# Patient Record
Sex: Male | Born: 1945
Health system: Southern US, Community
[De-identification: ages and names within clinical notes are randomized; demographics above are authoritative.]

## PROBLEM LIST (undated history)

## (undated) DIAGNOSIS — R0602 Shortness of breath: Secondary | ICD-10-CM

## (undated) DIAGNOSIS — I10 Essential (primary) hypertension: Secondary | ICD-10-CM

## (undated) DIAGNOSIS — H918X9 Other specified hearing loss, unspecified ear: Secondary | ICD-10-CM

## (undated) DIAGNOSIS — R112 Nausea with vomiting, unspecified: Secondary | ICD-10-CM

## (undated) DIAGNOSIS — IMO0001 Reserved for inherently not codable concepts without codable children: Secondary | ICD-10-CM

## (undated) DIAGNOSIS — Z9861 Coronary angioplasty status: Secondary | ICD-10-CM

## (undated) DIAGNOSIS — K5792 Diverticulitis of intestine, part unspecified, without perforation or abscess without bleeding: Secondary | ICD-10-CM

## (undated) DIAGNOSIS — K219 Gastro-esophageal reflux disease without esophagitis: Secondary | ICD-10-CM

## (undated) DIAGNOSIS — G8929 Other chronic pain: Secondary | ICD-10-CM

## (undated) DIAGNOSIS — N411 Chronic prostatitis: Secondary | ICD-10-CM

## (undated) DIAGNOSIS — R51 Headache: Secondary | ICD-10-CM

## (undated) DIAGNOSIS — Z5189 Encounter for other specified aftercare: Secondary | ICD-10-CM

## (undated) DIAGNOSIS — R519 Headache, unspecified: Secondary | ICD-10-CM

## (undated) DIAGNOSIS — Z9889 Other specified postprocedural states: Secondary | ICD-10-CM

## (undated) DIAGNOSIS — R319 Hematuria, unspecified: Secondary | ICD-10-CM

## (undated) DIAGNOSIS — I2510 Atherosclerotic heart disease of native coronary artery without angina pectoris: Secondary | ICD-10-CM

## (undated) DIAGNOSIS — C801 Malignant (primary) neoplasm, unspecified: Secondary | ICD-10-CM

## (undated) DIAGNOSIS — M1990 Unspecified osteoarthritis, unspecified site: Secondary | ICD-10-CM

## (undated) DIAGNOSIS — K458 Other specified abdominal hernia without obstruction or gangrene: Secondary | ICD-10-CM

## (undated) HISTORY — DX: Headache: R51

## (undated) HISTORY — DX: Hematuria, unspecified: R31.9

## (undated) HISTORY — DX: Other chronic pain: G89.29

## (undated) HISTORY — DX: Headache, unspecified: R51.9

## (undated) HISTORY — DX: Nausea with vomiting, unspecified: R11.2

## (undated) HISTORY — DX: Other specified abdominal hernia without obstruction or gangrene: K45.8

## (undated) HISTORY — PX: EYE SURGERY: SHX253

## (undated) HISTORY — PX: CHOLECYSTECTOMY: SHX55

## (undated) HISTORY — DX: Chronic prostatitis: N41.1

## (undated) HISTORY — PX: RECONSTRUCTION MID-FACE: SUR1085

## (undated) HISTORY — DX: Diverticulitis of intestine, part unspecified, without perforation or abscess without bleeding: K57.92

## (undated) HISTORY — DX: Other specified hearing loss, unspecified ear: H91.8X9

## (undated) HISTORY — PX: FRACTURE SURGERY: SHX138

---

## 2001-07-09 ENCOUNTER — Ambulatory Visit (HOSPITAL_COMMUNITY): Admission: RE | Admit: 2001-07-09 | Discharge: 2001-07-09 | Payer: Self-pay | Admitting: Pulmonary Disease

## 2003-12-15 ENCOUNTER — Inpatient Hospital Stay (HOSPITAL_COMMUNITY): Admission: EM | Admit: 2003-12-15 | Discharge: 2003-12-18 | Payer: Self-pay | Admitting: Emergency Medicine

## 2006-03-12 ENCOUNTER — Inpatient Hospital Stay (HOSPITAL_COMMUNITY): Admission: EM | Admit: 2006-03-12 | Discharge: 2006-03-15 | Payer: Self-pay | Admitting: *Deleted

## 2009-03-12 ENCOUNTER — Ambulatory Visit (HOSPITAL_COMMUNITY): Admission: RE | Admit: 2009-03-12 | Discharge: 2009-03-12 | Payer: Self-pay | Admitting: Pulmonary Disease

## 2009-10-13 ENCOUNTER — Ambulatory Visit (HOSPITAL_COMMUNITY): Admission: RE | Admit: 2009-10-13 | Discharge: 2009-10-13 | Payer: Self-pay | Admitting: Pulmonary Disease

## 2010-08-16 LAB — CREATININE, SERUM: GFR calc non Af Amer: >60 mL/min (ref >60)

## 2010-10-15 NOTE — Group Therapy Note (Signed)
NAME:  Brandon [name], Brandon [name]                         ACCOUNT NO.:  [id number]   MEDICAL RECORD NO.:  [id number]                   PATIENT TYPE:  INP   LOCATION:  A321                                 FACILITY:  [facility]   PHYSICIAN:  Edward L. Juanetta Gosling, M.D.             DATE OF BIRTH:  [birth year]   DATE OF PROCEDURE:  12/17/2003  DATE OF DISCHARGE:                                   PROGRESS NOTE   Brandon [name] says he feels much better this morning.  He has no headache, no  nausea or vomiting, and no chest or abdominal discomfort.  His temperature  is 97.9 degrees, pulse 78, respirations 16, blood pressure 112/75, and O2  saturation 95% on room air.  His chest is much clearer.  His abdomen is very  soft.  White count 12,900, hemoglobin 13.2.   ASSESSMENT:  He is much improved.   PLAN:  Continue with his treatments and medications.  No changes today.  I  am going to plan to check him to a heparin lock, get him up and moving  around, and then hopefully he will be able to be discharged possibly  tomorrow.  He will need a cardiac workup as an outpatient.  I do not think  he has cardiac disease and he is acutely ill.  His family is requesting an  EGD and a colonoscopy.  I think he needs to have those done, but probably  could get those done as an outpatient as well.      ___________________________________________                                            Oneal Deputy. Juanetta Gosling, M.D.   Gwenlyn Found  D:  12/17/2003  T:  12/17/2003  Job:  [id number]

## 2010-10-15 NOTE — Discharge Summary (Signed)
NAME:  Brandon [name], Brandon [name]                         ACCOUNT NO.:  [id number]   MEDICAL RECORD NO.:  [id number]                   PATIENT TYPE:  INP   LOCATION:  A321                                 FACILITY:  [facility]   PHYSICIAN:  Edward L. Juanetta Gosling, M.D.             DATE OF BIRTH:  [birth year]   DATE OF ADMISSION:  12/15/2003  DATE OF DISCHARGE:  12/18/2003                                 DISCHARGE SUMMARY   FINAL DISCHARGE DIAGNOSES:  1. Abdominal pain, etiology unclear.  2. Prostatitis with Enterococcus coli infection.   HISTORY:  This is a 65 year old male who came after a one day history of  abdominal pain, nausea and vomiting.  He said that he had been feeling  pretty well up until the day prior to admission, at which time he developed  increasing difficulties with the above symptoms and came to the emergency  room.  He actually came by my office, told us he was having chest pain and  was told to go to the emergency room because of the chest pain.  When he was  seen in the emergency room it was felt that his pain was actually more in  his abdomen than in his chest and he appeared to be uncomfortable diffusely  in his abdomen.  His family states that they have taken multiple ticks off  of him and it is not clear if this may be some sort of a tick born illness,  but he has had no rash.   PHYSICAL EXAMINATION:  GENERAL:  He appeared to have some abdominal  discomfort.  CHEST:  Clear.  HEART:  Regular.  ABDOMEN:  Soft, except as mentioned.  EXTREMITIES:  No edema.  CNS:  Grossly intact.   HOSPITAL COURSE:  His white count was noted to be about 20,000.  He had  [facility] Spotted Fever and Lyme titers that are pending.  He had  consultation with Dr. Karilyn Cota.  Culture of his urine showed E. coli.  He was  started on doxycycline because of the concern about a tick born illness and  was given Protonix.  He does have a history of gastroesophageal reflux  disease, but has not been taking  his medications regularly.  He improved  greatly over the next 48 hours.  His white blood count came back to normal.  He had no further severe abdominal pain.  It was felt that he would need to  be worked up as an outpatient with a colonoscopy, simply because he is over  50.  Also, probably an EGD.  He is going to need a cardiac evaluation, I  believe, but all of these need to be done when he is not acutely ill.  I  have explained that to the patient and his wife and they understand.   At this point he is going to be discharged home in improved condition on:  1. Levaquin 500  mg daily times seven days.  2. Doxycycline 100 mg b.i.d. times seven days.   He is going to follow-up in my office in about three days.  He is also going  to follow-up with Dr. Karilyn Cota for further testing of his gastrointestinal  tracts and I will make an appointment to see a cardiologist at some point as  well.   FINAL DISCHARGE DIAGNOSES:  1. Abdominal discomfort, etiology unclear, possibly related to     gastroesophageal reflux disease.  2. Gastroesophageal reflux disease.  3. Enterococcus coli urinary tract infection.     ___________________________________________                                         Oneal Deputy Juanetta Gosling, M.D.   ELH/MEDQ  D:  12/18/2003  T:  12/18/2003  Job:  [id number]

## 2010-10-15 NOTE — H&P (Signed)
NAME:  Brandon [name], Brandon [name] NO.:  [id number]   MEDICAL RECORD NO.:  [id number]          PATIENT TYPE:  INP   LOCATION:  A320                          FACILITY:  [facility]   PHYSICIAN:  Ky Barban, M.D.DATE OF BIRTH:  1946/02/28   DATE OF ADMISSION:  03/12/2006  DATE OF DISCHARGE:  LH                                HISTORY & PHYSICAL   CHIEF COMPLAINT:  Right testicle swelling and pain.   HISTORY OF PRESENT ILLNESS:  A 65 year old male presented to the emergency  room with pain in the right lower quadrant going into the right testicle  which is swollen and tender and low-grade fever with nausea and vomiting.  He came to the emergency room.  He is admitted with the diagnosis of right  epididymitides.  Testicle ultrasound confirms that.  He has no other  urological complaints.  Never had any kidney stones, and 25 years ago he  says he had a similar episode.   PAST MEDICAL HISTORY:  No history of diabetes or hypertension.   MEDICATIONS:  None.   FAMILY HISTORY:  No history of prostate cancer.   SOCIAL HISTORY:  He does not smoke or drink.   REVIEW OF SYSTEMS:  Unremarkable,   PHYSICAL EXAMINATION:  GENERAL:  Well-nourished, well-developed male in no  acute distress.  Fully conscious, alert, oriented.  VITAL SIGNS:  Blood pressure 130/80, temperature 100.  CNS:  No gross neurological deficits.  HEENT:  Symmetrical.  HEART:  Regular sinus rhythm.  No murmur.  ABDOMEN:  Soft, flat.  Liver, spleen, kidneys are not palpable.  No CV  tenderness.  EXTERNAL GENITALIA:  Uncircumcised.  Meatus is adequate.  Left testicle is  normal.  Right hemiscrotum is red, tender, swollen, edematous.  RECTAL:  Deferred.   IMPRESSION:  Right acute epididymitides.   RECOMMENDATIONS:  Recommend IV fluids and start him on __________ 1 g IV  b.i.d.  He cannot take Cipro and Levaquin makes him nauseated.  Urine  culture is done.  Will do CBC in the morning.  I informed the patient and  the family that this may take several weeks for this problem to settle.  As  soon as he is comfortable, we will discharge him home.  I do want to get CT  scan of the urinary abdomen/pelvis to make sure there is no pathology in the  urinary tract.      Ky Barban, M.D.  Electronically Signed     MIJ/MEDQ  D:  03/12/2006  T:  03/13/2006  Job:  [id number]
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
## 2010-10-15 NOTE — Group Therapy Note (Signed)
NAME:  Brandon [name], Brandon [name]                         ACCOUNT NO.:  [id number]   MEDICAL RECORD NO.:  [id number]                   PATIENT TYPE:  INP   LOCATION:  A321                                 FACILITY:  [facility]   PHYSICIAN:  Edward L. Juanetta Gosling, M.D.             DATE OF BIRTH:  [birth year]   DATE OF PROCEDURE:  12/18/2003  DATE OF DISCHARGE:  12/18/2003                                   PROGRESS NOTE   PROBLEM:  Abdominal discomfort, prostatitis, possible tic born disease.   SUBJECTIVE:  [name] says that he feels great and wants to go home. He  has not had any more abdominal discomfort. He is not having any fever and he  says that he feels very well and has no problems now.   PHYSICAL EXAMINATION:  CHEST:  Clear.  HEART:  Regular.  ABDOMEN:  Soft.  VITAL SIGNS:  Temperature 99.3, blood pressure 125/75, heart rate 70,  respiratory rate 20.   ASSESSMENT:  Overall, I think he is much better.   PLAN:  For him to be discharged home. Please see discharge summary for  details.      ___________________________________________                                            Oneal Deputy Juanetta Gosling, M.D.   ELH/MEDQ  D:  12/18/2003  T:  12/19/2003  Job:  [id number]

## 2010-10-15 NOTE — Discharge Summary (Signed)
NAME:  Brandon [name], Brandon [name] NO.:  [id number]   MEDICAL RECORD NO.:  [id number]          PATIENT TYPE:  INP   LOCATION:  A320                          FACILITY:  [facility]   PHYSICIAN:  Ky Barban, M.D.DATE OF BIRTH:  06-22-1945   DATE OF ADMISSION:  03/12/2006  DATE OF DISCHARGE:  10/17/2007LH                               DISCHARGE SUMMARY   This 64 year old gentleman presented in the emergency room with right  testicular swelling and pain and he was admitted with the diagnosis of  acute right epididymal orchitis.  A testicular ultrasound was done that  confirmed the diagnosis.  Patient has no other medical problem.  He was  admitted.  Routine admission workup was done.  Lab Data:  WBC count is  18.7, hematocrit is 41.9, sodium 130, potassium 3.6, chloride 96, CO2 is  20, BUN is 26, creatinine 1.3.  A urinalysis shows 7-10 WBCs, 3-6 RBCs.  Urine culture showed no growth but he was admitted and started on IV  fluids and IV Levaquin.  On admission his temperature was 102.6 and the  temperature gradually came down.  He started to feel better.  An  abdominal and pelvic CT was also done to check the upper urinary tract.  Except some inflammatory changes around the kidneys, there is no other  pathology in the urinary tract.  He started to feel better so I am going  to discharge him home.   FINAL DISCHARGE DIAGNOSIS:  Right epididymal orchitis.   He is advised to continue taking his Nexium and he is not supposed to  lift more than 20 pounds.  Gave him prescription for Bactrim DS one  b.i.d. for 10 days and morphine 30 mg p.o. q.6h. p.r.n. #30.  He is  supposed to come back and see me in the office in 2 weeks.      Ky Barban, M.D.  Electronically Signed     MIJ/MEDQ  D:  04/25/2006  T:  04/25/2006  Job:  [id number]

## 2011-04-07 ENCOUNTER — Encounter (HOSPITAL_COMMUNITY): Payer: Self-pay | Admitting: Pharmacy Technician

## 2011-04-07 NOTE — H&P (Signed)
NTS SOAP Note  Vital Signs:  Vitals as of: 04/07/2011: Systolic 117: Diastolic 77: Heart Rate 73: Temp 96.35F: Height 71ft 7in: Weight 174Lbs 0 Ounces: Pain Level 0: BMI 27  BMI : 27.25 kg/m2  Subjective: This 65 Years 2 Months old Male presents for of HERNIA: ,Has had a right inguinal hernia for over one year, but recently has increased in size and is causing him discomfort. No nausea, vomiting.  Review of Symptoms:  Constitutional: unremarkable  Head: unremarkable  Eyes:unremarkable  Nose/Mouth/Throat:unremarkable  Cardiovascular:unremarkable  Respiratory: unremarkable  Gastrointestinal:unremarkable  Genitourinary: unremarkable  Musculoskeletal: unremarkable  Skin: unremarkable  Hematolgic/Lymphatic: unremarkable  Allergic/Immunologic:unremarkable    Past Medical History:Reviewed  Past Medical History  Surgical History: lap cholecystectomy Medical Problems: High Blood pressure Allergies: nkda Medications: a BP pill, nexium   Social History: Reviewed   Social History  Preferred Language: English ([place]) Race: White Ethnicity: Not Hispanic / Latino Age: 65 Years 2 Months Marital Status: M Alcohol: No Recreational drug(s): No   Smoking Status: Never smoker reviewed on 04/07/2011  Family History: Reviewed  Family History  Is there a family history QI:ONGEXBMW, CAD    Objective Information:  General: Well appearing, well nourished in no distress.  Skin:no rash or prominent lesions  Head: Atraumatic; no masses; no abnormalities  Heart: RRR, no murmur or gallop. Normal S1, S2. No S3, S4.  Lungs:CTA bilaterally, no wheezes, rhonchi, rales. Breathing unlabored.  Abdomen: Soft,ND, normal bowel sounds, no HSM, no masses. No peritoneal signs. Reducible right inguinal hernia, weak left inguinal floor. Possible right flank hernia noted.  WNL  Assessment: Right inguinal hernia  Diagnosis & Procedure: DiagnosisCode: 550.90, ProcedureCode: 41324,     Plan:Scheduled for right inguinal herniorrhaphy 0n 04/13/11.   Patient Education: Alternative treatments to surgery were discussed with patient (and family).Risks and benefits of procedure were fully explained to the patient (and family) who gave informed consent. Patient/family questions were addressed.  Follow-up: Pending Surgery

## 2011-04-11 ENCOUNTER — Other Ambulatory Visit: Payer: Self-pay

## 2011-04-11 ENCOUNTER — Encounter (HOSPITAL_COMMUNITY): Payer: Self-pay

## 2011-04-11 ENCOUNTER — Encounter (HOSPITAL_COMMUNITY)
Admission: RE | Admit: 2011-04-11 | Discharge: 2011-04-11 | Disposition: A | Discharge status: Home / Self Care | Payer: Medicare Other | Source: Ambulatory Visit | Attending: General Surgery | Admitting: General Surgery

## 2011-04-11 HISTORY — DX: Gastro-esophageal reflux disease without esophagitis: K21.9

## 2011-04-11 HISTORY — DX: Shortness of breath: R06.02

## 2011-04-11 HISTORY — DX: Unspecified osteoarthritis, unspecified site: M19.90

## 2011-04-11 HISTORY — DX: Encounter for other specified aftercare: Z51.89

## 2011-04-11 HISTORY — DX: Reserved for inherently not codable concepts without codable children: IMO0001

## 2011-04-11 HISTORY — DX: Other specified postprocedural states: Z98.890

## 2011-04-11 HISTORY — DX: Nausea with vomiting, unspecified: R11.2

## 2011-04-11 HISTORY — DX: Essential (primary) hypertension: I10

## 2011-04-11 LAB — DIFFERENTIAL
Basophils Absolute: 0 10*3/uL (ref 0.0–0.1)
Basophils Relative: 0 % (ref 0–1)
Eosinophils Relative: 1 % (ref 0–5)
Lymphocytes Relative: 30 % (ref 12–46)
Monocytes Absolute: 0.4 10*3/uL (ref 0.1–1.0)

## 2011-04-11 LAB — BASIC METABOLIC PANEL
Calcium: 9.7 mg/dL (ref 8.4–10.5)
GFR calc Af Amer: 71 mL/min — ABNORMAL LOW (ref >90)
GFR calc non Af Amer: 61 mL/min — ABNORMAL LOW (ref >90)
Glucose, Bld: 135 mg/dL — ABNORMAL HIGH (ref 70–99)
Potassium: 4.5 mEq/L (ref 3.5–5.1)
Sodium: 137 mEq/L (ref 135–145)

## 2011-04-11 LAB — CBC
HCT: 40.4 % (ref 39.0–52.0)
MCH: 30 pg (ref 26.0–34.0)
MCHC: 33.7 g/dL (ref 30.0–36.0)
MCV: 89.2 fL (ref 78.0–100.0)
Platelets: 219 10*3/uL (ref 150–400)
RBC: 4.53 MIL/uL (ref 4.22–5.81)
WBC: 7.6 10*3/uL (ref 4.0–10.5)

## 2011-04-11 NOTE — Patient Instructions (Addendum)
20 Brandon [name]  04/11/2011   Your procedure is scheduled on:  04/13/2011  Report to [facility] at 800  AM.  Call this number if you have problems the morning of surgery: [phone]   Remember:   Do not eat food:After Midnight.  Do not drink clear liquids: After Midnight.  Take these medicines the morning of surgery with A SIP OF WATER: hyzaar,Prilosec   Do not wear jewelry, make-up or nail polish.  Do not wear lotions, powders, or perfumes. You may wear deodorant.  Do not shave 48 hours prior to surgery.  Do not bring valuables to the hospital.  Contacts, dentures or bridgework may not be worn into surgery.  Leave suitcase in the car. After surgery it may be brought to your room.  For patients admitted to the hospital, checkout time is 11:00 AM the day of discharge.   Patients discharged the day of surgery will not be allowed to drive home.  Name and phone number of your driver: son  Special Instructions: CHG Shower Use Special Wash: 1/2 bottle night before surgery and 1/2 bottle morning of surgery.   Please read over the following fact sheets that you were given: Pain Booklet, MRSA Information, Surgical Site Infection Prevention, Anesthesia Post-op Instructions and Care and Recovery After Surgery Hernia, Surgical Repair A hernia occurs when an internal organ pushes out through a weak spot in the belly (abdominal) wall muscles. Hernias commonly occur in the groin and around the navel. Hernias often can be pushed back into place (reduced). Most hernias tend to get worse over time. Problems occur when abdominal contents get stuck in the opening (incarcerated hernia). The blood supply gets cut off (strangulated hernia). This is an emergency and needs surgery. Otherwise, hernia repair can be an elective procedure. This means you can schedule this at your convenience when an emergency is not present. Because complications can occur, if you decide to repair the hernia, it is best to do it soon.  When it becomes an emergency procedure, there is increased risk of complications after surgery. CAUSES   Heavy lifting.   Obesity.   Prolonged coughing.   Straining to move your bowels.   Hernias can also occur through a cut (incision) by a surgeonafter an abdominal operation.  HOME CARE INSTRUCTIONS Before the repair:  Bed rest is not required. You may continue your normal activities, but avoid heavy lifting (more than 10 pounds) or straining. Cough gently. If you are a smoker, it is best to stop. Even the best hernia repair can break down with the continual strain of coughing.   Do not wear anything tight over your hernia. Do not try to keep it in with an outside bandage or truss. These can damage abdominal contents if they are trapped in the hernia sac.   Eat a normal diet. Avoid constipation. Straining over long periods of time to have a bowel movement will increase hernia size. It also can breakdown repairs. If you cannot do this with diet alone, laxatives or stool softeners may be used.  PRIOR TO SURGERY, SEEK IMMEDIATE MEDICAL CARE IF: You have problems (symptoms) of a trapped (incarcerated) hernia. Symptoms include:  An oral temperature above 102 F (38.9 C) develops, or as your caregiver suggests.   Increasing abdominal pain.   Feeling sick to your stomach(nausea) and vomiting.   You stop passing gas or stool.   The hernia is stuck outside the abdomen, looks discolored, feels hard, or is tender.   You  have any changes in your bowel habits or in the hernia that is unusual for you.  LET YOUR CAREGIVERS KNOW ABOUT THE FOLLOWING:  Allergies.   Medications taken including herbs, eye drops, over the counter medications, and creams.   Use of steroids (by mouth or creams).   Family or personal history of problems with anesthetics or Novocaine.   Possibility of pregnancy, if this applies.   Personal history of blood clots (thrombophlebitis).   Family or personal  history of bleeding or blood problems.   Previous surgery.   Other health problems.  BEFORE THE PROCEDURE You should be present 1 hour prior to your procedure, or as directed by your caregiver.  AFTER THE PROCEDURE After surgery, you will be taken to the recovery area. A nurse will watch and check your progress there. Once you are awake, stable, and taking fluids well, you will be allowed to go home as long as there are no problems. Once home, an ice pack (wrapped in a light towel) applied to your operative site may help with discomfort. It may also keep the swelling down. Do not lift anything heavier than 10 pounds (4.55 kilograms). Take showers not baths. Do not drive while taking narcotics. Follow instructions as suggested by your caregiver.  SEEK IMMEDIATE MEDICAL CARE IF: After surgery:  There is redness, swelling, or increasing pain in the wound.   There is pus coming from the wound.   There is drainage from a wound lasting longer than 1 day.   An unexplained oral temperature above 102 F (38.9 C) develops.   You notice a foul smell coming from the wound or dressing.   There is a breaking open of a wound (edged not staying together) after the sutures have been removed.   You notice increasing pain in the shoulders (shoulder strap areas).   You develop dizzy episodes or fainting while standing.   You develop persistent nausea or vomiting.   You develop a rash.   You have difficulty breathing.   You develop any reaction or side effects to medications given.  MAKE SURE YOU:   Understand these instructions.   Will watch your condition.   Will get help right away if you are not doing well or get worse.  Document Released: 11/09/2000 Document Revised: 01/26/2011 Document Reviewed: 10/02/2007 [facility] Patient Information 2012 [place], [place].PATIENT INSTRUCTIONS POST-ANESTHESIA  IMMEDIATELY FOLLOWING SURGERY:  Do not drive or operate machinery for the first twenty four  hours after surgery.  Do not make any important decisions for twenty four hours after surgery or while taking narcotic pain medications or sedatives.  If you develop intractable nausea and vomiting or a severe headache please notify your doctor immediately.  FOLLOW-UP:  Please make an appointment with your surgeon as instructed. You do not need to follow up with anesthesia unless specifically instructed to do so.  WOUND CARE INSTRUCTIONS (if applicable):  Keep a dry clean dressing on the anesthesia/puncture wound site if there is drainage.  Once the wound has quit draining you may leave it open to air.  Generally you should leave the bandage intact for twenty four hours unless there is drainage.  If the epidural site drains for more than 36-48 hours please call the anesthesia department.  QUESTIONS?:  Please feel free to call your physician or the hospital operator if you have any questions, and they will be happy to assist you.     [facility] Anesthesia Department [address] [place] [phone]

## 2011-04-13 ENCOUNTER — Encounter (HOSPITAL_COMMUNITY): Payer: Self-pay | Admitting: Anesthesiology

## 2011-04-13 ENCOUNTER — Encounter (HOSPITAL_COMMUNITY)
Admission: RE | Disposition: A | Discharge status: Home / Self Care | Payer: Self-pay | Source: Ambulatory Visit | Attending: General Surgery

## 2011-04-13 ENCOUNTER — Ambulatory Visit (HOSPITAL_COMMUNITY): Payer: Medicare Other | Admitting: Anesthesiology

## 2011-04-13 ENCOUNTER — Encounter (HOSPITAL_COMMUNITY): Payer: Self-pay | Admitting: *Deleted

## 2011-04-13 ENCOUNTER — Other Ambulatory Visit: Payer: Self-pay

## 2011-04-13 ENCOUNTER — Ambulatory Visit (HOSPITAL_COMMUNITY)
Admission: RE | Admit: 2011-04-13 | Discharge: 2011-04-14 | Disposition: A | Discharge status: Home / Self Care | Payer: Medicare Other | Source: Ambulatory Visit | Attending: General Surgery | Admitting: General Surgery

## 2011-04-13 DIAGNOSIS — R55 Syncope and collapse: Secondary | ICD-10-CM | POA: Insufficient documentation

## 2011-04-13 DIAGNOSIS — I10 Essential (primary) hypertension: Secondary | ICD-10-CM | POA: Insufficient documentation

## 2011-04-13 DIAGNOSIS — K4090 Unilateral inguinal hernia, without obstruction or gangrene, not specified as recurrent: Secondary | ICD-10-CM | POA: Insufficient documentation

## 2011-04-13 DIAGNOSIS — K469 Unspecified abdominal hernia without obstruction or gangrene: Secondary | ICD-10-CM

## 2011-04-13 DIAGNOSIS — Z01812 Encounter for preprocedural laboratory examination: Secondary | ICD-10-CM | POA: Insufficient documentation

## 2011-04-13 DIAGNOSIS — Z79899 Other long term (current) drug therapy: Secondary | ICD-10-CM | POA: Insufficient documentation

## 2011-04-13 HISTORY — PX: INGUINAL HERNIA REPAIR: SHX194

## 2011-04-13 SURGERY — REPAIR, HERNIA, INGUINAL, ADULT
Anesthesia: Spinal | Site: Groin | Laterality: Right | Wound class: Clean

## 2011-04-13 MED ORDER — FENTANYL CITRATE 0.05 MG/ML IJ SOLN
25.0000 ug | INTRAMUSCULAR | Status: DC | PRN
  Administered 2011-04-13: 25 ug via INTRAVENOUS
  Administered 2011-04-13: 50 ug via INTRAVENOUS

## 2011-04-13 MED ORDER — FENTANYL CITRATE 0.05 MG/ML IJ SOLN
INTRAMUSCULAR | Status: DC | PRN
  Administered 2011-04-13: 12.5 ug via INTRAVENOUS

## 2011-04-13 MED ORDER — LACTATED RINGERS IV SOLN
INTRAVENOUS | Status: DC
  Administered 2011-04-13: 17:00:00 via INTRAVENOUS
  Administered 2011-04-13: 125 mL/h via INTRAVENOUS
  Administered 2011-04-14: 11:00:00 via INTRAVENOUS
  Administered 2011-04-14: 1000 via INTRAVENOUS

## 2011-04-13 MED ORDER — FENTANYL CITRATE 0.05 MG/ML IJ SOLN
INTRAMUSCULAR | Status: AC
  Administered 2011-04-13: 25 ug via INTRAVENOUS
  Filled 2011-04-13: qty 2

## 2011-04-13 MED ORDER — FENTANYL CITRATE 0.05 MG/ML IJ SOLN
INTRAMUSCULAR | Status: AC
  Administered 2011-04-13: 50 ug via INTRAVENOUS
  Filled 2011-04-13: qty 2

## 2011-04-13 MED ORDER — DEXAMETHASONE SODIUM PHOSPHATE 4 MG/ML IJ SOLN
INTRAMUSCULAR | Status: AC
  Administered 2011-04-13: 4 mg via INTRAVENOUS
  Filled 2011-04-13: qty 1

## 2011-04-13 MED ORDER — ONDANSETRON HCL 4 MG/2ML IJ SOLN: 4.0000 mg | Freq: Four times a day (QID) | INTRAMUSCULAR | Status: DC | PRN

## 2011-04-13 MED ORDER — BUPIVACAINE HCL (PF) 0.5 % IJ SOLN
INTRAMUSCULAR | Status: AC
  Filled 2011-04-13: qty 30

## 2011-04-13 MED ORDER — BUPIVACAINE HCL (PF) 0.5 % IJ SOLN
INTRAMUSCULAR | Status: DC | PRN
  Administered 2011-04-13: 10 mL

## 2011-04-13 MED ORDER — MORPHINE SULFATE 2 MG/ML IJ SOLN
2.0000 mg | INTRAMUSCULAR | Status: DC | PRN
  Administered 2011-04-13: 2 mg via INTRAVENOUS
  Filled 2011-04-13: qty 1

## 2011-04-13 MED ORDER — BUPIVACAINE IN DEXTROSE 0.75-8.25 % IT SOLN
INTRATHECAL | Status: AC
  Filled 2011-04-13: qty 2

## 2011-04-13 MED ORDER — EPHEDRINE SULFATE 50 MG/ML IJ SOLN
INTRAMUSCULAR | Status: AC
  Filled 2011-04-13: qty 1

## 2011-04-13 MED ORDER — DEXAMETHASONE SODIUM PHOSPHATE 4 MG/ML IJ SOLN
4.0000 mg | INTRAMUSCULAR | Status: AC
  Administered 2011-04-13: 4 mg via INTRAVENOUS

## 2011-04-13 MED ORDER — LACTATED RINGERS IV SOLN
INTRAVENOUS | Status: DC
  Administered 2011-04-13: 09:00:00 via INTRAVENOUS

## 2011-04-13 MED ORDER — PROPOFOL 10 MG/ML IV EMUL
INTRAVENOUS | Status: AC
  Filled 2011-04-13: qty 20

## 2011-04-13 MED ORDER — ONDANSETRON HCL 4 MG PO TABS
4.0000 mg | ORAL_TABLET | Freq: Four times a day (QID) | ORAL | Status: DC | PRN
  Administered 2011-04-13: 4 mg via ORAL
  Filled 2011-04-13 (×2): qty 1

## 2011-04-13 MED ORDER — ONDANSETRON HCL 4 MG/2ML IJ SOLN: 4.0000 mg | Freq: Once | INTRAMUSCULAR | Status: DC | PRN

## 2011-04-13 MED ORDER — CEFAZOLIN SODIUM 1-5 GM-% IV SOLN
INTRAVENOUS | Status: AC
  Filled 2011-04-13: qty 50

## 2011-04-13 MED ORDER — ENOXAPARIN SODIUM 40 MG/0.4ML ~~LOC~~ SOLN
SUBCUTANEOUS | Status: AC
  Administered 2011-04-13: 40 mg via SUBCUTANEOUS
  Filled 2011-04-13: qty 0.4

## 2011-04-13 MED ORDER — KETOROLAC TROMETHAMINE 15 MG/ML IJ SOLN
15.0000 mg | Freq: Four times a day (QID) | INTRAMUSCULAR | Status: DC | PRN
  Filled 2011-04-13: qty 1

## 2011-04-13 MED ORDER — FENTANYL CITRATE 0.05 MG/ML IJ SOLN
INTRAMUSCULAR | Status: AC
  Filled 2011-04-13: qty 2

## 2011-04-13 MED ORDER — MIDAZOLAM HCL 2 MG/2ML IJ SOLN
1.0000 mg | INTRAMUSCULAR | Status: DC | PRN
  Administered 2011-04-13: 2 mg via INTRAVENOUS

## 2011-04-13 MED ORDER — ENOXAPARIN SODIUM 40 MG/0.4ML ~~LOC~~ SOLN
40.0000 mg | SUBCUTANEOUS | Status: DC
  Administered 2011-04-13: 40 mg via SUBCUTANEOUS

## 2011-04-13 MED ORDER — ENOXAPARIN SODIUM 40 MG/0.4ML ~~LOC~~ SOLN
40.0000 mg | Freq: Once | SUBCUTANEOUS | Status: AC
  Administered 2011-04-13: 40 mg via SUBCUTANEOUS

## 2011-04-13 MED ORDER — ONDANSETRON HCL 4 MG/2ML IJ SOLN
INTRAMUSCULAR | Status: AC
  Filled 2011-04-13: qty 2

## 2011-04-13 MED ORDER — PANTOPRAZOLE SODIUM 40 MG PO TBEC
40.0000 mg | DELAYED_RELEASE_TABLET | Freq: Every day | ORAL | Status: DC
  Administered 2011-04-13 – 2011-04-14 (×2): 40 mg via ORAL
  Filled 2011-04-13 (×5): qty 1

## 2011-04-13 MED ORDER — FENTANYL CITRATE 0.05 MG/ML IJ SOLN
INTRAMUSCULAR | Status: DC | PRN
  Administered 2011-04-13: 12.5 ug via INTRATHECAL

## 2011-04-13 MED ORDER — LIDOCAINE HCL (PF) 1 % IJ SOLN
INTRAMUSCULAR | Status: AC
  Filled 2011-04-13: qty 5

## 2011-04-13 MED ORDER — CEFAZOLIN SODIUM 1-5 GM-% IV SOLN
1.0000 g | INTRAVENOUS | Status: AC
  Administered 2011-04-13: 1 g via INTRAVENOUS

## 2011-04-13 MED ORDER — PROPOFOL 10 MG/ML IV EMUL
INTRAVENOUS | Status: DC | PRN
  Administered 2011-04-13: 50 ug/kg/min via INTRAVENOUS

## 2011-04-13 MED ORDER — BUPIVACAINE IN DEXTROSE 0.75-8.25 % IT SOLN
INTRATHECAL | Status: DC | PRN
  Administered 2011-04-13: 15 mg via INTRATHECAL

## 2011-04-13 MED ORDER — HYDROCODONE-ACETAMINOPHEN 5-325 MG PO TABS: 1.0000 | ORAL_TABLET | ORAL | Status: DC | PRN

## 2011-04-13 MED ORDER — HYDROCODONE-ACETAMINOPHEN 5-325 MG PO TABS
1.0000 | ORAL_TABLET | ORAL | Status: DC | PRN
  Administered 2011-04-13: 1 via ORAL
  Filled 2011-04-13: qty 1

## 2011-04-13 MED ORDER — ONDANSETRON HCL 4 MG/2ML IJ SOLN
4.0000 mg | Freq: Once | INTRAMUSCULAR | Status: AC
  Administered 2011-04-13: 4 mg via INTRAVENOUS

## 2011-04-13 MED ORDER — EPHEDRINE SULFATE 50 MG/ML IJ SOLN
INTRAMUSCULAR | Status: DC | PRN
  Administered 2011-04-13: 5 mg via INTRAVENOUS

## 2011-04-13 MED ORDER — SODIUM CHLORIDE 0.9 % IR SOLN
Status: DC | PRN
  Administered 2011-04-13: 1000 mL

## 2011-04-13 MED ORDER — KETOROLAC TROMETHAMINE 30 MG/ML IJ SOLN
INTRAMUSCULAR | Status: AC
  Filled 2011-04-13: qty 1

## 2011-04-13 MED ORDER — MIDAZOLAM HCL 2 MG/2ML IJ SOLN
INTRAMUSCULAR | Status: AC
  Filled 2011-04-13: qty 2

## 2011-04-13 MED ORDER — KETOROLAC TROMETHAMINE 30 MG/ML IJ SOLN: 30.0000 mg | Freq: Once | INTRAMUSCULAR | Status: DC

## 2011-04-13 SURGICAL SUPPLY — 41 items
ADH SKN CLS APL DERMABOND .7 (GAUZE/BANDAGES/DRESSINGS) ×1
BAG HAMPER (MISCELLANEOUS) ×2 IMPLANT
CLOTH BEACON ORANGE TIMEOUT ST (SAFETY) ×2 IMPLANT
COVER LIGHT HANDLE STERIS (MISCELLANEOUS) ×4 IMPLANT
DECANTER SPIKE VIAL GLASS SM (MISCELLANEOUS) ×2 IMPLANT
DERMABOND ADVANCED (GAUZE/BANDAGES/DRESSINGS) ×1
DERMABOND ADVANCED .7 DNX12 (GAUZE/BANDAGES/DRESSINGS) ×1 IMPLANT
DRAIN PENROSE 18X.75 LTX STRL (MISCELLANEOUS) ×2 IMPLANT
ELECT REM PT RETURN 9FT ADLT (ELECTROSURGICAL) ×2
ELECTRODE REM PT RTRN 9FT ADLT (ELECTROSURGICAL) ×1 IMPLANT
FORMALIN 10 PREFIL 120ML (MISCELLANEOUS) IMPLANT
GLOVE BIO SURGEON STRL SZ7.5 (GLOVE) ×2 IMPLANT
GLOVE ECLIPSE 6.5 STRL STRAW (GLOVE) ×1 IMPLANT
GLOVE ECLIPSE 7.0 STRL STRAW (GLOVE) ×1 IMPLANT
GLOVE EXAM NITRILE MD LF STRL (GLOVE) ×1 IMPLANT
GLOVE INDICATOR 7.0 STRL GRN (GLOVE) ×1 IMPLANT
GOWN STRL REIN XL XLG (GOWN DISPOSABLE) ×6 IMPLANT
INST SET MINOR GENERAL (KITS) ×2 IMPLANT
KIT ROOM TURNOVER APOR (KITS) ×2 IMPLANT
MANIFOLD NEPTUNE II (INSTRUMENTS) ×2 IMPLANT
MESH MARLEX PLUG MEDIUM (Mesh General) ×1 IMPLANT
NDL HYPO 25X1 1.5 SAFETY (NEEDLE) ×1 IMPLANT
NEEDLE HYPO 25X1 1.5 SAFETY (NEEDLE) ×2 IMPLANT
NS IRRIG 1000ML POUR BTL (IV SOLUTION) ×2 IMPLANT
PACK MINOR (CUSTOM PROCEDURE TRAY) ×2 IMPLANT
PAD ARMBOARD 7.5X6 YLW CONV (MISCELLANEOUS) ×2 IMPLANT
PAIN PUMP ON-Q 100MLX2ML 2.5IN (PAIN MANAGEMENT) IMPLANT
SET BASIN LINEN APH (SET/KITS/TRAYS/PACK) ×2 IMPLANT
SOL PREP PROV IODINE SCRUB 4OZ (MISCELLANEOUS) ×2 IMPLANT
SUT NOVA NAB GS-22 2 2-0 T-19 (SUTURE) IMPLANT
SUT NOVAFIL NAB HGS22 2-0 30IN (SUTURE) ×3 IMPLANT
SUT SILK 3 0 (SUTURE)
SUT SILK 3-0 18XBRD TIE 12 (SUTURE) ×1 IMPLANT
SUT VIC AB 2-0 CT1 27 (SUTURE) ×2
SUT VIC AB 2-0 CT1 TAPERPNT 27 (SUTURE) ×1 IMPLANT
SUT VIC AB 3-0 SH 27 (SUTURE) ×2
SUT VIC AB 3-0 SH 27X BRD (SUTURE) ×1 IMPLANT
SUT VIC AB 4-0 PS2 27 (SUTURE) ×2 IMPLANT
SUT VICRYL AB 3 0 TIES (SUTURE) ×1 IMPLANT
SYR CONTROL 10ML LL (SYRINGE) ×2 IMPLANT
TOWEL OR 17X26 4PK STRL BLUE (TOWEL DISPOSABLE) ×1 IMPLANT

## 2011-04-13 NOTE — Anesthesia Procedure Notes (Addendum)
Date/Time: 04/13/2011 9:44 AM Performed by: Minerva Areola Pre-anesthesia Checklist: Patient identified, Emergency Drugs available, Suction available, Patient being monitored and Timeout performed Patient Re-evaluated:Patient Re-evaluated prior to inductionOxygen Delivery Method: Nasal Cannula    Spinal  Patient location during procedure: OR Start time: 04/13/2011 9:53 AM End time: 04/13/2011 9:55 AM Staffing CRNA/Resident: Minerva Areola Preanesthetic Checklist Completed: patient identified, site marked, surgical consent, pre-op evaluation, timeout performed, IV checked, risks and benefits discussed and monitors and equipment checked Spinal Block Patient position: right lateral decubitus Prep: Betadine and prep x 3 Patient monitoring: heart rate, cardiac monitor, continuous pulse ox and blood pressure Approach: right paramedian (1 % lidocaine 1 cc skinwheal) Location: L3-4 Injection technique: single-shot Needle Needle type: Sprotte  Needle gauge: 24 G Needle length: 9 cm Assessment Sensory level: T8 (level at 1002) Events: clear CSF  pre and post injection, freeflowing Additional Notes Skin localization 1% Lidocaine Marcaine 15 mg/ Fentanyl 12.5 mcg Tray Lot [id number] Tray expiration: 2013-09

## 2011-04-13 NOTE — Anesthesia Preprocedure Evaluation (Addendum)
Anesthesia Evaluation  Patient identified by MRN, date of birth, ID band Patient awake    Reviewed: Allergy & Precautions, H&P , NPO status , Patient's Chart, lab work & pertinent test results  History of Anesthesia Complications (+) PONV  Airway Mallampati: II      Dental  (+) Edentulous Upper and Partial Lower   Pulmonary shortness of breath and with exertion,    Pulmonary exam normal       Cardiovascular hypertension, Pt. on medications Regular Normal    Neuro/Psych    GI/Hepatic GERD-  Medicated and Controlled,  Endo/Other    Renal/GU      Musculoskeletal   Abdominal   Peds  Hematology   Anesthesia Other Findings   Reproductive/Obstetrics                          Anesthesia Physical Anesthesia Plan  ASA: II  Anesthesia Plan: Spinal   Post-op Pain Management:    Induction:   Airway Management Planned: Nasal Cannula  Additional Equipment:   Intra-op Plan:   Post-operative Plan:   Informed Consent: I have reviewed the patients History and Physical, chart, labs and discussed the procedure including the risks, benefits and alternatives for the proposed anesthesia with the patient or authorized representative who has indicated his/her understanding and acceptance.     Plan Discussed with:   Anesthesia Plan Comments:         Anesthesia Quick Evaluation

## 2011-04-13 NOTE — Progress Notes (Signed)
1545  Pt sitting in wheelchair and became nauseated and vomited small amount of thick brownish material.  Pt became clammy pale  and passed out.  Placed on 100% oxygen and placed on monitor.  VSS.  BP103/74  Hr 62 and thready RR  16 and sats 99%.  Pt responding after being placed on oxygen. Answering questions appropriately byt remains clammy.  Placed on stretcher and EKG performed for further evaluation.

## 2011-04-13 NOTE — Interval H&P Note (Signed)
History and Physical Interval Note:   04/13/2011   9:29 AM   Brandon [name]  has presented today for surgery, with the diagnosis of Hernia, inguinal [550.90]  The various methods of treatment have been discussed with the patient and family. After consideration of risks, benefits and other options for treatment, the patient has consented to  Procedure(s): HERNIA REPAIR INGUINAL ADULT as a surgical intervention .  The patients' history has been reviewed, patient examined, no change in status, stable for surgery.  I have reviewed the patients' chart and labs.  Questions were answered to the patient's satisfaction.     Dalia Heading  MD

## 2011-04-13 NOTE — Progress Notes (Addendum)
Patient had an episode of passing out while sitting in chair in the postoperative area. He states he felt nauseated and passed out. His vital signs were stable. He 12-lead EKG was done which revealed no acute changes. He states then other than feeling lightheaded, he has no chest pain. He was placed on nonrebreather and his oxygen saturation was at 100%. Physical examination is unremarkable.  The patient be admitted the hospital for observation do to this apparent vasovagal episode. Anticipate discharge in next 24 hours.  Report called to Sophronia Simas RN.  Patient Transferred to rm [id number] via st

## 2011-04-13 NOTE — Anesthesia Postprocedure Evaluation (Signed)
Anesthesia Post Note  Patient: Brandon [name]  Procedure(s) Performed:  HERNIA REPAIR INGUINAL ADULT  Anesthesia type: Spinal  Patient location: PACU  Post pain: Pain level controlled  Post assessment: Post-op Vital signs reviewed, Patient's Cardiovascular Status Stable, Respiratory Function Stable, Patent Airway, No signs of Nausea or vomiting and Pain level controlled  Last Vitals:  Filed Vitals:   04/13/11 1053  BP: 99/61  Pulse: 60  Temp: 36.4 C  Resp: 13    Post vital signs: Reviewed and stable  Level of consciousness: awake and alert   Complications: No apparent anesthesia complications

## 2011-04-13 NOTE — Transfer of Care (Addendum)
Immediate Anesthesia Transfer of Care Note  Patient: Brandon [name]  Procedure(s) Performed:  HERNIA REPAIR INGUINAL ADULT  Patient Location: PACU  Anesthesia Type: SAB  Level of Consciousness: awake  Airway & Oxygen Therapy: Patient Spontanous Breathing Nasal cannula  Post-op Assessment: Report given to PACU RN, Post -op Vital signs reviewed and stable. SAB Level  T 10  Post vital signs: Reviewed and stable  Complications: No apparent anesthesia complications

## 2011-04-13 NOTE — Op Note (Signed)
Patient:  [name]  DOB:  11/22/1945  MRN:  [id number]   Preop Diagnosis:  Right inguinal hernia  Postop Diagnosis:  Same  Procedure:  Right inguinal herniorrhaphy  Surgeon:  Franky Macho, M.D.  Anes:  Spinal  Indications:  Patient is a 65 year old white male presents with a symptomatic right inguinal hernia. The risks and benefits of the procedure including bleeding, infection, chronic pain, and recurrence of the hernia were fully explained to the patient, gave informed consent.  Procedure note:  Patient is placed the supine position. After spinal anesthesia was a minister, the right groin region was prepped and draped using the usual sterile technique with Betadine. Surgical site confirmation was performed.  A transverse incision was made in the right groin region down to the external oblique aponeurosis. The aponeurosis was incised to the external ring. A Penrose drain was placed from the spermatic cord. The vase deferens was noted within the spermatic cord. The ilioinguinal nerve was identified retracted superiorly from the operative field. An indirect hernia sac was found. This was freed away and reduced at its peritoneal reflection. The patient also had a large lipoma of the cord. This was excised at the peritoneal layer and disposed of. A medium-sized proceed plug was then inserted into the indirect hernia region secured to the transversalis fascia using a 2-0 Novafil interrupted suture. An onlay patch was then placed along the floor of the inguinal canal and secured superiorly to the conjoined tendon and inferiorly to the shelving edge of Poupart's ligament using 2-0 Novafil interrupted sutures. The internal ring was recreated using a 2-0 Novafil interrupted suture. The external oblique aponeurosis reapproximated using a 2-0 running Vicryl suture. The subcutaneous layer was reapproximated using 3-0 Vicryl interrupted suture. The subcutaneous layer was reapproximated using a 4-0  Vicryl subcuticular suture. 0.5% Sensorcaine was instilled the surrounding wound. Dermabond was then applied.  All tape and needle counts were correct at the end of the procedure. Patient was transferred to PACU in stable condition.  Complications:  None  EBL:  Minimal  Specimen:  None

## 2011-04-14 LAB — GLUCOSE, CAPILLARY: Glucose-Capillary: 141 mg/dL — ABNORMAL HIGH (ref 70–99)

## 2011-04-14 NOTE — Discharge Summary (Signed)
Physician Discharge Summary  Patient ID: Brandon [name] MRN: [id number] DOB/AGE: 09/20/1945 65 y.o.  Admit date: 04/13/2011 Discharge date: 04/14/2011  Admission Diagnoses:  Syncopal episode, s/p right inguinal herniorrhaphy  Discharge Diagnoses: Same Active Problems:  * No active hospital problems. *    Discharged Condition: good  Hospital Course: Patient is a 65 year old white male who underwent an uneventful right inguinal herniorrhaphy under spinal anesthesia yesterday. While in the postoperative area, he had a syncopal episode where he passed out. His vital signs were stable. 12-lead EKG showed no acute changes. He was admitted overnight for observation.  His hospital course has been unremarkable. He has had no further syncopal episodes. I suspect that this was secondary to a vasovagal response from the spinal anesthetic and relative hypovolemia.  Consults: none  Treatments: surgery: Right inguinal herniorrhaphy on 04/13/2011  Discharge Exam: Blood pressure 106/71, pulse 56, temperature 97.9 F (36.6 C), temperature source Oral, resp. rate 20, height 5\' 8"  (1.727 m), weight 79.4 kg (175 lb 0.7 oz), SpO2 96.00%. General appearance: alert, cooperative and no distress Resp: clear to auscultation bilaterally Cardio: regular rate and rhythm, S1, S2 normal, no murmur, click, rub or gallop GI: Soft, flat. Right inguinal incision healing well.  Disposition:  home  Discharge Orders    Future Orders Please Complete By Expires   Ice pack        Current Discharge Medication List    CONTINUE these medications which have CHANGED   Details  HYDROcodone-acetaminophen (NORCO) 5-325 MG per tablet Take 1 tablet by mouth every 4 (four) hours as needed for pain. For pain Qty: 40 tablet, Refills: 0      CONTINUE these medications which have NOT CHANGED   Details  ibuprofen (ADVIL,MOTRIN) 200 MG tablet Take 400 mg by mouth every 6 (six) hours as needed. For pain       losartan-hydrochlorothiazide (HYZAAR) 100-12.5 MG per tablet Take 1 tablet by mouth daily.  Hold for several days.    omeprazole (PRILOSEC) 20 MG capsule Take 20 mg by mouth daily.         Follow-up Information    Follow up with Seher Schlagel A. Make an appointment on 04/19/2011.   Contact information:   [address] [place] [address] [phone]          Signed: Dalia Heading 04/14/2011, 12:45 PM

## 2011-04-14 NOTE — Progress Notes (Signed)
UR Chart Review Completed  

## 2011-04-19 ENCOUNTER — Encounter (HOSPITAL_COMMUNITY): Payer: Self-pay | Admitting: General Surgery

## 2011-06-30 DIAGNOSIS — G44209 Tension-type headache, unspecified, not intractable: Secondary | ICD-10-CM | POA: Diagnosis not present

## 2011-06-30 DIAGNOSIS — G4730 Sleep apnea, unspecified: Secondary | ICD-10-CM | POA: Diagnosis not present

## 2011-06-30 DIAGNOSIS — G43909 Migraine, unspecified, not intractable, without status migrainosus: Secondary | ICD-10-CM | POA: Diagnosis not present

## 2011-07-08 ENCOUNTER — Ambulatory Visit: Payer: Medicare Other | Attending: Pulmonary Disease

## 2011-07-08 DIAGNOSIS — G4733 Obstructive sleep apnea (adult) (pediatric): Secondary | ICD-10-CM | POA: Insufficient documentation

## 2011-07-08 DIAGNOSIS — G4730 Sleep apnea, unspecified: Secondary | ICD-10-CM | POA: Diagnosis not present

## 2011-07-08 DIAGNOSIS — G4710 Hypersomnia, unspecified: Secondary | ICD-10-CM | POA: Diagnosis not present

## 2011-07-12 NOTE — Procedures (Signed)
NAME:  Brandon [name], Brandon [name]               ACCOUNT NO.:  [id number]  MEDICAL RECORD NO.:  [id number]          PATIENT TYPE:  OUT  LOCATION:  SLEEP LAB                     FACILITY:  [facility]  PHYSICIAN:  Lessly Stigler A. Gerilyn Pilgrim, M.D. DATE OF BIRTH:  [birth year]  DATE OF STUDY:  07/08/2011                           NOCTURNAL POLYSOMNOGRAM  REFERRING PHYSICIAN:  Ramon Dredge L. Juanetta Gosling, M.D.  REFERRING PHYSICIAN:  Edward L. Juanetta Gosling, MD  INDICATIONS:  A 66 year old man who presents with fatigue, snoring, and hypersomnia.  The study is being done to evaluate for obstructive sleep apnea syndrome.  EPWORTH SLEEPINESS SCALE:1. BMI is 26.  ARCHITECTURAL SUMMARY:  The total recording time is 471 minute.  Sleep efficiency 87%.  Sleep latency 60 minutes.  REM latency 80 minutes. Stage N1 16%, N2 65%, N3 1% and the REM sleep 18%.  RESPIRATORY SUMMARY:  Baseline oxygen saturation 94, lowest saturation 81 during REM sleep.  Diagnostic AHI 6 and RDI 22.  The events occurred mostly during the REM sleep with the REM AHI being 19.  LIMB MOVEMENT SUMMARY:  PLM index 0.  ELECTROCARDIOGRAM SUMMARY:  Average heart rate is 58 with no significant dysrhythmias observed.  IMPRESSION:  Mild REM-related obstructive sleep apnea syndrome, not requiring positive pressure treatment.  Thank you for this referral.    Canesha Tesfaye A. Gerilyn Pilgrim, M.D.    KAD/MEDQ  D:  07/11/2011 09:08:36  T:  07/12/2011 04:10:18  Job:  [id number]

## 2011-08-23 DIAGNOSIS — R109 Unspecified abdominal pain: Secondary | ICD-10-CM | POA: Diagnosis not present

## 2011-08-23 DIAGNOSIS — R0602 Shortness of breath: Secondary | ICD-10-CM | POA: Diagnosis not present

## 2011-08-23 DIAGNOSIS — G44209 Tension-type headache, unspecified, not intractable: Secondary | ICD-10-CM | POA: Diagnosis not present

## 2011-12-22 DIAGNOSIS — H2010 Chronic iridocyclitis, unspecified eye: Secondary | ICD-10-CM | POA: Diagnosis not present

## 2011-12-22 DIAGNOSIS — I10 Essential (primary) hypertension: Secondary | ICD-10-CM | POA: Diagnosis not present

## 2012-05-28 DIAGNOSIS — R5383 Other fatigue: Secondary | ICD-10-CM | POA: Diagnosis not present

## 2012-05-28 DIAGNOSIS — J45901 Unspecified asthma with (acute) exacerbation: Secondary | ICD-10-CM | POA: Diagnosis not present

## 2012-05-28 DIAGNOSIS — I10 Essential (primary) hypertension: Secondary | ICD-10-CM | POA: Diagnosis not present

## 2012-05-28 DIAGNOSIS — G44209 Tension-type headache, unspecified, not intractable: Secondary | ICD-10-CM | POA: Diagnosis not present

## 2012-05-28 DIAGNOSIS — J209 Acute bronchitis, unspecified: Secondary | ICD-10-CM | POA: Diagnosis not present

## 2012-05-28 DIAGNOSIS — J441 Chronic obstructive pulmonary disease with (acute) exacerbation: Secondary | ICD-10-CM | POA: Diagnosis not present

## 2012-06-18 DIAGNOSIS — R079 Chest pain, unspecified: Secondary | ICD-10-CM | POA: Diagnosis not present

## 2012-06-18 DIAGNOSIS — R0602 Shortness of breath: Secondary | ICD-10-CM | POA: Diagnosis not present

## 2012-06-18 DIAGNOSIS — I10 Essential (primary) hypertension: Secondary | ICD-10-CM | POA: Diagnosis not present

## 2012-06-18 DIAGNOSIS — M1990 Unspecified osteoarthritis, unspecified site: Secondary | ICD-10-CM | POA: Diagnosis not present

## 2012-06-20 ENCOUNTER — Encounter: Payer: Self-pay | Admitting: *Deleted

## 2012-06-21 ENCOUNTER — Ambulatory Visit (INDEPENDENT_AMBULATORY_CARE_PROVIDER_SITE_OTHER): Payer: Medicare Other | Admitting: Cardiovascular Disease

## 2012-06-21 ENCOUNTER — Ambulatory Visit (HOSPITAL_COMMUNITY)
Admission: RE | Admit: 2012-06-21 | Discharge: 2012-06-21 | Disposition: A | Discharge status: Home / Self Care | Payer: Medicare Other | Source: Ambulatory Visit | Attending: Pulmonary Disease | Admitting: Pulmonary Disease

## 2012-06-21 VITALS — BP 142/80 | HR 68 | Ht 68.0 in | Wt 176.0 lb

## 2012-06-21 DIAGNOSIS — R0609 Other forms of dyspnea: Secondary | ICD-10-CM | POA: Insufficient documentation

## 2012-06-21 DIAGNOSIS — R0989 Other specified symptoms and signs involving the circulatory and respiratory systems: Secondary | ICD-10-CM | POA: Insufficient documentation

## 2012-06-21 DIAGNOSIS — R079 Chest pain, unspecified: Secondary | ICD-10-CM

## 2012-06-21 DIAGNOSIS — R5383 Other fatigue: Secondary | ICD-10-CM

## 2012-06-21 DIAGNOSIS — R5381 Other malaise: Secondary | ICD-10-CM

## 2012-06-21 DIAGNOSIS — R0602 Shortness of breath: Secondary | ICD-10-CM

## 2012-06-21 DIAGNOSIS — Z136 Encounter for screening for cardiovascular disorders: Secondary | ICD-10-CM

## 2012-06-21 DIAGNOSIS — R0600 Dyspnea, unspecified: Secondary | ICD-10-CM | POA: Insufficient documentation

## 2012-06-21 HISTORY — DX: Chest pain, unspecified: R07.9

## 2012-06-21 HISTORY — DX: Shortness of breath: R06.02

## 2012-06-21 HISTORY — DX: Other fatigue: R53.83

## 2012-06-21 NOTE — Progress Notes (Signed)
*  PRELIMINARY RESULTS* Echocardiogram 2D Echocardiogram has been performed.  Brandon [name] 06/21/2012, 1:00 PM

## 2012-06-21 NOTE — Assessment & Plan Note (Signed)
Functional no clear etiology complete other lab work with Dr Juanetta Gosling EF normal

## 2012-06-21 NOTE — Patient Instructions (Addendum)
Your physician recommends that you schedule a follow-up appointment in: As Needed  Treadmill Stress Test

## 2012-06-21 NOTE — Progress Notes (Signed)
Patient ID: Brandon [name], male   DOB: 11-15-1945, 67 y.o.   MRN: [id number] 67 yo referred for dyspnea, fatigue and atypical chest pain.  Dyspnea and fatigue have been coming on for years.  Atypical sharp stabbing left sided pain last month or so is new.  No positional component.  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  Spirometry normal and no chronic lung disease.  No history of CAD.  Reviewed his echo from today and normal with no valve disease EF 55-60% and estimated PA pressure 28  No pericardial effusion  ROS: Denies fever, malais, weight loss, blurry vision, decreased visual acuity, cough, sputum, SOB, hemoptysis, pleuritic pain, palpitaitons, heartburn, abdominal pain, melena, lower extremity edema, claudication, or rash.  All other systems reviewed and negative   General: Affect appropriate Healthy:  appears stated age HEENT: normal Neck supple with no adenopathy JVP normal no bruits no thyromegaly Lungs clear with no wheezing and good diaphragmatic motion Heart:  S1/S2 no murmur,rub, gallop or click PMI normal Abdomen: benighn, BS positve, no tenderness, no AAA no bruit.  No HSM or HJR Distal pulses intact with no bruits No edema Neuro non-focal Skin warm and dry No muscular weakness  Medications Current Outpatient Prescriptions  Medication Sig Dispense Refill  . HYDROcodone-acetaminophen (NORCO) 5-325 MG per tablet Take 1 tablet by mouth every 4 (four) hours as needed for pain. For pain  40 tablet  0  . ibuprofen (ADVIL,MOTRIN) 200 MG tablet Take 400 mg by mouth every 6 (six) hours as needed. For pain       . omeprazole (PRILOSEC) 20 MG capsule Take 20 mg by mouth daily.        . traMADol (ULTRAM) 50 MG tablet Take 50 mg by mouth every 6 (six) hours as needed.        Allergies Ciprofloxacin  Family History: No family history on file.  Social History: History   Social History  . Marital Status: Married    Spouse Name: N/A   Number of Children: N/A  . Years of Education: N/A   Occupational History  . Not on file.   Social History Main Topics  . Smoking status: Former Smoker -- 2.0 packs/day for 13 years    Types: Cigarettes    Quit date: 04/10/1969  . Smokeless tobacco: Not on file  . Alcohol Use: No  . Drug Use: No  . Sexually Active: Not on file   Other Topics Concern  . Not on file   Social History Narrative  . No narrative on file    Electrocardiogram:  ECG normal Dr Juanetta Gosling office and no change from ECG in 2012 no pericardial changes  Assessment and Plan

## 2012-06-21 NOTE — Assessment & Plan Note (Signed)
No obvious etiology EF normal normal spirometry and estimated PA pressure on echo nomral

## 2012-06-21 NOTE — Assessment & Plan Note (Signed)
Atypical with normal ECG with fatigue and dyspnea will have him walk on treadmill to document MET level achievable and R/O CAD

## 2012-06-26 DIAGNOSIS — N390 Urinary tract infection, site not specified: Secondary | ICD-10-CM | POA: Diagnosis not present

## 2012-06-26 DIAGNOSIS — N419 Inflammatory disease of prostate, unspecified: Secondary | ICD-10-CM | POA: Diagnosis not present

## 2012-06-28 ENCOUNTER — Encounter (HOSPITAL_COMMUNITY): Payer: Medicare Other

## 2012-06-28 ENCOUNTER — Other Ambulatory Visit (HOSPITAL_COMMUNITY): Payer: Medicare Other

## 2012-07-06 ENCOUNTER — Ambulatory Visit (HOSPITAL_COMMUNITY)
Admission: RE | Admit: 2012-07-06 | Discharge: 2012-07-06 | Disposition: A | Discharge status: Home / Self Care | Payer: Medicare Other | Source: Ambulatory Visit | Attending: Cardiovascular Disease | Admitting: Cardiovascular Disease

## 2012-07-06 DIAGNOSIS — R0609 Other forms of dyspnea: Secondary | ICD-10-CM | POA: Insufficient documentation

## 2012-07-06 DIAGNOSIS — R0602 Shortness of breath: Secondary | ICD-10-CM

## 2012-07-06 DIAGNOSIS — R0989 Other specified symptoms and signs involving the circulatory and respiratory systems: Secondary | ICD-10-CM | POA: Insufficient documentation

## 2012-07-06 DIAGNOSIS — Z136 Encounter for screening for cardiovascular disorders: Secondary | ICD-10-CM

## 2012-07-06 NOTE — Progress Notes (Addendum)
Stress Lab Nurses Notes - Brandon [name]  Brandon [name] 07/06/2012 Reason for doing test: Dyspnea Type of test: Regular GTX Nurse performing test: Parke Poisson, RN Nuclear Medicine Tech: Not Applicable Echo Tech: Not Applicable MD performing test: R. Dietrich Pates Family MD: Juanetta Gosling Test explained and consent signed: yes IV started: No IV started Symptoms: SOB & Fatigue. O2 Sat 94-96% Treatment/Intervention: None Reason test stopped: fatigue and reached target HR After recovery IV was: NA Patient to return to Nuc. Med at :NA Patient discharged: Home Patient's Condition upon discharge was: stable Comments: During test peak BP 165/99 & HR 146.  Recovery BP 125/91 & HR 71. Checked O2 Sat 94-96% while on TM.  Symptoms resolved in recovery. [name]  Graded Exercise Test-Interpretation      Graded exercise performed to a work load of 8.3 METs and a heart rate of 148, 97% of age-predicted maximum.  Exercise discontinued due to dyspnea and fatigue; no chest discomfort reported.  Blood pressure increased from a resting value of 115/90 to 165/100 at peak exercise, a normal response.  No arrhythmias noted.  EKG: normal sinus rhythm; sinus arrhythmia; abnormal R wave progression-cannot exclude prior anterior MI, but lead placement error also a consideration; otherwise normal. Stress EKG:  Slowly upsloping and borderline flat ST segment depression of 1-1.85 mm noted in leads 2, 3, F, V4, V5 and V6 near peak exercise and early in recovery; ST segments normalized within the first minute of recovery. Impression:  Borderline positive graded exercise test with marginally significant ST segment abnormalities in the absence of chest pain or other apparent symptoms of ischemia; somewhat impaired exercise capacity, but dyspnea was not prominent at low level exercise.  Oxygen saturation remained normal throughout  Other findings as noted.  [name], M.D.

## 2012-07-19 ENCOUNTER — Encounter: Payer: Self-pay | Admitting: Cardiovascular Disease

## 2012-08-02 ENCOUNTER — Ambulatory Visit (INDEPENDENT_AMBULATORY_CARE_PROVIDER_SITE_OTHER): Payer: Medicare Other | Admitting: Cardiovascular Disease

## 2012-08-02 ENCOUNTER — Encounter: Payer: Self-pay | Admitting: Cardiovascular Disease

## 2012-08-02 VITALS — BP 118/66 | HR 76 | Ht 68.0 in | Wt 176.0 lb

## 2012-08-02 DIAGNOSIS — I10 Essential (primary) hypertension: Secondary | ICD-10-CM | POA: Insufficient documentation

## 2012-08-02 DIAGNOSIS — R079 Chest pain, unspecified: Secondary | ICD-10-CM | POA: Diagnosis not present

## 2012-08-02 MED ORDER — NITROGLYCERIN 0.4 MG SL SUBL: 0.4000 mg | SUBLINGUAL_TABLET | SUBLINGUAL | Status: DC | PRN

## 2012-08-02 NOTE — Assessment & Plan Note (Signed)
Low risk but borderline ETT  Will call in nitro and f/u in 3 months.  Monitor during cardiac rehab Primary symptom is dyspnea not chest pain

## 2012-08-02 NOTE — Progress Notes (Signed)
Patient ID: Brandon [name], male   DOB: 19-Oct-1945, 67 y.o.   MRN: [id number] 67 yo referred for dyspnea, fatigue and atypical chest pain. Dyspnea and fatigue have been coming on for years. Atypical sharp stabbing left sided pain last month or so is new. No positional component. 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 Spirometry normal and no chronic lung disease. No history of CAD. Reviewed his echo from today and normal with no valve disease EF 55-60% and estimated PA pressure 28 No pericardial effusion  ETT done by Dr Dietrich Pates 07/06/12 read as borderline  Impression: Borderline positive graded exercise test with marginally significant ST segment abnormalities in the absence of chest pain or other apparent symptoms of ischemia; somewhat impaired exercise capacity, but dyspnea was not prominent at low level exercise. Oxygen saturation remained normal throughout Other findings as noted.  He continues to have mainly dyspnea with heavy activity like climbing hills in the woods or using a chain saw real hard.    Discussed options and I dont think cath inidcated at this time BP well controlled. Will call in nitro and refer to cardiac rehab for monitored exercise program  F/U 3 months    ROS: Denies fever, malais, weight loss, blurry vision, decreased visual acuity, cough, sputum, SOB, hemoptysis, pleuritic pain, palpitaitons, heartburn, abdominal pain, melena, lower extremity edema, claudication, or rash.  All other systems reviewed and negative  General: Affect appropriate Healthy:  appears stated age HEENT: normal Neck supple with no adenopathy JVP normal no bruits no thyromegaly Lungs clear with no wheezing and good diaphragmatic motion Heart:  S1/S2 no murmur, no rub, gallop or click PMI normal Abdomen: benighn, BS positve, no tenderness, no AAA no bruit.  No HSM or HJR Distal pulses intact with no bruits No edema Neuro non-focal Skin warm and dry No muscular  weakness   Current Outpatient Prescriptions  Medication Sig Dispense Refill  . HYDROcodone-acetaminophen (NORCO) 5-325 MG per tablet Take 1 tablet by mouth every 4 (four) hours as needed for pain. For pain  40 tablet  0  . ibuprofen (ADVIL,MOTRIN) 200 MG tablet Take 400 mg by mouth every 6 (six) hours as needed. For pain       . losartan-hydrochlorothiazide (HYZAAR) 100-12.5 MG per tablet Take 0.5 tablets by mouth as needed.      Marland Kitchen omeprazole (PRILOSEC) 20 MG capsule Take 20 mg by mouth daily.       . traMADol (ULTRAM) 50 MG tablet Take 50 mg by mouth every 6 (six) hours as needed.      . zolpidem (AMBIEN) 5 MG tablet Take 5 mg by mouth at bedtime.       No current facility-administered medications for this visit.    Allergies  Ciprofloxacin  Electrocardiogram:  06/21/12 SR rate 68 normal  Assessment and Plan

## 2012-08-02 NOTE — Assessment & Plan Note (Signed)
BP well controlled EF normal by echo and normal lung exam  Refer to cardiac rehab for exercise program

## 2012-08-02 NOTE — Patient Instructions (Addendum)
Your physician recommends that you schedule a follow-up appointment in: 3 MONTHS  YOUR PHYSICIAN RECOMMENDS THAT YOU HAVE CARDIAC REHAB, SOMEONE WILL CALL YOU TO SET UP THE APPOINTMENT DATE AND TIME  The proper use and anticipated side effects of nitroglycerine has been carefully explained.  If a single episode of chest pain is not relieved by one tablet, the patient will try another within 5 minutes; and if this doesn't relieve the pain, the patient is instructed to call 911 for transportation to an emergency department.

## 2012-08-02 NOTE — Assessment & Plan Note (Signed)
Well controlled.  Continue current medications and low sodium Dash type diet.    

## 2012-11-14 ENCOUNTER — Ambulatory Visit (INDEPENDENT_AMBULATORY_CARE_PROVIDER_SITE_OTHER): Payer: Medicare Other | Admitting: Cardiovascular Disease

## 2012-11-14 ENCOUNTER — Encounter: Payer: Self-pay | Admitting: Cardiovascular Disease

## 2012-11-14 VITALS — BP 120/72 | HR 76 | Ht 68.0 in | Wt 172.0 lb

## 2012-11-14 DIAGNOSIS — I10 Essential (primary) hypertension: Secondary | ICD-10-CM | POA: Diagnosis not present

## 2012-11-14 DIAGNOSIS — R079 Chest pain, unspecified: Secondary | ICD-10-CM

## 2012-11-14 DIAGNOSIS — R0989 Other specified symptoms and signs involving the circulatory and respiratory systems: Secondary | ICD-10-CM | POA: Diagnosis not present

## 2012-11-14 DIAGNOSIS — R0600 Dyspnea, unspecified: Secondary | ICD-10-CM

## 2012-11-14 DIAGNOSIS — R0609 Other forms of dyspnea: Secondary | ICD-10-CM | POA: Diagnosis not present

## 2012-11-14 NOTE — Patient Instructions (Addendum)
Your physician recommends that you schedule a follow-up appointment in: 6 MONTHS  Your physician has requested that you have a carotid duplex. This test is an ultrasound of the carotid arteries in your neck. It looks at blood flow through these arteries that supply the brain with blood. Allow one hour for this exam. There are no restrictions or special instructions.  PLEASE KEEP FOLLOW UP APT WITH DR Juanetta Gosling FOR YOUR PHYSICAL

## 2012-11-14 NOTE — Progress Notes (Signed)
Patient ID: Brandon [name], male   DOB: August 10, 1945, 67 y.o.   MRN: [id number] 67 yo referred for dyspnea, fatigue and atypical chest pain. Dyspnea and fatigue have been coming on for years. Atypical sharp stabbing left sided pain last month or so is new. No positional component. 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 Spirometry normal and no chronic lung disease. No history of CAD. Reviewed his echo from today and normal with no valve disease EF 55-60% and estimated PA pressure 28 No pericardial effusion  ETT done by Dr Dietrich Pates 07/06/12 read as borderline  Impression: Borderline positive graded exercise test with marginally significant ST segment abnormalities in the absence of chest pain or other apparent symptoms of ischemia; somewhat impaired exercise capacity, but dyspnea was not prominent at low level exercise. Oxygen saturation remained normal throughout Other findings as noted.  He continues to have mainly dyspnea with heavy activity like climbing hills in the woods or using a chain saw real hard.  Discussed options and I dont think cath inidcated at this time BP well controlled. Has not had recurrent pain Not needed to use nitro.  Dyspnea stable and likely related to lungs    Echo 06/21/12  Normal EF 55-60%   ROS: Denies fever, malais, weight loss, blurry vision, decreased visual acuity, cough, sputum, SOB, hemoptysis, pleuritic pain, palpitaitons, heartburn, abdominal pain, melena, lower extremity edema, claudication, or rash.  All other systems reviewed and negative  General: Affect appropriate Desheveled white male HEENT: normal Neck supple with no adenopathy JVP normal right bruits no thyromegaly Lungsright sided rhonchi with no wheezing and good diaphragmatic motion Heart:  S1/S2 no murmur, no rub, gallop or click PMI normal Abdomen: benighn, BS positve, no tenderness, no AAA no bruit.  No HSM or HJR Distal pulses intact with no bruits No  edema Neuro non-focal Skin warm and dry No muscular weakness   Current Outpatient Prescriptions  Medication Sig Dispense Refill  . esomeprazole (NEXIUM) 20 MG capsule Take 20 mg by mouth daily before breakfast.      . losartan-hydrochlorothiazide (HYZAAR) 100-12.5 MG per tablet Take 0.5 tablets by mouth as needed.      . nitroGLYCERIN (NITROSTAT) 0.4 MG SL tablet Place 1 tablet (0.4 mg total) under the tongue every 5 (five) minutes as needed for chest pain.  25 tablet  3  . zolpidem (AMBIEN) 5 MG tablet Take 5 mg by mouth at bedtime as needed.       Marland Kitchen HYDROcodone-acetaminophen (NORCO) 5-325 MG per tablet Take 1 tablet by mouth every 4 (four) hours as needed for pain. For pain  40 tablet  0  . ibuprofen (ADVIL,MOTRIN) 200 MG tablet Take 400 mg by mouth every 6 (six) hours as needed. For pain       . traMADol (ULTRAM) 50 MG tablet Take 50 mg by mouth every 6 (six) hours as needed.       No current facility-administered medications for this visit.    Allergies  Ciprofloxacin  Electrocardiogram:  Assessment and Plan

## 2012-11-14 NOTE — Assessment & Plan Note (Signed)
New on exam F/U carotid duplex.  If he has significant disease may decide to pursue CAD further with myovue

## 2012-11-14 NOTE — Assessment & Plan Note (Signed)
Not likely cardiac related with good EF by echo F/U with Juanetta Gosling  Office called

## 2012-11-14 NOTE — Assessment & Plan Note (Signed)
Resolved Dont think dyspnea is anginal equivalent Borderline ETT No indication for cath at this time ASA and has nitro if needed

## 2012-11-14 NOTE — Assessment & Plan Note (Signed)
Well controlled.  Continue current medications and low sodium Dash type diet.    

## 2012-11-20 ENCOUNTER — Other Ambulatory Visit: Payer: Self-pay | Admitting: *Deleted

## 2012-11-20 ENCOUNTER — Ambulatory Visit (HOSPITAL_COMMUNITY)
Admission: RE | Admit: 2012-11-20 | Discharge: 2012-11-20 | Disposition: A | Discharge status: Home / Self Care | Payer: Medicare Other | Source: Ambulatory Visit | Attending: Cardiovascular Disease | Admitting: Cardiovascular Disease

## 2012-11-20 DIAGNOSIS — I10 Essential (primary) hypertension: Secondary | ICD-10-CM | POA: Insufficient documentation

## 2012-11-20 DIAGNOSIS — I6529 Occlusion and stenosis of unspecified carotid artery: Secondary | ICD-10-CM | POA: Insufficient documentation

## 2012-11-20 DIAGNOSIS — R0989 Other specified symptoms and signs involving the circulatory and respiratory systems: Secondary | ICD-10-CM | POA: Insufficient documentation

## 2012-11-20 DIAGNOSIS — I658 Occlusion and stenosis of other precerebral arteries: Secondary | ICD-10-CM | POA: Diagnosis not present

## 2012-11-29 DIAGNOSIS — Z0000 Encounter for general adult medical examination without abnormal findings: Secondary | ICD-10-CM | POA: Diagnosis not present

## 2012-12-01 DIAGNOSIS — E785 Hyperlipidemia, unspecified: Secondary | ICD-10-CM | POA: Diagnosis not present

## 2012-12-01 DIAGNOSIS — Z125 Encounter for screening for malignant neoplasm of prostate: Secondary | ICD-10-CM | POA: Diagnosis not present

## 2012-12-01 DIAGNOSIS — K2100 Gastro-esophageal reflux disease with esophagitis, without bleeding: Secondary | ICD-10-CM | POA: Diagnosis not present

## 2012-12-01 DIAGNOSIS — Z0000 Encounter for general adult medical examination without abnormal findings: Secondary | ICD-10-CM | POA: Diagnosis not present

## 2012-12-12 ENCOUNTER — Encounter (INDEPENDENT_AMBULATORY_CARE_PROVIDER_SITE_OTHER): Payer: Self-pay | Admitting: *Deleted

## 2012-12-19 DIAGNOSIS — R0602 Shortness of breath: Secondary | ICD-10-CM | POA: Diagnosis not present

## 2012-12-19 DIAGNOSIS — N419 Inflammatory disease of prostate, unspecified: Secondary | ICD-10-CM | POA: Diagnosis not present

## 2012-12-19 DIAGNOSIS — I10 Essential (primary) hypertension: Secondary | ICD-10-CM | POA: Diagnosis not present

## 2012-12-20 ENCOUNTER — Other Ambulatory Visit (INDEPENDENT_AMBULATORY_CARE_PROVIDER_SITE_OTHER): Payer: Self-pay | Admitting: *Deleted

## 2012-12-20 ENCOUNTER — Telehealth (INDEPENDENT_AMBULATORY_CARE_PROVIDER_SITE_OTHER): Payer: Self-pay | Admitting: *Deleted

## 2012-12-20 DIAGNOSIS — Z1211 Encounter for screening for malignant neoplasm of colon: Secondary | ICD-10-CM

## 2012-12-20 MED ORDER — PEG-KCL-NACL-NASULF-NA ASC-C 100 G PO SOLR: 1.0000 | Freq: Once | ORAL | Status: DC

## 2012-12-20 NOTE — Telephone Encounter (Signed)
Patient needs movi prep 

## 2013-01-10 ENCOUNTER — Telehealth (INDEPENDENT_AMBULATORY_CARE_PROVIDER_SITE_OTHER): Payer: Self-pay | Admitting: *Deleted

## 2013-01-10 NOTE — Telephone Encounter (Signed)
agree

## 2013-01-10 NOTE — Telephone Encounter (Signed)
  Procedure: tcs  Reason/Indication:  screening  Has patient had this procedure before?  no  If so, when, by whom and where?    Is there a family history of colon cancer?  no  Who?  What age when diagnosed?    Is patient diabetic?   no      Does patient have prosthetic heart valve?  no  Do you have a pacemaker?  no  Has patient ever had endocarditis? no  Has patient had joint replacement within last 12 months?  no  Is patient on Coumadin, Plavix and/or Aspirin? no  Medications: hctz/losartan 12.5/100 mg daily, ambien 10 mg nightly, omeprazole 20 mg daily, advil bid  Allergies: cipro  Medication Adjustment:   Procedure date & time: 02/07/13 at 930

## 2013-01-21 DIAGNOSIS — R51 Headache: Secondary | ICD-10-CM | POA: Diagnosis not present

## 2013-01-21 DIAGNOSIS — N419 Inflammatory disease of prostate, unspecified: Secondary | ICD-10-CM | POA: Diagnosis not present

## 2013-01-21 DIAGNOSIS — I10 Essential (primary) hypertension: Secondary | ICD-10-CM | POA: Diagnosis not present

## 2013-01-25 ENCOUNTER — Encounter (HOSPITAL_COMMUNITY): Payer: Self-pay | Admitting: Pharmacy Technician

## 2013-02-04 MED ORDER — EPINEPHRINE HCL 1 MG/ML IJ SOLN
INTRAMUSCULAR | Status: AC
  Filled 2013-02-04: qty 1

## 2013-02-07 ENCOUNTER — Ambulatory Visit (HOSPITAL_COMMUNITY): Admission: RE | Admit: 2013-02-07 | Payer: Medicare Other | Source: Ambulatory Visit | Admitting: Internal Medicine

## 2013-02-07 ENCOUNTER — Encounter (HOSPITAL_COMMUNITY): Admission: RE | Payer: Self-pay | Source: Ambulatory Visit

## 2013-02-07 SURGERY — COLONOSCOPY
Anesthesia: Moderate Sedation

## 2013-04-04 DIAGNOSIS — R51 Headache: Secondary | ICD-10-CM | POA: Diagnosis not present

## 2013-04-04 DIAGNOSIS — I10 Essential (primary) hypertension: Secondary | ICD-10-CM | POA: Diagnosis not present

## 2013-04-04 DIAGNOSIS — I951 Orthostatic hypotension: Secondary | ICD-10-CM | POA: Diagnosis not present

## 2013-07-01 DIAGNOSIS — D235 Other benign neoplasm of skin of trunk: Secondary | ICD-10-CM | POA: Diagnosis not present

## 2013-07-01 DIAGNOSIS — L738 Other specified follicular disorders: Secondary | ICD-10-CM | POA: Diagnosis not present

## 2013-07-01 DIAGNOSIS — L570 Actinic keratosis: Secondary | ICD-10-CM | POA: Diagnosis not present

## 2013-07-12 ENCOUNTER — Encounter: Payer: Self-pay | Admitting: Cardiovascular Disease

## 2013-07-12 ENCOUNTER — Ambulatory Visit: Payer: Medicare Other | Admitting: Cardiovascular Disease

## 2013-08-01 ENCOUNTER — Ambulatory Visit (INDEPENDENT_AMBULATORY_CARE_PROVIDER_SITE_OTHER): Payer: Medicare Other | Admitting: Cardiovascular Disease

## 2013-08-01 ENCOUNTER — Encounter: Payer: Self-pay | Admitting: Cardiovascular Disease

## 2013-08-01 VITALS — BP 141/87 | HR 65 | Ht 68.0 in | Wt 174.0 lb

## 2013-08-01 DIAGNOSIS — R0989 Other specified symptoms and signs involving the circulatory and respiratory systems: Secondary | ICD-10-CM

## 2013-08-01 DIAGNOSIS — I10 Essential (primary) hypertension: Secondary | ICD-10-CM

## 2013-08-01 DIAGNOSIS — R0609 Other forms of dyspnea: Secondary | ICD-10-CM

## 2013-08-01 DIAGNOSIS — R079 Chest pain, unspecified: Secondary | ICD-10-CM | POA: Diagnosis not present

## 2013-08-01 DIAGNOSIS — R0600 Dyspnea, unspecified: Secondary | ICD-10-CM

## 2013-08-01 NOTE — Progress Notes (Signed)
Patient ID: Brandon [name], male   DOB: 06-Sep-1945, 68 y.o.   MRN: [id number] 68 yo referred for dyspnea, fatigue and atypical chest pain. Dyspnea and fatigue have been coming on for years. Atypical sharp stabbing left sided pain last month or so is new. No positional component. 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 Spirometry normal and no chronic lung disease. No history of CAD. Reviewed his echo from today and normal with no valve disease EF 55-60% and estimated PA pressure 28 No pericardial effusion  ETT done by Dr Lattie Haw 07/06/12 read as borderline  Impression: Borderline positive graded exercise test with marginally significant ST segment abnormalities in the absence of chest pain or other apparent symptoms of ischemia; somewhat impaired exercise capacity, but dyspnea was not prominent at low level exercise. Oxygen saturation remained normal throughout Other findings as noted.  He continues to have mainly dyspnea with heavy activity like climbing hills in the woods or using a chain saw real hard.  Discussed options and I dont think cath inidcated at this time BP well controlled. Has not had recurrent pain Not needed to use nitro. Dyspnea stable and likely related to lungs  Echo 06/21/12 Normal EF 55-60%   Did not go to [facility].  Still with exertional dyspnea Has not taken nitro     ROS: Denies fever, malais, weight loss, blurry vision, decreased visual acuity, cough, sputum, SOB, hemoptysis, pleuritic pain, palpitaitons, heartburn, abdominal pain, melena, lower extremity edema, claudication, or rash.  All other systems reviewed and negative  General: Affect appropriate Healthy:  appears stated age 68: normal Neck supple with no adenopathy JVP normal right  bruits no thyromegaly Lungs clear with no wheezing and good diaphragmatic motion Heart:  S1/S2 no murmur, no rub, gallop or click PMI normal Abdomen: benighn, BS positve, no tenderness, no AAA no  bruit.  No HSM or HJR Distal pulses intact with no bruits No edema Neuro non-focal Skin warm and dry No muscular weakness   Current Outpatient Prescriptions  Medication Sig Dispense Refill  . esomeprazole (NEXIUM) 20 MG capsule Take 20 mg by mouth daily before breakfast.      . losartan-hydrochlorothiazide (HYZAAR) 100-12.5 MG per tablet Take 0.5 tablets by mouth as needed.      . nitroGLYCERIN (NITROSTAT) 0.4 MG SL tablet Place 0.4 mg under the tongue every 5 (five) minutes as needed for chest pain.      Marland Kitchen zolpidem (AMBIEN) 5 MG tablet Take 5 mg by mouth at bedtime as needed.        No current facility-administered medications for this visit.    Allergies  Ciprofloxacin  Electrocardiogram:  NSR rate 65 normal   Assessment and Plan

## 2013-08-01 NOTE — Assessment & Plan Note (Signed)
Well controlled.  Continue current medications and low sodium Dash type diet.    

## 2013-08-01 NOTE — Patient Instructions (Signed)
Your physician recommends that you continue on your current medications as directed. Please refer to the Current Medication list given to you today.  We are going to send a referral for you to do Cardiac Rehab in [place]. They will be calling you to get you scheduled  Your physician recommends that you schedule a follow-up appointment in: 3 months with Dr Johnsie Cancel

## 2013-08-01 NOTE — Assessment & Plan Note (Signed)
0/34 74-25% LICA stenosis  F/u duplex 6/15

## 2013-08-01 NOTE — Assessment & Plan Note (Signed)
Not clear etiology EF normal by echo and ETT 2014 normal as was PFT;s  Discussed cath with him but he does not want to proceed at this time Prefers to go to cardiac rehab and see how he does.  Encouraged him to take nitro to see if dyspnea is anginal equivalent.  F/U with me 3 months

## 2013-08-12 ENCOUNTER — Telehealth: Payer: Self-pay | Admitting: Cardiovascular Disease

## 2013-08-12 DIAGNOSIS — R0600 Dyspnea, unspecified: Secondary | ICD-10-CM

## 2013-08-12 NOTE — Telephone Encounter (Signed)
Follow Up    Pt following up on cardiac rehab follow up and information. Please call.

## 2013-08-13 NOTE — Telephone Encounter (Signed)
CARDIAC  REHAB FORM  FAXED  TO  [place]./CY

## 2013-09-03 DIAGNOSIS — H11049 Peripheral pterygium, stationary, unspecified eye: Secondary | ICD-10-CM | POA: Diagnosis not present

## 2013-09-03 DIAGNOSIS — H2510 Age-related nuclear cataract, unspecified eye: Secondary | ICD-10-CM | POA: Diagnosis not present

## 2013-09-03 DIAGNOSIS — H4920 Sixth [abducent] nerve palsy, unspecified eye: Secondary | ICD-10-CM | POA: Diagnosis not present

## 2013-09-13 ENCOUNTER — Telehealth: Payer: Self-pay | Admitting: Cardiovascular Disease

## 2013-09-13 NOTE — Telephone Encounter (Signed)
New message     Need diagnosis for cardiac rehab program.  Is patient's angina stable?

## 2013-09-13 NOTE — Telephone Encounter (Signed)
Called to advise [place] that Medicare will not pay for dx of Dyspnea for his cardiac rehab.  They have spoken with pt and he can pay out of pocket for the maintenance  program. She just wanted the office to be aware.

## 2013-10-08 ENCOUNTER — Encounter (HOSPITAL_COMMUNITY)
Admission: RE | Admit: 2013-10-08 | Discharge: 2013-10-08 | Disposition: A | Discharge status: Home / Self Care | Payer: Self-pay | Source: Ambulatory Visit | Attending: Cardiovascular Disease | Admitting: Cardiovascular Disease

## 2013-10-08 DIAGNOSIS — R0989 Other specified symptoms and signs involving the circulatory and respiratory systems: Secondary | ICD-10-CM | POA: Insufficient documentation

## 2013-10-08 DIAGNOSIS — R079 Chest pain, unspecified: Secondary | ICD-10-CM | POA: Insufficient documentation

## 2013-10-08 DIAGNOSIS — Z5189 Encounter for other specified aftercare: Secondary | ICD-10-CM | POA: Insufficient documentation

## 2013-10-08 DIAGNOSIS — R0609 Other forms of dyspnea: Secondary | ICD-10-CM | POA: Insufficient documentation

## 2013-10-14 ENCOUNTER — Encounter (HOSPITAL_COMMUNITY)
Admission: RE | Admit: 2013-10-14 | Discharge: 2013-10-14 | Disposition: A | Discharge status: Home / Self Care | Payer: Self-pay | Source: Ambulatory Visit | Attending: Cardiovascular Disease | Admitting: Cardiovascular Disease

## 2013-10-16 ENCOUNTER — Encounter (HOSPITAL_COMMUNITY)
Admission: RE | Admit: 2013-10-16 | Discharge: 2013-10-16 | Disposition: A | Discharge status: Home / Self Care | Payer: Self-pay | Source: Ambulatory Visit | Attending: Cardiovascular Disease | Admitting: Cardiovascular Disease

## 2013-10-18 ENCOUNTER — Telehealth: Payer: Self-pay | Admitting: Cardiovascular Disease

## 2013-10-18 ENCOUNTER — Encounter (HOSPITAL_COMMUNITY)
Admission: RE | Admit: 2013-10-18 | Discharge: 2013-10-18 | Disposition: A | Discharge status: Home / Self Care | Payer: Self-pay | Source: Ambulatory Visit | Attending: Cardiovascular Disease | Admitting: Cardiovascular Disease

## 2013-10-18 NOTE — Telephone Encounter (Signed)
LEFT PT  A MESSAGE TO CALL BACK  RE   MESSAGE./CY

## 2013-10-18 NOTE — Telephone Encounter (Signed)
New message    Patient at rehab now not having any chest pain.    Blood pressure high, still complain of chest pain when working .   Today 157/86 sitting reading  from patient blood pressure  cuff.   Started cardiac rehab on 5/20 . 138/70, 130/82, 122/72.

## 2013-10-21 ENCOUNTER — Encounter (HOSPITAL_COMMUNITY): Payer: Self-pay

## 2013-10-23 ENCOUNTER — Encounter (HOSPITAL_COMMUNITY)
Admission: RE | Admit: 2013-10-23 | Discharge: 2013-10-23 | Disposition: A | Discharge status: Home / Self Care | Payer: Self-pay | Source: Ambulatory Visit | Attending: Cardiovascular Disease | Admitting: Cardiovascular Disease

## 2013-10-25 ENCOUNTER — Encounter (HOSPITAL_COMMUNITY)
Admission: RE | Admit: 2013-10-25 | Discharge: 2013-10-25 | Disposition: A | Discharge status: Home / Self Care | Payer: Self-pay | Source: Ambulatory Visit | Attending: Cardiovascular Disease | Admitting: Cardiovascular Disease

## 2013-10-28 ENCOUNTER — Encounter (HOSPITAL_COMMUNITY)
Admission: RE | Admit: 2013-10-28 | Discharge: 2013-10-28 | Disposition: A | Discharge status: Home / Self Care | Payer: Self-pay | Source: Ambulatory Visit | Attending: Cardiovascular Disease | Admitting: Cardiovascular Disease

## 2013-10-28 DIAGNOSIS — R079 Chest pain, unspecified: Secondary | ICD-10-CM | POA: Insufficient documentation

## 2013-10-28 DIAGNOSIS — R0989 Other specified symptoms and signs involving the circulatory and respiratory systems: Secondary | ICD-10-CM | POA: Insufficient documentation

## 2013-10-28 DIAGNOSIS — Z5189 Encounter for other specified aftercare: Secondary | ICD-10-CM | POA: Insufficient documentation

## 2013-10-28 DIAGNOSIS — R0609 Other forms of dyspnea: Secondary | ICD-10-CM | POA: Insufficient documentation

## 2013-10-30 ENCOUNTER — Encounter (HOSPITAL_COMMUNITY)
Admission: RE | Admit: 2013-10-30 | Discharge: 2013-10-30 | Disposition: A | Discharge status: Home / Self Care | Payer: Self-pay | Source: Ambulatory Visit | Attending: Cardiovascular Disease | Admitting: Cardiovascular Disease

## 2013-11-01 ENCOUNTER — Ambulatory Visit (INDEPENDENT_AMBULATORY_CARE_PROVIDER_SITE_OTHER): Payer: Medicare Other | Admitting: Cardiovascular Disease

## 2013-11-01 ENCOUNTER — Encounter: Payer: Self-pay | Admitting: Cardiovascular Disease

## 2013-11-01 ENCOUNTER — Encounter (HOSPITAL_COMMUNITY)
Admission: RE | Admit: 2013-11-01 | Discharge: 2013-11-01 | Disposition: A | Discharge status: Home / Self Care | Payer: Self-pay | Source: Ambulatory Visit | Attending: Cardiovascular Disease | Admitting: Cardiovascular Disease

## 2013-11-01 VITALS — BP 138/78 | HR 68 | Ht 68.0 in | Wt 175.8 lb

## 2013-11-01 DIAGNOSIS — I10 Essential (primary) hypertension: Secondary | ICD-10-CM | POA: Diagnosis not present

## 2013-11-01 DIAGNOSIS — R0989 Other specified symptoms and signs involving the circulatory and respiratory systems: Secondary | ICD-10-CM

## 2013-11-01 MED ORDER — LOSARTAN POTASSIUM-HCTZ 50-12.5 MG PO TABS: 1.0000 | ORAL_TABLET | Freq: Every day | ORAL | Status: DC

## 2013-11-01 NOTE — Progress Notes (Signed)
Patient ID: Brandon [name], male   DOB: 1946-02-16, 68 y.o.   MRN: [id number] 68 yo referred for dyspnea, fatigue and atypical chest pain. Dyspnea and fatigue have been coming on for years. Atypical sharp stabbing left sided pain last month or so is new. No positional component. 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 Spirometry normal and no chronic lung disease. No history of CAD. Reviewed his echo from today and normal with no valve disease EF 55-60% and estimated PA pressure 28 No pericardial effusion  ETT done by Dr Lattie Haw 07/06/12 read as borderline  Impression: Borderline positive graded exercise test with marginally significant ST segment abnormalities in the absence of chest pain or other apparent symptoms of ischemia; somewhat impaired exercise capacity, but dyspnea was not prominent at low level exercise. Oxygen saturation remained normal throughout Other findings as noted.  He continues to have mainly dyspnea with heavy activity like climbing hills in the woods or using a chain saw real hard.  Discussed options and I dont think cath inidcated at this time BP well controlled. Has not had recurrent pain Not needed to use nitro. 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  Feels that current hyzaar dose too strong     ROS: Denies fever, malais, weight loss, blurry vision, decreased visual acuity, cough, sputum, SOB, hemoptysis, pleuritic pain, palpitaitons, heartburn, abdominal pain, melena, lower extremity edema, claudication, or rash.  All other systems reviewed and negative  General: Affect appropriate Healthy:  appears stated age 10: normal Neck supple with no adenopathy JVP normal right bruits no thyromegaly Lungs clear with no wheezing and good diaphragmatic motion Heart:  S1/S2 no murmur, no rub, gallop or click PMI normal Abdomen: benighn, BS positve, no tenderness, no AAA no  bruit.  No HSM or HJR Distal pulses intact with no bruits No edema Neuro non-focal Skin warm and dry No muscular weakness   Current Outpatient Prescriptions  Medication Sig Dispense Refill  . esomeprazole (NEXIUM) 20 MG capsule Take 20 mg by mouth daily before breakfast.      . losartan-hydrochlorothiazide (HYZAAR) 100-12.5 MG per tablet Take 0.5 tablets by mouth as needed.      . nitroGLYCERIN (NITROSTAT) 0.4 MG SL tablet Place 0.4 mg under the tongue every 5 (five) minutes as needed for chest pain.      Marland Kitchen zolpidem (AMBIEN) 5 MG tablet Take 5 mg by mouth at bedtime as needed.        No current facility-administered medications for this visit.    Allergies  Ciprofloxacin  Electrocardiogram:  SR rate 65 normal   Assessment and Plan

## 2013-11-01 NOTE — Assessment & Plan Note (Signed)
Reviewed readings from [facility] and they are fine Decrease to 50/12.5 Hyzaar  Patient monitors BP at home well

## 2013-11-01 NOTE — Assessment & Plan Note (Signed)
Resolved normal stress test  And normal ECG

## 2013-11-01 NOTE — Patient Instructions (Addendum)
Your physician wants you to follow-up in:   [place] will receive a reminder letter in the mail two months in advance. If you don't receive a letter, please call our office to schedule the follow-up appointment. Your physician has recommended you make the following change in your medication:  DECREASE  LOSARTAN /HCTZ  TO  50 /12.5 MG   EVERY DAY  Your physician has requested that you have a carotid duplex. This test is an ultrasound of the carotid arteries in your neck. It looks at blood flow through these arteries that supply the brain with blood. Allow one hour for this exam. There are no restrictions or special instructions. DUE IN  6 MONTHS

## 2013-11-01 NOTE — Assessment & Plan Note (Signed)
50-60%  RICA stenosis.  F/U carotid duplex in a year

## 2013-11-04 ENCOUNTER — Encounter (HOSPITAL_COMMUNITY)
Admission: RE | Admit: 2013-11-04 | Discharge: 2013-11-04 | Disposition: A | Discharge status: Home / Self Care | Payer: Self-pay | Source: Ambulatory Visit | Attending: Cardiovascular Disease | Admitting: Cardiovascular Disease

## 2013-11-04 NOTE — Telephone Encounter (Signed)
PER PT  FEELS FINE    WAS  SEEN IN  CLINIC  ON  11-01-13 .[name]

## 2013-11-06 ENCOUNTER — Encounter (HOSPITAL_COMMUNITY)
Admission: RE | Admit: 2013-11-06 | Discharge: 2013-11-06 | Disposition: A | Discharge status: Home / Self Care | Payer: Self-pay | Source: Ambulatory Visit | Attending: Cardiovascular Disease | Admitting: Cardiovascular Disease

## 2013-11-07 ENCOUNTER — Encounter: Payer: Self-pay | Admitting: Cardiovascular Disease

## 2013-11-08 ENCOUNTER — Encounter (HOSPITAL_COMMUNITY)
Admission: RE | Admit: 2013-11-08 | Discharge: 2013-11-08 | Disposition: A | Discharge status: Home / Self Care | Payer: Self-pay | Source: Ambulatory Visit | Attending: Cardiovascular Disease | Admitting: Cardiovascular Disease

## 2013-11-11 ENCOUNTER — Encounter (HOSPITAL_COMMUNITY)
Admission: RE | Admit: 2013-11-11 | Discharge: 2013-11-11 | Disposition: A | Discharge status: Home / Self Care | Payer: Self-pay | Source: Ambulatory Visit | Attending: Cardiovascular Disease | Admitting: Cardiovascular Disease

## 2013-11-13 ENCOUNTER — Encounter (HOSPITAL_COMMUNITY)
Admission: RE | Admit: 2013-11-13 | Discharge: 2013-11-13 | Disposition: A | Discharge status: Home / Self Care | Payer: Self-pay | Source: Ambulatory Visit | Attending: Cardiovascular Disease | Admitting: Cardiovascular Disease

## 2013-11-15 ENCOUNTER — Encounter (HOSPITAL_COMMUNITY)
Admission: RE | Admit: 2013-11-15 | Discharge: 2013-11-15 | Disposition: A | Discharge status: Home / Self Care | Payer: Self-pay | Source: Ambulatory Visit | Attending: Cardiovascular Disease | Admitting: Cardiovascular Disease

## 2013-11-18 ENCOUNTER — Encounter (HOSPITAL_COMMUNITY)
Admission: RE | Admit: 2013-11-18 | Discharge: 2013-11-18 | Disposition: A | Discharge status: Home / Self Care | Payer: Self-pay | Source: Ambulatory Visit | Attending: Cardiovascular Disease | Admitting: Cardiovascular Disease

## 2013-11-19 ENCOUNTER — Telehealth: Payer: Self-pay | Admitting: *Deleted

## 2013-11-19 MED ORDER — NITROGLYCERIN 0.4 MG SL SUBL: 0.4000 mg | SUBLINGUAL_TABLET | SUBLINGUAL | Status: DC | PRN

## 2013-11-19 NOTE — Telephone Encounter (Signed)
Refill nitro

## 2013-11-20 ENCOUNTER — Encounter (HOSPITAL_COMMUNITY)
Admission: RE | Admit: 2013-11-20 | Discharge: 2013-11-20 | Disposition: A | Discharge status: Home / Self Care | Payer: Self-pay | Source: Ambulatory Visit | Attending: Cardiovascular Disease | Admitting: Cardiovascular Disease

## 2013-11-22 ENCOUNTER — Encounter (HOSPITAL_COMMUNITY)
Admission: RE | Admit: 2013-11-22 | Discharge: 2013-11-22 | Disposition: A | Discharge status: Home / Self Care | Payer: Self-pay | Source: Ambulatory Visit | Attending: Cardiovascular Disease | Admitting: Cardiovascular Disease

## 2013-11-25 ENCOUNTER — Encounter (HOSPITAL_COMMUNITY)
Admission: RE | Admit: 2013-11-25 | Discharge: 2013-11-25 | Disposition: A | Discharge status: Home / Self Care | Payer: Self-pay | Source: Ambulatory Visit | Attending: Cardiovascular Disease | Admitting: Cardiovascular Disease

## 2013-11-27 ENCOUNTER — Encounter (HOSPITAL_COMMUNITY)
Admission: RE | Admit: 2013-11-27 | Discharge: 2013-11-27 | Disposition: A | Discharge status: Home / Self Care | Payer: Self-pay | Source: Ambulatory Visit | Attending: Cardiovascular Disease | Admitting: Cardiovascular Disease

## 2013-11-27 DIAGNOSIS — R0989 Other specified symptoms and signs involving the circulatory and respiratory systems: Secondary | ICD-10-CM | POA: Insufficient documentation

## 2013-11-27 DIAGNOSIS — R079 Chest pain, unspecified: Secondary | ICD-10-CM | POA: Insufficient documentation

## 2013-11-27 DIAGNOSIS — Z5189 Encounter for other specified aftercare: Secondary | ICD-10-CM | POA: Insufficient documentation

## 2013-11-27 DIAGNOSIS — R0609 Other forms of dyspnea: Secondary | ICD-10-CM | POA: Insufficient documentation

## 2013-11-29 ENCOUNTER — Encounter (HOSPITAL_COMMUNITY): Payer: Self-pay

## 2013-12-02 ENCOUNTER — Encounter (HOSPITAL_COMMUNITY)
Admission: RE | Admit: 2013-12-02 | Discharge: 2013-12-02 | Disposition: A | Discharge status: Home / Self Care | Payer: Self-pay | Source: Ambulatory Visit | Attending: Cardiovascular Disease | Admitting: Cardiovascular Disease

## 2013-12-04 ENCOUNTER — Encounter (HOSPITAL_COMMUNITY): Payer: Self-pay

## 2013-12-04 ENCOUNTER — Other Ambulatory Visit (HOSPITAL_COMMUNITY): Payer: Self-pay | Admitting: Pulmonary Disease

## 2013-12-04 DIAGNOSIS — R413 Other amnesia: Secondary | ICD-10-CM | POA: Diagnosis not present

## 2013-12-04 DIAGNOSIS — K2100 Gastro-esophageal reflux disease with esophagitis, without bleeding: Secondary | ICD-10-CM | POA: Diagnosis not present

## 2013-12-04 DIAGNOSIS — Z79899 Other long term (current) drug therapy: Secondary | ICD-10-CM | POA: Diagnosis not present

## 2013-12-04 DIAGNOSIS — I259 Chronic ischemic heart disease, unspecified: Secondary | ICD-10-CM | POA: Diagnosis not present

## 2013-12-04 DIAGNOSIS — R51 Headache: Secondary | ICD-10-CM | POA: Diagnosis not present

## 2013-12-04 LAB — TSH: TSH: 1.44 (ref <=5.90)

## 2013-12-05 ENCOUNTER — Ambulatory Visit (HOSPITAL_COMMUNITY)
Admission: RE | Admit: 2013-12-05 | Discharge: 2013-12-05 | Disposition: A | Discharge status: Home / Self Care | Payer: Medicare Other | Source: Ambulatory Visit | Attending: Pulmonary Disease | Admitting: Pulmonary Disease

## 2013-12-05 DIAGNOSIS — Z87828 Personal history of other (healed) physical injury and trauma: Secondary | ICD-10-CM | POA: Diagnosis not present

## 2013-12-05 DIAGNOSIS — G319 Degenerative disease of nervous system, unspecified: Secondary | ICD-10-CM | POA: Diagnosis not present

## 2013-12-05 DIAGNOSIS — I6789 Other cerebrovascular disease: Secondary | ICD-10-CM | POA: Insufficient documentation

## 2013-12-05 DIAGNOSIS — R413 Other amnesia: Secondary | ICD-10-CM | POA: Insufficient documentation

## 2013-12-06 ENCOUNTER — Encounter (HOSPITAL_COMMUNITY)
Admission: RE | Admit: 2013-12-06 | Discharge: 2013-12-06 | Disposition: A | Discharge status: Home / Self Care | Payer: Self-pay | Source: Ambulatory Visit | Attending: Cardiovascular Disease | Admitting: Cardiovascular Disease

## 2013-12-09 ENCOUNTER — Encounter (HOSPITAL_COMMUNITY)
Admission: RE | Admit: 2013-12-09 | Discharge: 2013-12-09 | Disposition: A | Discharge status: Home / Self Care | Payer: Self-pay | Source: Ambulatory Visit | Attending: Cardiovascular Disease | Admitting: Cardiovascular Disease

## 2013-12-11 ENCOUNTER — Encounter (HOSPITAL_COMMUNITY)
Admission: RE | Admit: 2013-12-11 | Discharge: 2013-12-11 | Disposition: A | Discharge status: Home / Self Care | Payer: Self-pay | Source: Ambulatory Visit | Attending: Cardiovascular Disease | Admitting: Cardiovascular Disease

## 2013-12-13 ENCOUNTER — Encounter (HOSPITAL_COMMUNITY)
Admission: RE | Admit: 2013-12-13 | Discharge: 2013-12-13 | Disposition: A | Discharge status: Home / Self Care | Payer: Self-pay | Source: Ambulatory Visit | Attending: Cardiovascular Disease | Admitting: Cardiovascular Disease

## 2013-12-16 ENCOUNTER — Encounter (HOSPITAL_COMMUNITY)
Admission: RE | Admit: 2013-12-16 | Discharge: 2013-12-16 | Disposition: A | Discharge status: Home / Self Care | Payer: Self-pay | Source: Ambulatory Visit | Attending: Cardiovascular Disease | Admitting: Cardiovascular Disease

## 2013-12-18 ENCOUNTER — Encounter (HOSPITAL_COMMUNITY)
Admission: RE | Admit: 2013-12-18 | Discharge: 2013-12-18 | Disposition: A | Discharge status: Home / Self Care | Payer: Self-pay | Source: Ambulatory Visit | Attending: Cardiovascular Disease | Admitting: Cardiovascular Disease

## 2013-12-20 ENCOUNTER — Encounter (HOSPITAL_COMMUNITY)
Admission: RE | Admit: 2013-12-20 | Discharge: 2013-12-20 | Disposition: A | Discharge status: Home / Self Care | Payer: Self-pay | Source: Ambulatory Visit | Attending: Cardiovascular Disease | Admitting: Cardiovascular Disease

## 2013-12-20 DIAGNOSIS — Z5189 Encounter for other specified aftercare: Secondary | ICD-10-CM | POA: Insufficient documentation

## 2013-12-20 DIAGNOSIS — R0989 Other specified symptoms and signs involving the circulatory and respiratory systems: Secondary | ICD-10-CM | POA: Insufficient documentation

## 2013-12-20 DIAGNOSIS — R079 Chest pain, unspecified: Secondary | ICD-10-CM | POA: Insufficient documentation

## 2013-12-20 DIAGNOSIS — R0609 Other forms of dyspnea: Secondary | ICD-10-CM | POA: Insufficient documentation

## 2013-12-23 ENCOUNTER — Encounter (HOSPITAL_COMMUNITY)
Admission: RE | Admit: 2013-12-23 | Discharge: 2013-12-23 | Disposition: A | Discharge status: Home / Self Care | Payer: Self-pay | Source: Ambulatory Visit | Attending: Cardiovascular Disease | Admitting: Cardiovascular Disease

## 2013-12-25 ENCOUNTER — Encounter (HOSPITAL_COMMUNITY)
Admission: RE | Admit: 2013-12-25 | Discharge: 2013-12-25 | Disposition: A | Discharge status: Home / Self Care | Payer: Self-pay | Source: Ambulatory Visit | Attending: Cardiovascular Disease | Admitting: Cardiovascular Disease

## 2013-12-27 ENCOUNTER — Encounter (HOSPITAL_COMMUNITY)
Admission: RE | Admit: 2013-12-27 | Discharge: 2013-12-27 | Disposition: A | Discharge status: Home / Self Care | Payer: Self-pay | Source: Ambulatory Visit | Attending: Cardiovascular Disease | Admitting: Cardiovascular Disease

## 2013-12-30 ENCOUNTER — Encounter (HOSPITAL_COMMUNITY)
Admission: RE | Admit: 2013-12-30 | Discharge: 2013-12-30 | Disposition: A | Discharge status: Home / Self Care | Payer: Self-pay | Source: Ambulatory Visit | Attending: Cardiovascular Disease | Admitting: Cardiovascular Disease

## 2013-12-30 DIAGNOSIS — Z5189 Encounter for other specified aftercare: Secondary | ICD-10-CM | POA: Insufficient documentation

## 2013-12-30 DIAGNOSIS — R079 Chest pain, unspecified: Secondary | ICD-10-CM | POA: Insufficient documentation

## 2013-12-30 DIAGNOSIS — R0609 Other forms of dyspnea: Secondary | ICD-10-CM | POA: Insufficient documentation

## 2013-12-30 DIAGNOSIS — R0989 Other specified symptoms and signs involving the circulatory and respiratory systems: Secondary | ICD-10-CM | POA: Insufficient documentation

## 2014-01-01 ENCOUNTER — Encounter (HOSPITAL_COMMUNITY)
Admission: RE | Admit: 2014-01-01 | Discharge: 2014-01-01 | Disposition: A | Discharge status: Home / Self Care | Payer: Self-pay | Source: Ambulatory Visit | Attending: Cardiovascular Disease | Admitting: Cardiovascular Disease

## 2014-01-03 ENCOUNTER — Encounter (HOSPITAL_COMMUNITY)
Admission: RE | Admit: 2014-01-03 | Discharge: 2014-01-03 | Disposition: A | Discharge status: Home / Self Care | Payer: Self-pay | Source: Ambulatory Visit | Attending: Cardiovascular Disease | Admitting: Cardiovascular Disease

## 2014-01-06 ENCOUNTER — Encounter (HOSPITAL_COMMUNITY)
Admission: RE | Admit: 2014-01-06 | Discharge: 2014-01-06 | Disposition: A | Discharge status: Home / Self Care | Payer: Self-pay | Source: Ambulatory Visit | Attending: Cardiovascular Disease | Admitting: Cardiovascular Disease

## 2014-01-08 ENCOUNTER — Encounter (HOSPITAL_COMMUNITY)
Admission: RE | Admit: 2014-01-08 | Discharge: 2014-01-08 | Disposition: A | Discharge status: Home / Self Care | Payer: Self-pay | Source: Ambulatory Visit | Attending: Cardiovascular Disease | Admitting: Cardiovascular Disease

## 2014-01-10 ENCOUNTER — Encounter (HOSPITAL_COMMUNITY)
Admission: RE | Admit: 2014-01-10 | Discharge: 2014-01-10 | Disposition: A | Discharge status: Home / Self Care | Payer: Self-pay | Source: Ambulatory Visit | Attending: Cardiovascular Disease | Admitting: Cardiovascular Disease

## 2014-01-13 ENCOUNTER — Encounter (HOSPITAL_COMMUNITY)
Admission: RE | Admit: 2014-01-13 | Discharge: 2014-01-13 | Disposition: A | Discharge status: Home / Self Care | Payer: Self-pay | Source: Ambulatory Visit | Attending: Cardiovascular Disease | Admitting: Cardiovascular Disease

## 2014-01-15 ENCOUNTER — Encounter (HOSPITAL_COMMUNITY)
Admission: RE | Admit: 2014-01-15 | Discharge: 2014-01-15 | Disposition: A | Discharge status: Home / Self Care | Payer: Self-pay | Source: Ambulatory Visit | Attending: Cardiovascular Disease | Admitting: Cardiovascular Disease

## 2014-01-28 DIAGNOSIS — T148 Other injury of unspecified body region: Secondary | ICD-10-CM | POA: Diagnosis not present

## 2014-03-04 ENCOUNTER — Ambulatory Visit (INDEPENDENT_AMBULATORY_CARE_PROVIDER_SITE_OTHER): Payer: Medicare Other | Admitting: Cardiovascular Disease

## 2014-03-04 ENCOUNTER — Encounter: Payer: Self-pay | Admitting: Cardiovascular Disease

## 2014-03-04 VITALS — BP 122/68 | HR 76 | Ht 68.0 in | Wt 170.0 lb

## 2014-03-04 DIAGNOSIS — R079 Chest pain, unspecified: Secondary | ICD-10-CM

## 2014-03-04 DIAGNOSIS — I6521 Occlusion and stenosis of right carotid artery: Secondary | ICD-10-CM | POA: Diagnosis not present

## 2014-03-04 DIAGNOSIS — I739 Peripheral vascular disease, unspecified: Secondary | ICD-10-CM | POA: Diagnosis not present

## 2014-03-04 DIAGNOSIS — R9431 Abnormal electrocardiogram [ECG] [EKG]: Secondary | ICD-10-CM

## 2014-03-04 DIAGNOSIS — R0600 Dyspnea, unspecified: Secondary | ICD-10-CM

## 2014-03-04 DIAGNOSIS — R0989 Other specified symptoms and signs involving the circulatory and respiratory systems: Secondary | ICD-10-CM

## 2014-03-04 DIAGNOSIS — I10 Essential (primary) hypertension: Secondary | ICD-10-CM

## 2014-03-04 NOTE — Assessment & Plan Note (Signed)
Well controlled.  Continue current medications and low sodium Dash type diet.   Feels better on lower dose of Hyzaar

## 2014-03-04 NOTE — Patient Instructions (Signed)
Your physician wants you to follow-up in:   [place] will receive a reminder letter in the mail two months in advance. If you don't receive a letter, please call our office to schedule the follow-up appointment. Your physician recommends that you continue on your current medications as directed. Please refer to the Current Medication list given to you today.  Your physician has requested that you have an echocardiogram. Echocardiography is a painless test that uses sound waves to create images of your heart. It provides your doctor with information about the size and shape of your heart and how well your heart's chambers and valves are working. This procedure takes approximately one hour. There are no restrictions for this procedure.  Your physician has requested that you have a lexiscan myoview. For further information please visit [email or web address]. Please follow instruction sheet, as given. Your physician has requested that you have an ankle brachial index (ABI). During this test an ultrasound and blood pressure cuff are used to evaluate the arteries that supply the arms and legs with blood. Allow thirty minutes for this exam. There are no restrictions or special instructions. Your physician has requested that you have a carotid duplex. This test is an ultrasound of the carotid arteries in your neck. It looks at blood flow through these arteries that supply the brain with blood. Allow one hour for this exam. There are no restrictions or special instructions.

## 2014-03-04 NOTE — Assessment & Plan Note (Signed)
Normal ECG not clear if symptoms represent angina.  Has nitro.  Lexiscan myovue

## 2014-03-04 NOTE — Assessment & Plan Note (Signed)
Normal exam except for soft SEM.  F/U echo assess RV/LV function and assess PA pressure

## 2014-03-04 NOTE — Assessment & Plan Note (Signed)
6/14 had 50-60% RICA stenosis f/u carotid duplex  ASA

## 2014-03-04 NOTE — Progress Notes (Signed)
Patient ID: Brandon [name], male   DOB: [birth year], 68 y.o.   MRN: [id number] 68 yo referred for dyspnea, fatigue and atypical chest pain. Dyspnea and fatigue have been coming on for years. Atypical sharp stabbing left sided pain last month or so is new. No positional component. 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 Spirometry normal and no chronic lung disease. No history of CAD. Reviewed his echo from 11/01/13  and normal with no valve disease EF 55-60% and estimated PA pressure 28 No pericardial effusion  ETT done by Dr Lattie Haw 07/06/12 read as borderline  Impression: Borderline positive graded exercise test with marginally significant ST segment abnormalities in the absence of chest pain or other apparent symptoms of ischemia; somewhat impaired exercise capacity, but dyspnea was not prominent at low level exercise. Oxygen saturation remained normal throughout Other findings as noted.  He continues to have mainly dyspnea with heavy activity like climbing hills in the woods or using a chain saw real hard.  Discussed options and I dont think cath inidcated at this time BP well controlled. Has not had recurrent pain Not needed to use nitro. 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   Has had more exertional dyspnea and some chest pain  One episode of presyncope made worse with nitro Feels legs get heavy with walking  Daughter indicates he gets chest heaviness with exertion more often than he is saying     ROS: Denies fever, malais, weight loss, blurry vision, decreased visual acuity, cough, sputum, SOB, hemoptysis, pleuritic pain, palpitaitons, heartburn, abdominal pain, melena, lower extremity edema, claudication, or rash.  All other systems reviewed and negative  General: Affect appropriate Healthy:  appears stated age 19: normal Neck supple with no adenopathy JVP normal right  bruits no  thyromegaly Lungs clear with no wheezing and good diaphragmatic motion Heart:  S1/S2 2/6 SEM  murmur, no rub, gallop or click PMI normal Abdomen: benighn, BS positve, no tenderness, no AAA no bruit.  No HSM or HJR Distal pulses intact with no bruits No edema Neuro non-focal Skin warm and dry No muscular weakness   Current Outpatient Prescriptions  Medication Sig Dispense Refill  . esomeprazole (NEXIUM) 20 MG capsule Take 20 mg by mouth daily before breakfast.      . losartan-hydrochlorothiazide (HYZAAR) 50-12.5 MG per tablet Take 1 tablet by mouth daily.  30 tablet  6  . nitroGLYCERIN (NITROSTAT) 0.4 MG SL tablet Place 1 tablet (0.4 mg total) under the tongue every 5 (five) minutes as needed for chest pain.  25 tablet  1  . zolpidem (AMBIEN) 5 MG tablet Take 5 mg by mouth at bedtime as needed.        No current facility-administered medications for this visit.    Allergies  Ciprofloxacin  Electrocardiogram:  SR rate 65 normal   Assessment and Plan

## 2014-03-11 ENCOUNTER — Ambulatory Visit (HOSPITAL_COMMUNITY): Payer: Medicare Other | Attending: Internal Medicine | Admitting: Radiology

## 2014-03-11 ENCOUNTER — Ambulatory Visit (HOSPITAL_BASED_OUTPATIENT_CLINIC_OR_DEPARTMENT_OTHER): Payer: Medicare Other | Admitting: *Deleted

## 2014-03-11 ENCOUNTER — Ambulatory Visit (HOSPITAL_BASED_OUTPATIENT_CLINIC_OR_DEPARTMENT_OTHER): Payer: Medicare Other | Admitting: Cardiology

## 2014-03-11 VITALS — BP 124/83 | HR 63

## 2014-03-11 DIAGNOSIS — I10 Essential (primary) hypertension: Secondary | ICD-10-CM | POA: Insufficient documentation

## 2014-03-11 DIAGNOSIS — R0602 Shortness of breath: Secondary | ICD-10-CM | POA: Diagnosis not present

## 2014-03-11 DIAGNOSIS — I6523 Occlusion and stenosis of bilateral carotid arteries: Secondary | ICD-10-CM

## 2014-03-11 DIAGNOSIS — I739 Peripheral vascular disease, unspecified: Secondary | ICD-10-CM

## 2014-03-11 DIAGNOSIS — R9431 Abnormal electrocardiogram [ECG] [EKG]: Secondary | ICD-10-CM | POA: Diagnosis not present

## 2014-03-11 DIAGNOSIS — R5383 Other fatigue: Secondary | ICD-10-CM | POA: Insufficient documentation

## 2014-03-11 DIAGNOSIS — R0600 Dyspnea, unspecified: Secondary | ICD-10-CM

## 2014-03-11 DIAGNOSIS — I6521 Occlusion and stenosis of right carotid artery: Secondary | ICD-10-CM

## 2014-03-11 DIAGNOSIS — R202 Paresthesia of skin: Secondary | ICD-10-CM | POA: Diagnosis not present

## 2014-03-11 MED ORDER — TECHNETIUM TC 99M SESTAMIBI GENERIC - CARDIOLITE
11.0000 | Freq: Once | INTRAVENOUS | Status: AC | PRN
  Administered 2014-03-11: 11 via INTRAVENOUS

## 2014-03-11 NOTE — Progress Notes (Signed)
[facility] [address] [place], [place] [address] [phone]    Cardiology Nuclear Med Study  Brandon [name] is a 68 y.o. male     MRN : [id number]     DOB: 1946-05-08  Procedure Date: 03/11/2014  Nuclear Med Background Indication for Stress Test:  Evaluation for Ischemia History:  No known CAD Cardiac Risk Factors: Carotid Disease and Hypertension  Symptoms:  DOE and Fatigue   Nuclear Pre-Procedure Caffeine/Decaff Intake:  None NPO After: 8:00pm   Lungs:  clear O2 Sat: 94% on room air. IV 0.9% NS with Angio Cath:  22g  IV Site: R Hand  IV Started by:  Crissie Figures, RN  Chest Size (in):  40 Cup Size: n/a  Height:    Weight:     BMI:  There is no weight on file to calculate BMI. Tech Comments:  Rest only done on 03-11-2014 due to caffeine. Irven Baltimore, RN.    Nuclear Med Study 1 or 2 day study: 2 day  Stress Test Type:  Treadmill/Lexiscan  Reading MD: Ottie Glazier, MD  Order Authorizing Provider:  [name], MD  Resting Radionuclide: Technetium 17m Sestamibi  Resting Radionuclide Dose: 11.0 mCi   Stress Radionuclide:  Technetium 50m Sestamibi  Stress Radionuclide Dose: 33.0 mCi           Stress Protocol Rest HR: 63 Stress HR: 91  Rest BP: 124/83 Stress BP: 125/88  Exercise Time (min): n/a METS: n/a           Dose of Adenosine (mg):  n/a Dose of Lexiscan: 0.4 mg  Dose of Atropine (mg): n/a Dose of Dobutamine: n/a mcg/kg/min (at max HR)  Stress Test Technologist: Glade Lloyd, BS-ES  Nuclear Technologist:  Earl Many, CNMT     Rest Procedure:  Myocardial perfusion imaging was performed at rest 45 minutes following the intravenous administration of Technetium 74m Sestamibi. Rest ECG: NSR - Normal EKG  Stress Procedure:  The patient received IV Lexiscan 0.4 mg over 15-seconds with concurrent low level exercise and then Technetium 73m Sestamibi was injected at 30-seconds while the patient continued walking one more  minute.  Quantitative spect images were obtained after a 45-minute delay.  During the infusion of Lexiscan the patient complained of SOB, upper back pain and headache.  These symptoms began to resolve in recovery.  Stress ECG: No significant change from baseline ECG  QPS Raw Data Images:  There is interference from nuclear activity from structures below the diaphragm. This does not affect the ability to read the study. Stress Images:  There is decreased uptake in the basal, mid and apical inferior walls, and basal and mid inferolateral walls.  Rest Images:  Decrease uptake in the basal, mid and distal inferior wall.  Subtraction (SDS):  SDS 4. Transient Ischemic Dilatation (Normal <1.22):  1.07 Lung/Heart Ratio (Normal <0.45):  0.29  Quantitative Gated Spect Images QGS EDV:  74 ml QGS ESV:  27 ml  Impression Exercise Capacity:  Lexiscan with low level exercise. BP Response:  Normal blood pressure response. Clinical Symptoms:  There is dyspnea. ECG Impression:  No significant ST segment change suggestive of ischemia. Comparison with Prior Nuclear Study: No previous nuclear study performed  Overall Impression:  Intermediate risk stress nuclear study with prior infarct in the LCX territory with periinfarct ischemia (SDS 4)..  LV Ejection Fraction: 64%.  LV Wall Motion:  Hypokinesis in the basal inferior wall.    [name] 03/13/2014

## 2014-03-11 NOTE — Progress Notes (Signed)
Lower Arterial Performed

## 2014-03-11 NOTE — Progress Notes (Signed)
Carotid Duplex Performed 

## 2014-03-11 NOTE — Progress Notes (Signed)
Echo performed. 

## 2014-03-13 ENCOUNTER — Ambulatory Visit (HOSPITAL_COMMUNITY): Payer: Medicare Other | Attending: Internal Medicine

## 2014-03-13 DIAGNOSIS — R0789 Other chest pain: Secondary | ICD-10-CM

## 2014-03-13 DIAGNOSIS — R0989 Other specified symptoms and signs involving the circulatory and respiratory systems: Secondary | ICD-10-CM

## 2014-03-13 MED ORDER — TECHNETIUM TC 99M SESTAMIBI GENERIC - CARDIOLITE
10.0000 | Freq: Once | INTRAVENOUS | Status: AC | PRN
  Administered 2014-03-13: 10 via INTRAVENOUS

## 2014-03-13 MED ORDER — TECHNETIUM TC 99M SESTAMIBI GENERIC - CARDIOLITE
33.0000 | Freq: Once | INTRAVENOUS | Status: AC | PRN
  Administered 2014-03-13: 33 via INTRAVENOUS

## 2014-03-13 MED ORDER — REGADENOSON 0.4 MG/5ML IV SOLN
0.4000 mg | Freq: Once | INTRAVENOUS | Status: AC
  Administered 2014-03-13: 0.4 mg via INTRAVENOUS

## 2014-03-14 ENCOUNTER — Other Ambulatory Visit: Payer: Self-pay | Admitting: *Deleted

## 2014-03-14 ENCOUNTER — Encounter: Payer: Self-pay | Admitting: *Deleted

## 2014-03-14 DIAGNOSIS — R0600 Dyspnea, unspecified: Secondary | ICD-10-CM

## 2014-03-14 DIAGNOSIS — Z79899 Other long term (current) drug therapy: Secondary | ICD-10-CM

## 2014-03-14 DIAGNOSIS — Z01818 Encounter for other preprocedural examination: Secondary | ICD-10-CM

## 2014-03-14 DIAGNOSIS — R079 Chest pain, unspecified: Secondary | ICD-10-CM

## 2014-03-17 ENCOUNTER — Telehealth: Payer: Self-pay | Admitting: Cardiovascular Disease

## 2014-03-17 NOTE — Telephone Encounter (Signed)
PT  CALLED WANTING TO KNOW IF  DR Johnsie Cancel  WAS  PERFORMING  CATH   REVIEWED  CATH  INSTRUCTIONS  AND  DR Johnsie Cancel  IS  DOING  PROCEDURE PT  AWARE  .[name]

## 2014-03-17 NOTE — Telephone Encounter (Signed)
New message     Patient calling has several questions for the nurse will discuss when the nurse call back.

## 2014-03-18 ENCOUNTER — Other Ambulatory Visit: Payer: Self-pay | Admitting: Cardiovascular Disease

## 2014-03-18 DIAGNOSIS — I209 Angina pectoris, unspecified: Secondary | ICD-10-CM

## 2014-03-19 ENCOUNTER — Encounter (HOSPITAL_COMMUNITY): Payer: Self-pay | Admitting: Pharmacy Technician

## 2014-03-31 ENCOUNTER — Other Ambulatory Visit (INDEPENDENT_AMBULATORY_CARE_PROVIDER_SITE_OTHER): Payer: Medicare Other | Admitting: *Deleted

## 2014-03-31 DIAGNOSIS — R079 Chest pain, unspecified: Secondary | ICD-10-CM

## 2014-03-31 DIAGNOSIS — R0600 Dyspnea, unspecified: Secondary | ICD-10-CM

## 2014-03-31 DIAGNOSIS — Z79899 Other long term (current) drug therapy: Secondary | ICD-10-CM

## 2014-03-31 DIAGNOSIS — Z01818 Encounter for other preprocedural examination: Secondary | ICD-10-CM

## 2014-03-31 LAB — BASIC METABOLIC PANEL
BUN: 16 mg/dL (ref 6–23)
CHLORIDE: 102 meq/L (ref 96–112)
CO2: 25 mEq/L (ref 19–32)
Calcium: 9.5 mg/dL (ref 8.4–10.5)
Creatinine, Ser: 1.3 mg/dL (ref 0.4–1.5)
GFR: 59.57 mL/min — AB (ref >60.00)
GLUCOSE: 129 mg/dL — AB (ref 70–99)
POTASSIUM: 4.2 meq/L (ref 3.5–5.1)
Sodium: 135 mEq/L (ref 135–145)

## 2014-03-31 LAB — CBC WITH DIFFERENTIAL/PLATELET
Basophils Absolute: 0 10*3/uL (ref 0.0–0.1)
Basophils Relative: 0.3 % (ref 0.0–3.0)
EOS PCT: 0.9 % (ref 0.0–5.0)
Eosinophils Absolute: 0.1 10*3/uL (ref 0.0–0.7)
HCT: 43.2 % (ref 39.0–52.0)
Hemoglobin: 14.3 g/dL (ref 13.0–17.0)
LYMPHS ABS: 2.6 10*3/uL (ref 0.7–4.0)
LYMPHS PCT: 22.8 % (ref 12.0–46.0)
MCHC: 33 g/dL (ref 30.0–36.0)
MCV: 90.7 fl (ref 78.0–100.0)
Monocytes Absolute: 0.5 10*3/uL (ref 0.1–1.0)
Monocytes Relative: 4.5 % (ref 3.0–12.0)
NEUTROS ABS: 8.1 10*3/uL — AB (ref 1.4–7.7)
Neutrophils Relative %: 71.5 % (ref 43.0–77.0)
Platelets: 248 10*3/uL (ref 150.0–400.0)
RBC: 4.77 Mil/uL (ref 4.22–5.81)
RDW: 12.6 % (ref 11.5–15.5)
WBC: 11.3 10*3/uL — ABNORMAL HIGH (ref 4.0–10.5)

## 2014-03-31 LAB — PROTIME-INR
INR: 1 ratio (ref 0.8–1.0)
Prothrombin Time: 11 s (ref 9.6–13.1)

## 2014-04-03 ENCOUNTER — Ambulatory Visit (HOSPITAL_COMMUNITY)
Admission: RE | Admit: 2014-04-03 | Discharge: 2014-04-04 | Disposition: A | Discharge status: Home / Self Care | Payer: Medicare Other | Source: Ambulatory Visit | Attending: Cardiovascular Disease | Admitting: Cardiovascular Disease

## 2014-04-03 ENCOUNTER — Encounter (HOSPITAL_COMMUNITY): Payer: Self-pay | Admitting: General Practice

## 2014-04-03 ENCOUNTER — Encounter (HOSPITAL_COMMUNITY)
Admission: RE | Disposition: A | Discharge status: Home / Self Care | Payer: Medicare Other | Source: Ambulatory Visit | Attending: Cardiovascular Disease

## 2014-04-03 DIAGNOSIS — I25110 Atherosclerotic heart disease of native coronary artery with unstable angina pectoris: Secondary | ICD-10-CM | POA: Diagnosis not present

## 2014-04-03 DIAGNOSIS — I200 Unstable angina: Secondary | ICD-10-CM | POA: Diagnosis present

## 2014-04-03 DIAGNOSIS — Z955 Presence of coronary angioplasty implant and graft: Secondary | ICD-10-CM

## 2014-04-03 DIAGNOSIS — Z9861 Coronary angioplasty status: Secondary | ICD-10-CM

## 2014-04-03 DIAGNOSIS — R079 Chest pain, unspecified: Secondary | ICD-10-CM | POA: Diagnosis present

## 2014-04-03 DIAGNOSIS — I209 Angina pectoris, unspecified: Secondary | ICD-10-CM

## 2014-04-03 DIAGNOSIS — I2510 Atherosclerotic heart disease of native coronary artery without angina pectoris: Secondary | ICD-10-CM

## 2014-04-03 HISTORY — DX: Atherosclerotic heart disease of native coronary artery without angina pectoris: I25.10

## 2014-04-03 HISTORY — PX: PERCUTANEOUS CORONARY STENT INTERVENTION (PCI-S): SHX5485

## 2014-04-03 HISTORY — DX: Coronary angioplasty status: Z98.61

## 2014-04-03 HISTORY — PX: CORONARY STENT PLACEMENT: SHX1402

## 2014-04-03 HISTORY — DX: Unstable angina: I20.0

## 2014-04-03 HISTORY — PX: LEFT HEART CATHETERIZATION WITH CORONARY ANGIOGRAM: SHX5451

## 2014-04-03 LAB — POCT ACTIVATED CLOTTING TIME: ACTIVATED CLOTTING TIME: 332 s

## 2014-04-03 SURGERY — LEFT HEART CATHETERIZATION WITH CORONARY ANGIOGRAM
Anesthesia: LOCAL

## 2014-04-03 MED ORDER — LOSARTAN POTASSIUM-HCTZ 50-12.5 MG PO TABS: 1.0000 | ORAL_TABLET | Freq: Every day | ORAL | Status: DC

## 2014-04-03 MED ORDER — ASPIRIN 81 MG PO CHEW
CHEWABLE_TABLET | ORAL | Status: AC
  Filled 2014-04-03: qty 1

## 2014-04-03 MED ORDER — HYDROCHLOROTHIAZIDE 12.5 MG PO CAPS
12.5000 mg | ORAL_CAPSULE | Freq: Every day | ORAL | Status: DC
  Administered 2014-04-04: 10:00:00 12.5 mg via ORAL
  Filled 2014-04-03: qty 1

## 2014-04-03 MED ORDER — MIDAZOLAM HCL 2 MG/2ML IJ SOLN
INTRAMUSCULAR | Status: AC
  Filled 2014-04-03: qty 2

## 2014-04-03 MED ORDER — ASPIRIN 81 MG PO CHEW
81.0000 mg | CHEWABLE_TABLET | Freq: Every day | ORAL | Status: DC
  Administered 2014-04-04: 10:00:00 81 mg via ORAL
  Filled 2014-04-03: qty 1

## 2014-04-03 MED ORDER — ONDANSETRON HCL 4 MG/2ML IJ SOLN: 4.0000 mg | Freq: Four times a day (QID) | INTRAMUSCULAR | Status: DC | PRN

## 2014-04-03 MED ORDER — NITROGLYCERIN 1 MG/10 ML FOR IR/CATH LAB
INTRA_ARTERIAL | Status: AC
  Filled 2014-04-03: qty 10

## 2014-04-03 MED ORDER — GI COCKTAIL ~~LOC~~
30.0000 mL | Freq: Once | ORAL | Status: AC
Start: 2014-04-03 — End: 2014-04-03
  Administered 2014-04-03: 30 mL via ORAL
  Filled 2014-04-03: qty 30

## 2014-04-03 MED ORDER — FENTANYL CITRATE 0.05 MG/ML IJ SOLN
INTRAMUSCULAR | Status: AC
  Filled 2014-04-03: qty 2

## 2014-04-03 MED ORDER — PANTOPRAZOLE SODIUM 40 MG PO TBEC
40.0000 mg | DELAYED_RELEASE_TABLET | Freq: Every day | ORAL | Status: DC
  Administered 2014-04-03 – 2014-04-04 (×2): 40 mg via ORAL
  Filled 2014-04-03 (×2): qty 1

## 2014-04-03 MED ORDER — HEPARIN SODIUM (PORCINE) 1000 UNIT/ML IJ SOLN
INTRAMUSCULAR | Status: AC
  Filled 2014-04-03: qty 1

## 2014-04-03 MED ORDER — SODIUM CHLORIDE 0.9 % IV SOLN: INTRAVENOUS | Status: AC

## 2014-04-03 MED ORDER — BIVALIRUDIN 250 MG IV SOLR
INTRAVENOUS | Status: AC
  Filled 2014-04-03: qty 250

## 2014-04-03 MED ORDER — SODIUM CHLORIDE 0.9 % IJ SOLN: 3.0000 mL | INTRAMUSCULAR | Status: DC | PRN

## 2014-04-03 MED ORDER — SODIUM CHLORIDE 0.9 % IV SOLN
250.0000 mL | INTRAVENOUS | Status: DC | PRN
  Administered 2014-04-03: 09:00:00 via INTRAVENOUS

## 2014-04-03 MED ORDER — LOSARTAN POTASSIUM 50 MG PO TABS
50.0000 mg | ORAL_TABLET | Freq: Every day | ORAL | Status: DC
  Administered 2014-04-04: 50 mg via ORAL
  Filled 2014-04-03: qty 1

## 2014-04-03 MED ORDER — CLOPIDOGREL BISULFATE 75 MG PO TABS
75.0000 mg | ORAL_TABLET | Freq: Every day | ORAL | Status: DC
  Administered 2014-04-04: 75 mg via ORAL
  Filled 2014-04-03: qty 1

## 2014-04-03 MED ORDER — ASPIRIN 81 MG PO CHEW
81.0000 mg | CHEWABLE_TABLET | ORAL | Status: AC
  Administered 2014-04-03: 81 mg via ORAL

## 2014-04-03 MED ORDER — LIDOCAINE HCL (PF) 1 % IJ SOLN
INTRAMUSCULAR | Status: AC
  Filled 2014-04-03: qty 30

## 2014-04-03 MED ORDER — ACETAMINOPHEN 325 MG PO TABS: 650.0000 mg | ORAL_TABLET | ORAL | Status: DC | PRN

## 2014-04-03 MED ORDER — HEPARIN (PORCINE) IN NACL 2-0.9 UNIT/ML-% IJ SOLN
INTRAMUSCULAR | Status: AC
  Filled 2014-04-03: qty 1000

## 2014-04-03 MED ORDER — VERAPAMIL HCL 2.5 MG/ML IV SOLN
INTRAVENOUS | Status: AC
  Filled 2014-04-03: qty 2

## 2014-04-03 MED ORDER — SODIUM CHLORIDE 0.9 % IJ SOLN: 3.0000 mL | Freq: Two times a day (BID) | INTRAMUSCULAR | Status: DC

## 2014-04-03 MED ORDER — CLOPIDOGREL BISULFATE 300 MG PO TABS
ORAL_TABLET | ORAL | Status: AC
  Filled 2014-04-03: qty 2

## 2014-04-03 MED ORDER — FAMOTIDINE IN NACL 20-0.9 MG/50ML-% IV SOLN
INTRAVENOUS | Status: AC
  Filled 2014-04-03: qty 50

## 2014-04-03 MED ORDER — ATORVASTATIN CALCIUM 80 MG PO TABS
80.0000 mg | ORAL_TABLET | Freq: Every day | ORAL | Status: DC
  Administered 2014-04-03: 19:00:00 80 mg via ORAL
  Filled 2014-04-03 (×2): qty 1

## 2014-04-03 NOTE — CV Procedure (Signed)
   Cardiac Catheterization Procedure Note  Name: Brandon [name] MRN: [id number] DOB: 04-Oct-1945  Procedure: Left Heart Cath, Selective Coronary Angiography, LV angiography  Indication:  Dyspnea Chest Pain abnormal myovue   Procedural details: The right groin was prepped, draped, and anesthetized with 1% lidocaine. Using modified Seldinger technique, a 5 French sheath was introduced into the right femoral artery. Standard Judkins catheters were used for coronary angiography and left ventriculography. Catheter exchanges were performed over a guidewire. There were no immediate procedural complications. The patient was transferred to the post catheterization recovery area for further monitoring.  Procedural Findings: Hemodynamics:  AO 115 61 LV 121 8   Coronary angiography: Coronary dominance: right  Left mainstem: Normal  Left anterior descending (LAD): 30% proximal and mid 70% distal after large D2   D1: small normal  D2: large 30% ostial stenosis  Left circumflex (LCx):  30% proximal   OM1:  70% proximal stenosis  Right coronary artery (RCA): 30% proximal mid and distal  5 large PDA/PLA branches normal and superdominant   Left ventriculography: Left ventricular systolic function is normal with mild inferobasal hypokinesis    ? Moderate grade 2/4 mitral regurgitation    Impression:  2 vessel CAD  Both amenable to PCI/Stent  Reviewed with CM who will proceed.  F/u echo for degree of MR EDP low OM ischemia may contribute to mechanism of MR    [name] 04/03/2014, 11:27 AM

## 2014-04-03 NOTE — CV Procedure (Signed)
     Cardiac Catheterization Operative Report  Brandon [name] [id number] 11/5/201512:20 PM Alonza Bogus, MD  Procedure Performed:  1. PTCA/DES x 1 mid LAD 2. PTCA/DES x 1 OM1 3. Angioseal Right femoral artery  Operator: Lauree Chandler, MD  Indication:   Unstable angina. Diagnostic cath this am per Dr. Johnsie Cancel and pt found to have severe disease in the mid LAD and OM1.                                  Procedure Details: The risks, benefits, complications, treatment options, and expected outcomes were discussed with the patient. The patient and/or family concurred with the proposed plan, giving informed consent. When I entered the case there was a 6 [place] sheath present in the right groin. He was given a weight based bolus of Angiomax and a drip was started. He was given Plavix 600 mg po x 1. I then engaged the left main with a XB LAD 3.5 guiding catheter.    Lesion #1: (OM 1)When the ACT was over 200, I passed a Cougar IC wire down the Circumflex. I then pre-dilated the stenosis with a 2.0 x 10 mm balloon x 1. I then deployed a 2.25 x 12 mm Promus Premier DES in the OM1. The stent was post-dilated with a 2.5 x 8 mm [place] balloon x 1. Stenosis taken from 90% down to 0%. Good flow down vessel.   Lesion #2: (mid LAD) I then pulled my wire back and advanced it down the LAD. A 2.0 x 10 mm balloon was used to pre-dilate the stenosis in the mid LAD. A 2.5 x 16 mm Promus Premier DES was deployed in the mid LAD. The stent was post-dilated with a 2.75 x 12 mm [place] balloon x 1. The stenosis was taken from 80% down to 0%. There was good flow down the vessel. Guide and wire removed  Angioseal deployed in right femoral artery. There were no immediate complications. The patient was taken to the recovery area in stable condition.   Hemodynamic Findings: Central aortic pressure: 131/69  Impression: 1. Unstable angina 2. Severe stenosis OM 1 now s/p successful PTCA/DES x 1 OM1 3. Severe stenosis  mid LAD now s/p successful PTCA/DES x 1 mid LAD  Recommendations: ASA and plavix for one year. Continue statin and beta blocker.        Complications:  None; patient tolerated the procedure well.

## 2014-04-03 NOTE — H&P (View-Only) (Signed)
Patient ID: Brandon [name], male   DOB: 03/29/1946, 68 y.o.   MRN: [id number] 68 yo referred for dyspnea, fatigue and atypical chest pain. Dyspnea and fatigue have been coming on for years. Atypical sharp stabbing left sided pain last month or so is new. No positional component. 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 Spirometry normal and no chronic lung disease. No history of CAD. Reviewed his echo from 11/01/13  and normal with no valve disease EF 55-60% and estimated PA pressure 28 No pericardial effusion  ETT done by Dr Lattie Haw 07/06/12 read as borderline  Impression: Borderline positive graded exercise test with marginally significant ST segment abnormalities in the absence of chest pain or other apparent symptoms of ischemia; somewhat impaired exercise capacity, but dyspnea was not prominent at low level exercise. Oxygen saturation remained normal throughout Other findings as noted.  He continues to have mainly dyspnea with heavy activity like climbing hills in the woods or using a chain saw real hard.  Discussed options and I dont think cath inidcated at this time BP well controlled. Has not had recurrent pain Not needed to use nitro. 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   Has had more exertional dyspnea and some chest pain  One episode of presyncope made worse with nitro Feels legs get heavy with walking  Daughter indicates he gets chest heaviness with exertion more often than he is saying     ROS: Denies fever, malais, weight loss, blurry vision, decreased visual acuity, cough, sputum, SOB, hemoptysis, pleuritic pain, palpitaitons, heartburn, abdominal pain, melena, lower extremity edema, claudication, or rash.  All other systems reviewed and negative  General: Affect appropriate Healthy:  appears stated age 68: normal Neck supple with no adenopathy JVP normal right  bruits no  thyromegaly Lungs clear with no wheezing and good diaphragmatic motion Heart:  S1/S2 2/6 SEM  murmur, no rub, gallop or click PMI normal Abdomen: benighn, BS positve, no tenderness, no AAA no bruit.  No HSM or HJR Distal pulses intact with no bruits No edema Neuro non-focal Skin warm and dry No muscular weakness   Current Outpatient Prescriptions  Medication Sig Dispense Refill  . esomeprazole (NEXIUM) 20 MG capsule Take 20 mg by mouth daily before breakfast.      . losartan-hydrochlorothiazide (HYZAAR) 50-12.5 MG per tablet Take 1 tablet by mouth daily.  30 tablet  6  . nitroGLYCERIN (NITROSTAT) 0.4 MG SL tablet Place 1 tablet (0.4 mg total) under the tongue every 5 (five) minutes as needed for chest pain.  25 tablet  1  . zolpidem (AMBIEN) 5 MG tablet Take 5 mg by mouth at bedtime as needed.        No current facility-administered medications for this visit.    Allergies  Ciprofloxacin  Electrocardiogram:  SR rate 65 normal   Assessment and Plan

## 2014-04-03 NOTE — Interval H&P Note (Signed)
History and Physical Interval Note:  04/03/2014 10:30 AM  Brandon [name]  has presented today for surgery, with the diagnosis of abnormal myoview  The various methods of treatment have been discussed with the patient and family. After consideration of risks, benefits and other options for treatment, the patient has consented to  Procedure(s): LEFT HEART CATHETERIZATION WITH CORONARY ANGIOGRAM (N/A) as a surgical intervention .  The patient's history has been reviewed, patient examined, no change in status, stable for surgery.  I have reviewed the patient's chart and labs.  Questions were answered to the patient's satisfaction.     [name]  Patient with increasing dyspnea and atypical chest pain  03/11/14  myovue abnormal with lateral infarct and ischemia Cath indicated to assess progression of known CAD  [name]

## 2014-04-04 ENCOUNTER — Telehealth: Payer: Self-pay | Admitting: Physician Assistant

## 2014-04-04 DIAGNOSIS — I2510 Atherosclerotic heart disease of native coronary artery without angina pectoris: Secondary | ICD-10-CM

## 2014-04-04 DIAGNOSIS — Z9861 Coronary angioplasty status: Secondary | ICD-10-CM | POA: Diagnosis not present

## 2014-04-04 DIAGNOSIS — I200 Unstable angina: Secondary | ICD-10-CM | POA: Diagnosis not present

## 2014-04-04 DIAGNOSIS — I25110 Atherosclerotic heart disease of native coronary artery with unstable angina pectoris: Secondary | ICD-10-CM | POA: Diagnosis not present

## 2014-04-04 LAB — BASIC METABOLIC PANEL
ANION GAP: 14 (ref 5–15)
BUN: 18 mg/dL (ref 6–23)
CHLORIDE: 103 meq/L (ref 96–112)
CO2: 22 mEq/L (ref 19–32)
CREATININE: 1.16 mg/dL (ref 0.50–1.35)
Calcium: 9.2 mg/dL (ref 8.4–10.5)
GFR calc non Af Amer: 62 mL/min — ABNORMAL LOW (ref >90)
GFR, EST AFRICAN AMERICAN: 72 mL/min — AB (ref >90)
Glucose, Bld: 105 mg/dL — ABNORMAL HIGH (ref 70–99)
POTASSIUM: 4.4 meq/L (ref 3.7–5.3)
Sodium: 139 mEq/L (ref 137–147)

## 2014-04-04 LAB — CBC
HCT: 41.3 % (ref 39.0–52.0)
HEMOGLOBIN: 13.7 g/dL (ref 13.0–17.0)
MCH: 29.3 pg (ref 26.0–34.0)
MCHC: 33.2 g/dL (ref 30.0–36.0)
MCV: 88.4 fL (ref 78.0–100.0)
Platelets: 191 10*3/uL (ref 150–400)
RBC: 4.67 MIL/uL (ref 4.22–5.81)
RDW: 12.6 % (ref 11.5–15.5)
WBC: 8.2 10*3/uL (ref 4.0–10.5)

## 2014-04-04 MED ORDER — PANTOPRAZOLE SODIUM 40 MG PO TBEC: 40.0000 mg | DELAYED_RELEASE_TABLET | Freq: Every day | ORAL | Status: DC

## 2014-04-04 MED ORDER — ASPIRIN 81 MG PO CHEW: 81.0000 mg | CHEWABLE_TABLET | Freq: Every day | ORAL | Status: DC

## 2014-04-04 MED ORDER — CLOPIDOGREL BISULFATE 75 MG PO TABS: 75.0000 mg | ORAL_TABLET | Freq: Every day | ORAL | Status: DC

## 2014-04-04 MED ORDER — ATORVASTATIN CALCIUM 80 MG PO TABS: 80.0000 mg | ORAL_TABLET | Freq: Every day | ORAL | Status: DC

## 2014-04-04 MED FILL — Sodium Chloride IV Soln 0.9%: INTRAVENOUS | Qty: 50 | Status: AC

## 2014-04-04 NOTE — Progress Notes (Signed)
Patient reports soreness to right groin (angioseal site) when ambulating.  MD/PA aware.  Patient declined pain med.

## 2014-04-04 NOTE — Progress Notes (Signed)
Primary Cardiologist: Dr. Johnsie Cancel  Patient Profile: 68 y/o male admitted for LHC in the setting of unstable angina. S/P PCI to LAD and OM1.    Subjective: Denies chest pain and dyspnea. Right groin is mildly tender but no significant pain.    Objective: Vital signs in last 24 hours: Temp:  [97.6 F (36.4 C)-98.9 F (37.2 C)] 98.1 F (36.7 C) (11/06 0426) Pulse Rate:  [54-69] 61 (11/06 0426) Resp:  [10-18] 18 (11/06 0426) BP: (87-129)/(53-90) 103/53 mmHg (11/06 0426) SpO2:  [95 %-100 %] 96 % (11/06 0426) Weight:  [168 lb (76.204 kg)-169 lb 15.6 oz (77.1 kg)] 169 lb 15.6 oz (77.1 kg) (11/05 2342) Last BM Date: 04/02/14  Intake/Output from previous day: 11/05 0701 - 11/06 0700 In: 310 [I.V.:310] Out: 1260 [Urine:1260] Intake/Output this shift:    Medications Current Facility-Administered Medications  Medication Dose Route Frequency Provider Last Rate Last Dose  . acetaminophen (TYLENOL) tablet 650 mg  650 mg Oral Q4H PRN [name], MD      . aspirin chewable tablet 81 mg  81 mg Oral Daily [name], MD      . atorvastatin (LIPITOR) tablet 80 mg  80 mg Oral q1800 [name], MD   80 mg at 04/03/14 1900  . clopidogrel (PLAVIX) tablet 75 mg  75 mg Oral Q breakfast [name], MD      . losartan (COZAAR) tablet 50 mg  50 mg Oral Daily Josue Hector, MD       And  . hydrochlorothiazide (MICROZIDE) capsule 12.5 mg  12.5 mg Oral Daily Josue Hector, MD      . ondansetron [facility]) injection 4 mg  4 mg Intravenous Q6H PRN [name], MD      . pantoprazole (PROTONIX) EC tablet 40 mg  40 mg Oral Q0600 Brett Canales, PA-C   40 mg at 04/04/14 1941    PE: General appearance: alert, cooperative and no distress Lungs: clear to auscultation bilaterally Heart: regular rate and rhythm, S1, S2 normal, no murmur, click, rub or gallop Extremities: no LEE Pulses: 2+ and symmetric Skin: warm and dry Neurologic: Grossly  normal  Lab Results:   Recent Labs  04/04/14 0330  WBC 8.2  HGB 13.7  HCT 41.3  PLT 191   BMET  Recent Labs  04/04/14 0330  NA 139  K 4.4  CL 103  CO2 22  GLUCOSE 105*  BUN 18  CREATININE 1.16  CALCIUM 9.2    Studies/Results:  LHC 04/03/14  Procedural Findings: Hemodynamics:  AO 115 61 LV 121 8  Coronary angiography: Coronary dominance: right  Left mainstem: Normal  Left anterior descending (LAD): 30% proximal and mid 70% distal after large D2  D1: small normal D2: large 30% ostial stenosis  Left circumflex (LCx): 30% proximal  OM1: 70% proximal stenosis  Right coronary artery (RCA): 30% proximal mid and distal 5 large PDA/PLA branches normal and superdominant   Left ventriculography: Left ventricular systolic function is normal with mild inferobasal hypokinesis ? Moderate grade 2/4 mitral regurgitation   Assessment/Plan  Active Problems:   Unstable angina   CAD S/P percutaneous coronary angioplasty: DES PCI to mLAD & OM1   1. CAD: s/p PTCA/ DES x 1 to mid LAD and PTCA/DES x 1 to OM1. Continue DAPT with ASA + Plavix for a minimum of 1 year. Continue statin and BB.   2. Unstable Angina: 2/2 problem # 1. Denies recurrent chest pain. Will have patient  ambulate with cardiac rehab prior to discharge.   3. Post Cath: access sites are stable. Renal function is stable.      LOS: 1 day    Brittainy M. Ladoris Gene 04/04/2014 7:09 AM   Patient seen and examined. Agree with assessment and plan. No recurrent chest pain. Feels well with stable hemodynamics. Will dc today and f/u with Dr. Johnsie Cancel.   Troy Sine, MD, [facility] 04/04/2014 10:15 AM

## 2014-04-04 NOTE — Telephone Encounter (Signed)
Patient called answering service after hours. He had a heart catheterization yesterday. He says they tried going into his arm but were unsuccessful so did a groin stick. He is calling today to discuss mild changes to his arm cath site. He has mild swelling around the cath site as well as some redness and bruising locally. He was wondering if that was normal. No circumferential arm swelling, tenderness, pain, fever, chills, chest pain or dyspnea. He feels fine otherwise. I told him unfortunately its very difficult to evaluate vascular things over the phone and recommended for him to consider going to the ER for definitive evaluation to exclude problem to the blood vessel including clot or other deformity. I told him the other option is to take it easy and try warm compresses or ice to the area to see if this helps, and if no change in the morning, to come into the ER so we can take a look. He does not want to come tonight so elected the latter option. I reminded him this came with the risk of [place] not being able to evaluate this in person and he verbalized understanding. He promised to come to the ER tonight if symptoms change or worsen.  Dayna Dunn PA-C

## 2014-04-04 NOTE — Discharge Summary (Signed)
Physician Discharge Summary  Patient ID: Brandon [name] MRN: [id number] DOB/AGE: 05-Aug-1945 68 y.o.  Admit date: 04/03/2014 Discharge date: 04/04/2014  Admission Diagnoses: Unstable Angina  Discharge Diagnoses:  Active Problems:   Unstable angina   CAD S/P percutaneous coronary angioplasty: DES PCI to mLAD & OM1   Discharged Condition: stable  Hospital Course: The patient is a 68 y/o male followed by Dr. Johnsie Cancel, admitted to [facility] on 04/03/14 for [place] in the setting of unstable angina. He was found to have severe disease in the mid LAD and OM1. Both lesions were successfully treated with PTCA and stenting utilizing DES. Left ventricular systolic function was noted to be normal with mild inferobasal hypokinesis. He tolerated the procedure well and left the cath lab in stable condition. He was placed on DAPT with ASA + Plavix. He was continued on a statin and ARB. His PPI was changed from Nexium to Protonix in the setting of concomitant Plavix therapy. He had no recurrent chest pain and no post cath complications. His right femoral cath site remained stable and free from complication. He had no difficulties ambulating with cardiac rehab. Vital signs and renal function remained stable. He was last seen and examined by Dr. Claiborne Billings, who determined he was stable for discharge home. Post-hospital follow-up has been arranged with Ermalinda Barrios PA-C for 04/14/13.   Consults: None  Significant Diagnostic Studies:   LHC 04/03/14 Procedural Findings: Hemodynamics:  AO 115 61 LV 121 8  Coronary angiography: Coronary dominance: right  Left mainstem: Normal  Left anterior descending (LAD): 30% proximal and mid 70% distal after large D2  D1: small normal D2: large 30% ostial stenosis  Left circumflex (LCx): 30% proximal  OM1: 70% proximal stenosis  Right coronary artery (RCA): 30% proximal mid and distal 5 large PDA/PLA branches normal and  superdominant   Left ventriculography: Left ventricular systolic function is normal with mild inferobasal hypokinesis ? Moderate grade 2/4 mitral regurgitation    Treatments: See Hospital Course  Discharge Exam: Blood pressure 115/72, pulse 71, temperature 98.3 F (36.8 C), temperature source Oral, resp. rate 18, height 5\' 8"  (1.727 m), weight 169 lb 15.6 oz (77.1 kg), SpO2 96 %.   Disposition: 01-Home or Self Care      Discharge Instructions    Amb Referral to Cardiac Rehabilitation    Complete by:  As directed      Diet - low sodium heart healthy    Complete by:  As directed      Increase activity slowly    Complete by:  As directed             Medication List    STOP taking these medications        esomeprazole 20 MG capsule  Commonly known as:  [place] these medications        aspirin 81 MG chewable tablet  Chew 1 tablet (81 mg total) by mouth daily.  Notes to Patient:  NEW MEDICINE     atorvastatin 80 MG tablet  Commonly known as:  LIPITOR  Take 1 tablet (80 mg total) by mouth daily at 6 PM.  Notes to Patient:  NEW MEDICINE     butalbital-acetaminophen-caffeine 50-325-40 MG per tablet  Commonly known as:  FIORICET, ESGIC  Take 1 tablet by mouth every 4 (four) hours as needed for headache.     clopidogrel 75 MG tablet  Commonly known as:  PLAVIX  Take 1 tablet (75 mg total) by  mouth daily with breakfast.  Notes to Patient:  NEW MEDICINE     losartan-hydrochlorothiazide 50-12.5 MG per tablet  Commonly known as:  HYZAAR  Take 1 tablet by mouth daily.     nitroGLYCERIN 0.4 MG SL tablet  Commonly known as:  NITROSTAT  Place 0.4 mg under the tongue every 5 (five) minutes as needed for chest pain.     ondansetron 4 MG tablet  Commonly known as:  ZOFRAN  Take 4 mg by mouth every 8 (eight) hours as needed for nausea or vomiting.     pantoprazole 40 MG tablet  Commonly known as:  PROTONIX  Take 1 tablet (40 mg total) by mouth daily.         Follow-up Information    Follow up with Ermalinda Barrios, PA-C On 04/14/2014.   Specialty:  Cardiology   Why:  12:15 pm (Dr. Kyla Balzarine PA)   Contact information:   [employer] [address] [name] [address] [phone]      [place], INCLUDING PHYSICIAN TIME: >30 MINUTES  Signed: Lyda Jester 04/04/2014, 12:27 PM

## 2014-04-04 NOTE — Care Management Note (Signed)
    Page 1 of 1   04/04/2014     11:41:16 AM CARE MANAGEMENT NOTE 04/04/2014  Patient:  Brandon [name], Brandon [name]   Account Number:  [id number]  Date Initiated:  04/04/2014  Documentation initiated by:  Mariann Laster  Subjective/Objective Assessment:   Angina, class III, CP, CAD     Action/Plan:   CM to follow for disposition needs   Anticipated DC Date:  04/04/2014   Anticipated DC Plan:  HOME/SELF CARE         Choice offered to / List presented to:             Status of service:  Completed, signed off Medicare Important Message given?   (If response is "NO", the following Medicare IM given date fields will be blank) Date Medicare IM given:   Medicare IM given by:   Date Additional Medicare IM given:   Additional Medicare IM given by:    Discharge Disposition:  HOME/SELF CARE  Per UR Regulation:  Reviewed for med. necessity/level of care/duration of stay  If discussed at [place] of Stay Meetings, dates discussed:    Comments:  Brandon Hodkinson RN, BSN, MSHL, CCM  Nurse - Case Manager,  (Unit [phone]  04/04/2014 Specialty Med Review:  Plavix - insured Dispo:  Home / Self care.

## 2014-04-04 NOTE — Progress Notes (Signed)
CARDIAC REHAB PHASE I   PRE:  Rate/Rhythm: 76 SR    BP: sitting 115/72    SaO2:   MODE:  Ambulation: 650 ft   POST:  Rate/Rhythm: 74 SR    BP: sitting 133/73     SaO2:   Tolerated well. No c/o. Ed completed. Will refer to [facility] in [place] (was in Maintenance). [phone]   Darrick Meigs CES, ACSM 04/04/2014 8:56 AM

## 2014-04-06 IMAGING — US US CAROTID DUPLEX BILAT
1 series · 13 of 24 positions shown · non-contrast
Comparison: None.

CLINICAL DATA: Right carotid bruit.  History of hypertension.

BILATERAL CAROTID DUPLEX ULTRASOUND
TECHNIQUE: Gray scale imaging, color Doppler and duplex ultrasound
was performed of bilateral carotid and vertebral arteries in the
neck.

[Series 1: us carotid duplex bilat · 0.05mm/px · 13 of 74 slices shown]
[im 1/74]
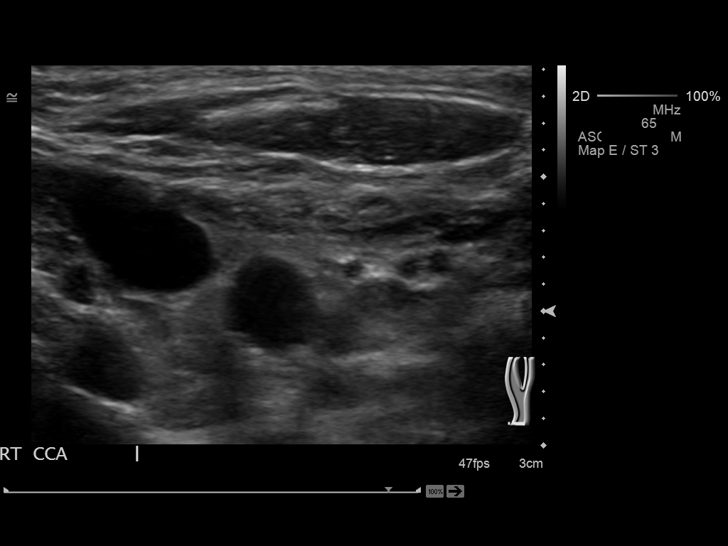
[im 7/74]
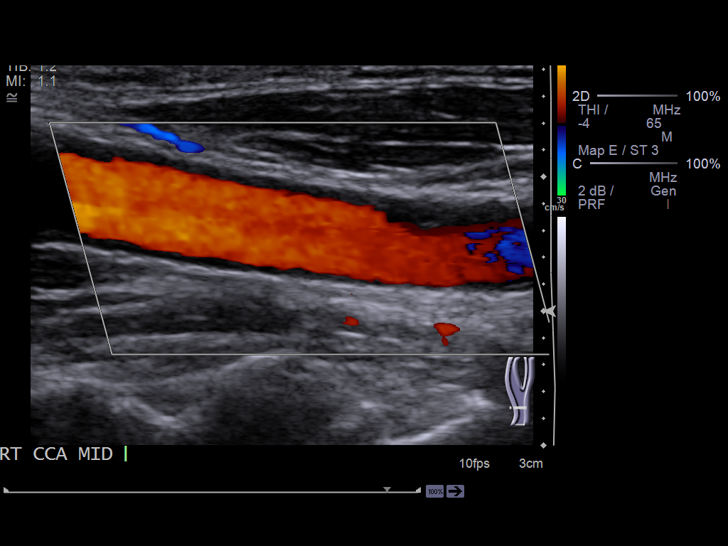
[im 13/74]
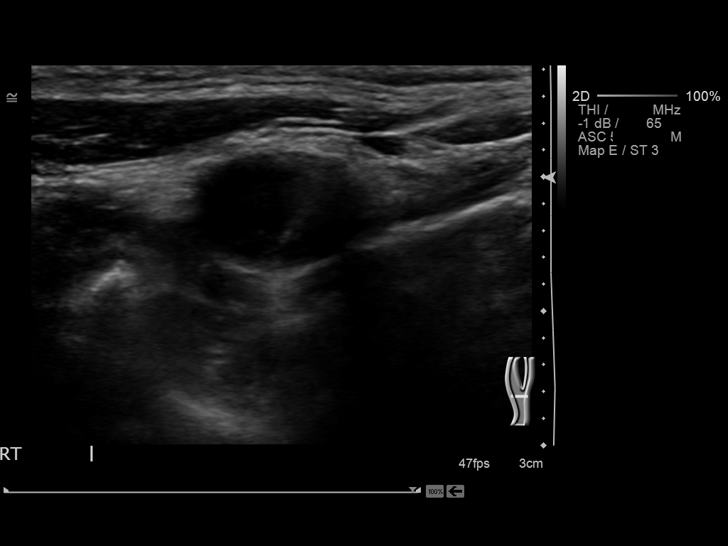
[im 20/74]
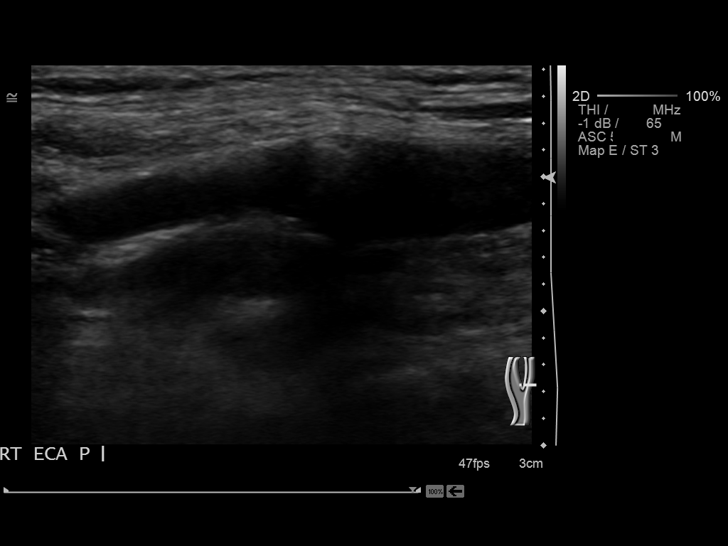
[im 26/74]
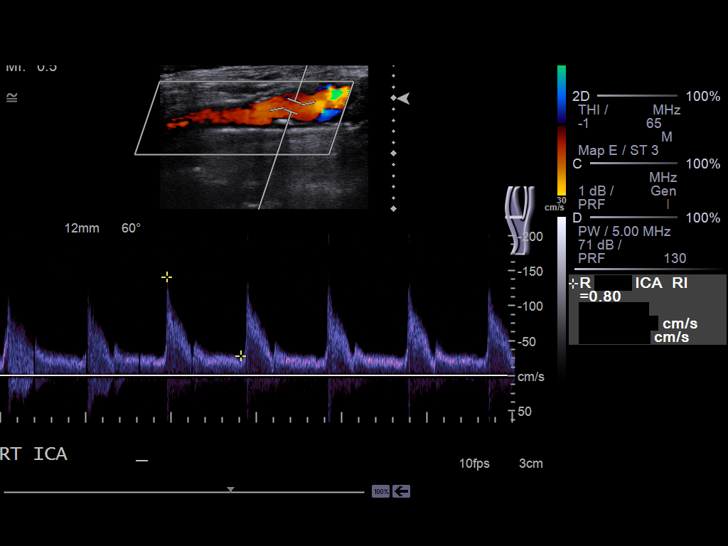
[im 32/74]
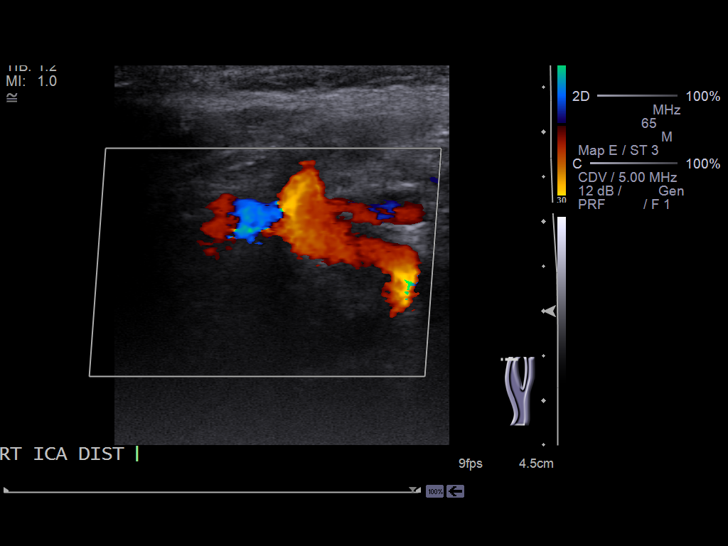
[im 39/74]
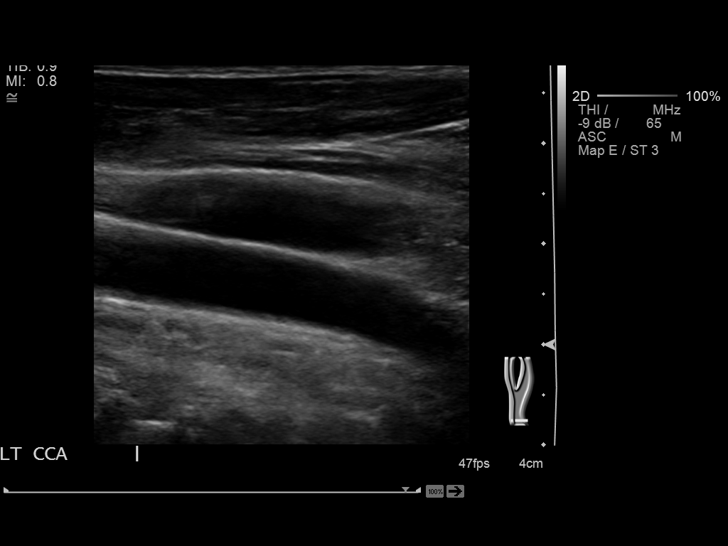
[im 42/74]
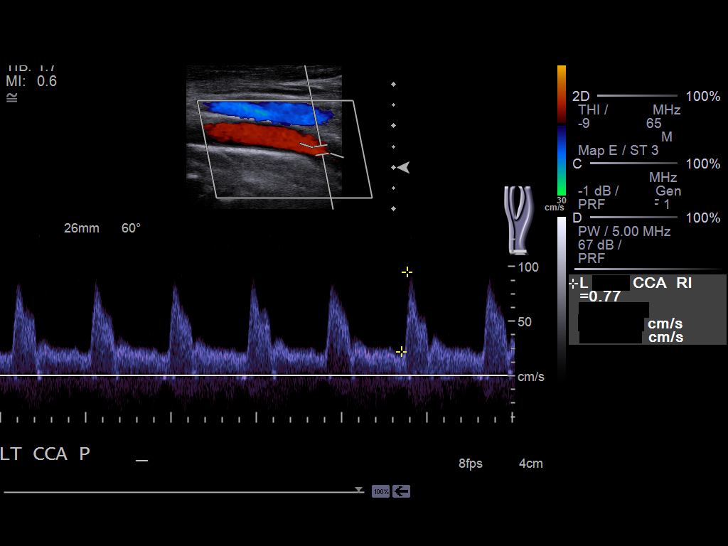
[im 48/74]
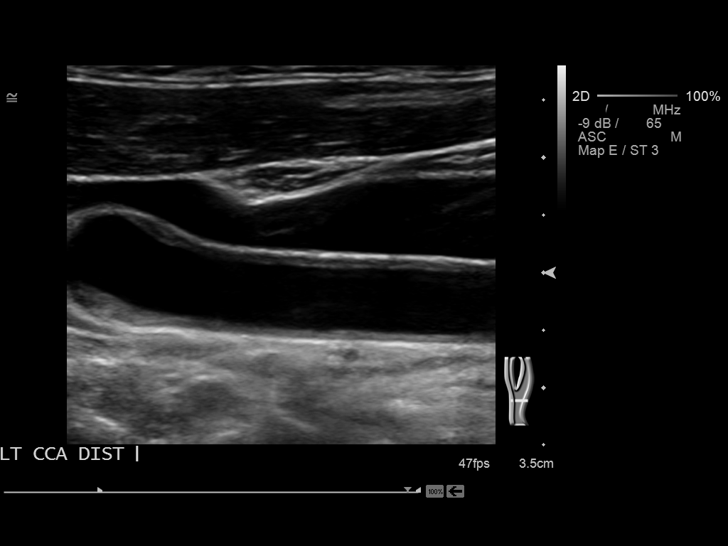
[im 54/74]
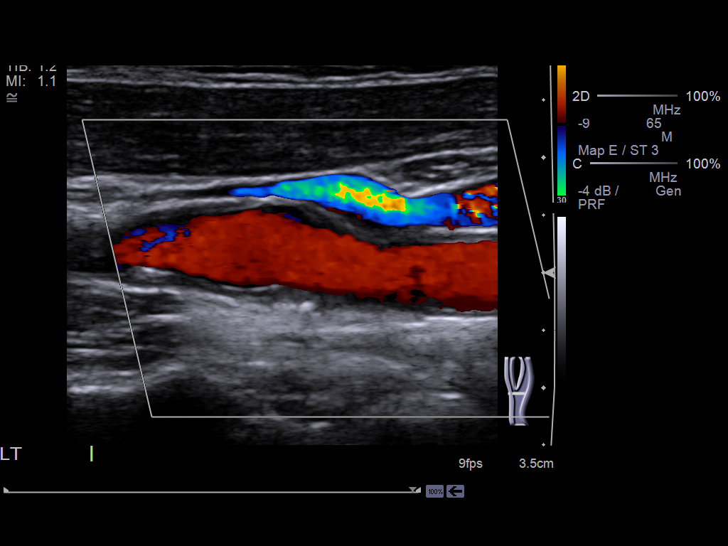
[im 61/74]
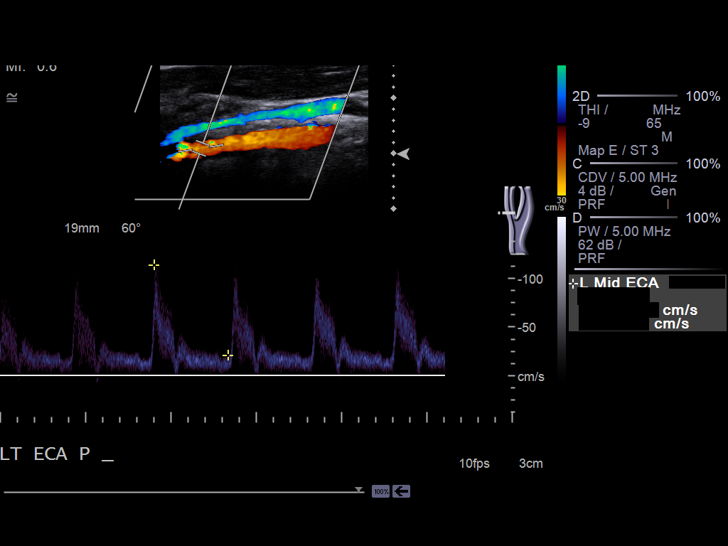
[im 67/74]
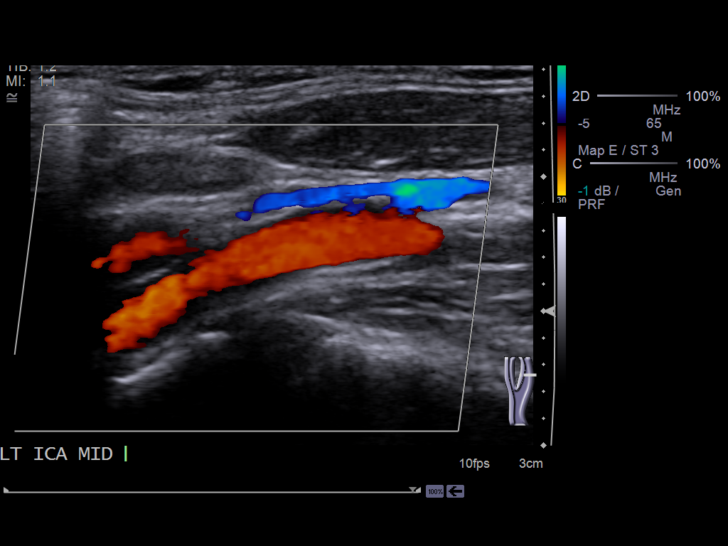
[im 74/74]
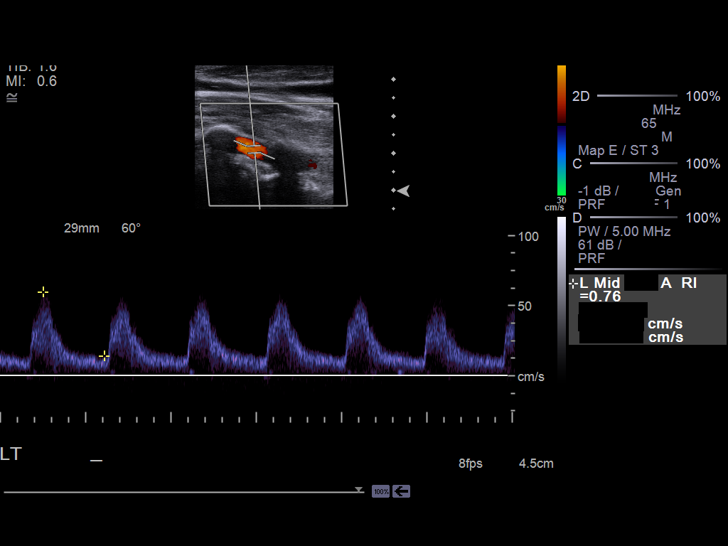

[13 of 24 positions shown; findings below may reference images not displayed]

Criteria:  Quantification of carotid stenosis is based on velocity
parameters that correlate the residual internal carotid diameter
with NASCET-based stenosis levels, using the diameter of the distal
internal carotid lumen as the denominator for stenosis measurement.

The following velocity measurements were obtained:

                 PEAK SYSTOLIC/END DIASTOLIC
RIGHT
ICA:                        141/28cm/sec
CCA:                        72/15cm/sec
SYSTOLIC ICA/CCA RATIO:
DIASTOLIC ICA/CCA RATIO:
ECA:                        220cm/sec

LEFT
ICA:                        85/29cm/sec
CCA:                        83/20cm/sec
SYSTOLIC ICA/CCA RATIO:
DIASTOLIC ICA/CCA RATIO:
ECA:                        114cm/sec
FINDINGS: RIGHT CAROTID ARTERY: Partially calcified plaque is present at the
level of the carotid bulb and ICA origin.  Based on velocities,
estimated proximal ICA stenosis is 50 - 69%.

RIGHT VERTEBRAL ARTERY:  Antegrade flow with normal wave form.

LEFT CAROTID ARTERY: The left bulb and proximal ICA show a mild to
moderate amount of mixed plaque.  Based on velocities, estimated
left ICA stenosis is less than 50%.

LEFT VERTEBRAL ARTERY:  Antegrade flow with normal wave form.
IMPRESSION: Moderate bilateral carotid atherosclerotic plaque.  Estimated right
ICA stenosis is 50 - 59%.  Estimated left ICA stenosis is less than
50%.

## 2014-04-08 ENCOUNTER — Encounter (HOSPITAL_COMMUNITY)
Admission: RE | Admit: 2014-04-08 | Discharge: 2014-04-08 | Disposition: A | Discharge status: Home / Self Care | Payer: Medicare Other | Source: Ambulatory Visit | Attending: Cardiovascular Disease | Admitting: Cardiovascular Disease

## 2014-04-08 ENCOUNTER — Telehealth: Payer: Self-pay | Admitting: Cardiovascular Disease

## 2014-04-08 ENCOUNTER — Encounter (HOSPITAL_COMMUNITY): Payer: Self-pay

## 2014-04-08 VITALS — BP 102/70 | HR 67 | Ht 68.0 in | Wt 171.6 lb

## 2014-04-08 DIAGNOSIS — Z955 Presence of coronary angioplasty implant and graft: Secondary | ICD-10-CM | POA: Insufficient documentation

## 2014-04-08 DIAGNOSIS — Z5189 Encounter for other specified aftercare: Secondary | ICD-10-CM | POA: Insufficient documentation

## 2014-04-08 DIAGNOSIS — Z9861 Coronary angioplasty status: Secondary | ICD-10-CM

## 2014-04-08 NOTE — Telephone Encounter (Signed)
New message    Patient calling recent had stent placement last week with Dr. Johnsie Cancel .medication was change, has some questions.

## 2014-04-08 NOTE — Patient Instructions (Signed)
Pt has finished orientation and is scheduled to start CR on 04/21/14 at 8:15. Pt has been instructed to arrive to class 15 minutes early for scheduled class. Pt has been instructed to wear comfortable clothing and shoes with rubber soles. Pt has been told to take their medications 1 hour prior to coming to class.  If the patient is not going to attend class, he/she has been instructed to call.

## 2014-04-08 NOTE — Progress Notes (Addendum)
Patient referred to Cardiac Rehab by Dr Johnsie Cancel due to Coronary Artery stents x 2 V45.82. Dr. Johnsie Cancel is his cardiologist and Dr. Luan Pulling is his PCP.  During orientation advised patient on arrival and appointment times what to wear, what to do before, during and after exercise.  Reviewed attendance and class policy.  Talked about inclement weather and class consultation policy. Patient is scheduled to start cardiac Rehab on April 21, 2014 at 0815.  Patient was advised to come to class 5 minutes before class starts.  He was also given instructions on meeting with the dietician and attending the Family Structure classes. Pt states he is coming to cardiac rehab due to his cardiologist highly suggesting it. Patient was able to complete 6 minute walk test.

## 2014-04-09 ENCOUNTER — Telehealth: Payer: Self-pay | Admitting: Cardiovascular Disease

## 2014-04-09 MED ORDER — PRASUGREL HCL 10 MG PO TABS: 10.0000 mg | ORAL_TABLET | Freq: Every day | ORAL | Status: DC

## 2014-04-09 MED ORDER — ESOMEPRAZOLE MAGNESIUM 20 MG PO CPDR: DELAYED_RELEASE_CAPSULE | ORAL | Status: DC

## 2014-04-09 NOTE — Telephone Encounter (Signed)
Pt had a cardiac cath on 04/03/14. On discharged from the hospital his Nexium medication was changed to Protonix 40 mg once a day. Pt is taking Plavix 75 mg daily. Pt states that this medication is not working he has indigestion, and he  has been eating  the tums tablets like candy. Pt would like to have prescribed a different medication that would work better.

## 2014-04-09 NOTE — Telephone Encounter (Signed)
New message     Will nexium and effient work together?

## 2014-04-09 NOTE — Telephone Encounter (Signed)
Pt is aware of Dr Kyla Balzarine recommendations. He would like to go back taking Nexium 20 mg once daily and change Plavix 75 mg to Effient 10 mg once a day. A prescription was send for Effient 10 mg once a day to [facility]. pt is aware.

## 2014-04-09 NOTE — Telephone Encounter (Signed)
Spoke with pt's wife she wanted to know if Effient and Nexium medications work together. wife is aware that Dr. Johnsie Cancel changed the Plavix to Effient, because he can take Nexium with Effient.Wife verbalized understanding. Pt's wife states that pt has gone back to work today. 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 Pt and wife are aware of post hospital visit on 04/14/14.

## 2014-04-09 NOTE — Telephone Encounter (Signed)
Cannot take nexium with plavix  If he wants to take nexium call him in Effient 10 mg daily to take the place of plavix

## 2014-04-09 NOTE — Telephone Encounter (Signed)
Follow up     Pt states he did not get a call back yesterday.  Please call asap

## 2014-04-14 ENCOUNTER — Encounter: Payer: Self-pay | Admitting: Physician Assistant

## 2014-04-14 ENCOUNTER — Ambulatory Visit (INDEPENDENT_AMBULATORY_CARE_PROVIDER_SITE_OTHER): Payer: Medicare Other | Admitting: Physician Assistant

## 2014-04-14 VITALS — BP 112/78 | HR 74 | Ht 68.0 in | Wt 172.0 lb

## 2014-04-14 DIAGNOSIS — E785 Hyperlipidemia, unspecified: Secondary | ICD-10-CM | POA: Diagnosis not present

## 2014-04-14 DIAGNOSIS — I10 Essential (primary) hypertension: Secondary | ICD-10-CM | POA: Diagnosis not present

## 2014-04-14 DIAGNOSIS — I2510 Atherosclerotic heart disease of native coronary artery without angina pectoris: Secondary | ICD-10-CM

## 2014-04-14 DIAGNOSIS — I6523 Occlusion and stenosis of bilateral carotid arteries: Secondary | ICD-10-CM | POA: Diagnosis not present

## 2014-04-14 DIAGNOSIS — Z9861 Coronary angioplasty status: Secondary | ICD-10-CM | POA: Diagnosis not present

## 2014-04-14 NOTE — Assessment & Plan Note (Signed)
Check fasting lipid panel and LFTs in 6 weeks. 

## 2014-04-14 NOTE — Assessment & Plan Note (Signed)
B/P controlled 

## 2014-04-14 NOTE — Assessment & Plan Note (Signed)
Doing well post DES LAD & OM1. No chest pain. 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 Follow up with Dr. Johnsie Cancel in 2 months.

## 2014-04-14 NOTE — Progress Notes (Signed)
Brandon [name] is a 68 year old male patient of Dr.Nishan admitted to the hospital with unstable angina. He underwent PTCA and stenting of the mid LAD and OM1. LV function was normal and showed mild inferior basal hypokinesis. He has a history of HTN, 50-60% RICA stenosis.  Patient comes in today without cardiac complaints. He's back to work without chest tightness, dyspnea, dyspnea on exertion or presyncope. 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 He is doing cardiac rehab. He does have more bruising and had blood on a tissue when he blew his nose. He denies any nose bleeds that didn't stop or black or bloody stools.    Allergies  Allergen Reactions  . Ciprofloxacin Nausea And Vomiting     Current Outpatient Prescriptions  Medication Sig Dispense Refill  . aspirin 81 MG chewable tablet Chew 1 tablet (81 mg total) by mouth daily. 30 tablet 5  . atorvastatin (LIPITOR) 80 MG tablet Take 1 tablet (80 mg total) by mouth daily at 6 PM. 30 tablet 5  . butalbital-acetaminophen-caffeine (FIORICET, ESGIC) 50-325-40 MG per tablet Take 1 tablet by mouth every 4 (four) hours as needed for headache.    . esomeprazole (NEXIUM) 20 MG capsule Take 1 caplsule by mouth before breakfast    . losartan-hydrochlorothiazide (HYZAAR) 50-12.5 MG per tablet Take 1 tablet by mouth daily.    . nitroGLYCERIN (NITROSTAT) 0.4 MG SL tablet Place 0.4 mg under the tongue every 5 (five) minutes as needed for chest pain.    Marland Kitchen ondansetron (ZOFRAN) 4 MG tablet Take 4 mg by mouth every 8 (eight) hours as needed for nausea or vomiting.    . prasugrel (EFFIENT) 10 MG TABS tablet Take 1 tablet (10 mg total) by mouth daily. 30 tablet 6   No current facility-administered medications for this visit.    Past Medical History  Diagnosis Date  . PONV (postoperative nausea and vomiting)   . Hypertension   . Shortness of breath     with exertion  . GERD (gastroesophageal reflux disease)   . Blood transfusion     no  trouble-51 years ago  . Arthritis   . Chronic prostatitis   . Diverticulitis   . Hematuria   . Hernia of other specified sites of abdominal cavity without mention of obstruction or gangrene   . Other specified forms of hearing loss   . Chronic headaches   . CAD S/P percutaneous coronary angioplasty: DES PCI to mLAD & OM1 04/03/2014    a. Lesion #1: (OM 1 90%) Promus Premier DES 2.25 x 12 mm (2.5 mm)  Lesion #2: (mid LAD 70% ) Promus Premier DES 2.5 x 16 mm  (2.75 mm)     Past Surgical History  Procedure Laterality Date  . Reconstruction mid-face      left face  . Fracture surgery      left arm-25 yrs ago  . Cholecystectomy      aph-15 yrs ago0-[facility]  . 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 Inguinal hernia repair  04/13/2011    Procedure: HERNIA REPAIR INGUINAL ADULT;  Surgeon: [name];  Location: [facility];  Service: General;  Laterality: Right;  . Coronary stent placement  04/03/2014    DES  OMI  &  LAD    No family history on file.  History   Social History  . Marital Status: Married    Spouse Name: N/A  Number of Children: N/A  . Years of Education: N/A   Occupational History  . Not on file.   Social History Main Topics  . Smoking status: Former Smoker -- 2.00 packs/day for 14 years    Types: Cigarettes    Quit date: 04/10/1969  . Smokeless tobacco: Never Used  . Alcohol Use: No  . Drug Use: No  . Sexual Activity: Not on file   Other Topics Concern  . Not on file   Social History Narrative    ROS: See HPI otherwise negative  BP 112/78 mmHg  Pulse 74  Ht 5\' 8"  (1.727 m)  Wt 172 lb (78.019 kg)  BMI 26.16 kg/m2  PHYSICAL EXAM: Well-nournished, in no acute distress. Neck: Right carotid bruit, No JVD, HJR,  or thyroid enlargement  Lungs: No tachypnea, clear without wheezing, rales, or rhonchi  Cardiovascular: RRR, PMI not displaced, Normal S1 and S2, positive S4,no murmurs, bruit, thrill, or heave.  Abdomen: BS  normal. Soft without organomegaly, masses, lesions or tenderness.  Extremities: Right groin without hematoma or hemmorhage at cath site, otherwise lower extremities without cyanosis, clubbing or edema. Good distal pulses bilateral  SKin: Warm, no lesions or rashes   Musculoskeletal: No deformities  Neuro: no focal signs   Wt Readings from Last 3 Encounters:  04/14/14 172 lb (78.019 kg)  04/08/14 171 lb 9.6 oz (77.837 kg)  04/03/14 169 lb 15.6 oz (77.1 kg)    Lab Results  Component Value Date   WBC 8.2 04/04/2014   HGB 13.7 04/04/2014   HCT 41.3 04/04/2014   PLT 191 04/04/2014   GLUCOSE 105* 04/04/2014   NA 139 04/04/2014   K 4.4 04/04/2014   CL 103 04/04/2014   CREATININE 1.16 04/04/2014   BUN 18 04/04/2014   CO2 22 04/04/2014   INR 1.0 03/31/2014    EKG:NSR, normal EKG    [facility] 04/03/14 Procedural Findings: Hemodynamics:  AO 115 61 LV 121 8              Coronary angiography: Coronary dominance: right  Left mainstem: Normal  Left anterior descending (LAD): 30% proximal and mid 70% distal after large D2              D1: small normal             D2: large 30% ostial stenosis  Left circumflex (LCx):  30% proximal              OM1:  70% proximal stenosis  Right coronary artery (RCA): 30% proximal mid and distal  5 large PDA/PLA branches normal and superdominant    Left ventriculography: Left ventricular systolic function is normal with mild inferobasal hypokinesis    ? Moderate grade 2/4 mitral regurgitation

## 2014-04-14 NOTE — Patient Instructions (Signed)
Your physician recommends that you continue on your current medications as directed. Please refer to the Current Medication list given to you today.     Your physician recommends that you return for lab work in:  [place] LFT    Your physician recommends that you schedule a follow-up appointment in:  WITH DR Bradley 2 MONTHS

## 2014-04-21 ENCOUNTER — Encounter (HOSPITAL_COMMUNITY)
Admission: RE | Admit: 2014-04-21 | Discharge: 2014-04-21 | Disposition: A | Discharge status: Home / Self Care | Payer: Medicare Other | Source: Ambulatory Visit | Attending: Cardiovascular Disease | Admitting: Cardiovascular Disease

## 2014-04-21 DIAGNOSIS — Z955 Presence of coronary angioplasty implant and graft: Secondary | ICD-10-CM | POA: Diagnosis not present

## 2014-04-21 DIAGNOSIS — Z5189 Encounter for other specified aftercare: Secondary | ICD-10-CM | POA: Diagnosis not present

## 2014-04-23 ENCOUNTER — Encounter (HOSPITAL_COMMUNITY)
Admission: RE | Admit: 2014-04-23 | Discharge: 2014-04-23 | Disposition: A | Discharge status: Home / Self Care | Payer: Medicare Other | Source: Ambulatory Visit | Attending: Cardiovascular Disease | Admitting: Cardiovascular Disease

## 2014-04-23 DIAGNOSIS — Z5189 Encounter for other specified aftercare: Secondary | ICD-10-CM | POA: Diagnosis not present

## 2014-04-25 ENCOUNTER — Encounter (HOSPITAL_COMMUNITY): Payer: Medicare Other

## 2014-04-28 ENCOUNTER — Encounter (HOSPITAL_COMMUNITY)
Admission: RE | Admit: 2014-04-28 | Discharge: 2014-04-28 | Disposition: A | Discharge status: Home / Self Care | Payer: Medicare Other | Source: Ambulatory Visit | Attending: Cardiovascular Disease | Admitting: Cardiovascular Disease

## 2014-04-28 DIAGNOSIS — Z5189 Encounter for other specified aftercare: Secondary | ICD-10-CM | POA: Diagnosis not present

## 2014-04-28 NOTE — Progress Notes (Signed)
Cardiac Rehabilitation Program Outcomes Report   Orientation:  04/08/14 Graduate Date:  TBD Discharge Date:  TBD # of sessions completed: 3  Cardiologist: Dr. Johnsie Cancel Family MD:  Dr. Karie Fetch Time:  5501  A.  Exercise Program:  Tolerates exercise @ 3.52 METS for 15 minutes  B.  Mental Health:  Good mental attitude  C.  Education/Instruction/Skills  Accurately checks own pulse.  Rest:  66  Exercise:  85 and Knows THR for exercise    D.  Nutrition/Weight Control/Body Composition:  Adherence to prescribed nutrition program: fair  and Weight loss not indicated   E.  Blood Lipids   No results found for: CHOL, HDL, LDLCALC, LDLDIRECT, TRIG, CHOLHDL  F.  Lifestyle Changes:  Making positive lifestyle changes and Not smoking:  Quit 1972  G.  Symptoms noted with exercise:  Musculoskeletal problems  Report Completed By:  Felicity Coyer, RN   Comments: Pt c/o chronic hip pain at times with exercise.  Staff support and encouragement provided.

## 2014-04-30 ENCOUNTER — Encounter (HOSPITAL_COMMUNITY)
Admission: RE | Admit: 2014-04-30 | Discharge: 2014-04-30 | Disposition: A | Discharge status: Home / Self Care | Payer: Medicare Other | Source: Ambulatory Visit | Attending: Cardiovascular Disease | Admitting: Cardiovascular Disease

## 2014-04-30 DIAGNOSIS — Z5189 Encounter for other specified aftercare: Secondary | ICD-10-CM | POA: Insufficient documentation

## 2014-04-30 DIAGNOSIS — Z955 Presence of coronary angioplasty implant and graft: Secondary | ICD-10-CM | POA: Diagnosis not present

## 2014-05-02 ENCOUNTER — Encounter (HOSPITAL_COMMUNITY)
Admission: RE | Admit: 2014-05-02 | Discharge: 2014-05-02 | Disposition: A | Discharge status: Home / Self Care | Payer: Medicare Other | Source: Ambulatory Visit | Attending: Cardiovascular Disease | Admitting: Cardiovascular Disease

## 2014-05-02 DIAGNOSIS — Z5189 Encounter for other specified aftercare: Secondary | ICD-10-CM | POA: Diagnosis not present

## 2014-05-05 ENCOUNTER — Encounter (HOSPITAL_COMMUNITY)
Admission: RE | Admit: 2014-05-05 | Discharge: 2014-05-05 | Disposition: A | Discharge status: Home / Self Care | Payer: Medicare Other | Source: Ambulatory Visit | Attending: Cardiovascular Disease | Admitting: Cardiovascular Disease

## 2014-05-05 DIAGNOSIS — Z5189 Encounter for other specified aftercare: Secondary | ICD-10-CM | POA: Diagnosis not present

## 2014-05-07 ENCOUNTER — Encounter (HOSPITAL_COMMUNITY)
Admission: RE | Admit: 2014-05-07 | Discharge: 2014-05-07 | Disposition: A | Discharge status: Home / Self Care | Payer: Medicare Other | Source: Ambulatory Visit | Attending: Cardiovascular Disease | Admitting: Cardiovascular Disease

## 2014-05-07 DIAGNOSIS — Z5189 Encounter for other specified aftercare: Secondary | ICD-10-CM | POA: Diagnosis not present

## 2014-05-08 ENCOUNTER — Encounter (HOSPITAL_COMMUNITY): Payer: Self-pay | Admitting: Cardiovascular Disease

## 2014-05-09 ENCOUNTER — Encounter (HOSPITAL_COMMUNITY)
Admission: RE | Admit: 2014-05-09 | Discharge: 2014-05-09 | Disposition: A | Discharge status: Home / Self Care | Payer: Medicare Other | Source: Ambulatory Visit | Attending: Cardiovascular Disease | Admitting: Cardiovascular Disease

## 2014-05-09 DIAGNOSIS — Z5189 Encounter for other specified aftercare: Secondary | ICD-10-CM | POA: Diagnosis not present

## 2014-05-12 ENCOUNTER — Encounter (HOSPITAL_COMMUNITY)
Admission: RE | Admit: 2014-05-12 | Discharge: 2014-05-12 | Disposition: A | Discharge status: Home / Self Care | Payer: Medicare Other | Source: Ambulatory Visit | Attending: Cardiovascular Disease | Admitting: Cardiovascular Disease

## 2014-05-12 DIAGNOSIS — Z5189 Encounter for other specified aftercare: Secondary | ICD-10-CM | POA: Diagnosis not present

## 2014-05-14 ENCOUNTER — Encounter (HOSPITAL_COMMUNITY)
Admission: RE | Admit: 2014-05-14 | Discharge: 2014-05-14 | Disposition: A | Discharge status: Home / Self Care | Payer: Medicare Other | Source: Ambulatory Visit | Attending: Cardiovascular Disease | Admitting: Cardiovascular Disease

## 2014-05-14 DIAGNOSIS — Z5189 Encounter for other specified aftercare: Secondary | ICD-10-CM | POA: Diagnosis not present

## 2014-05-16 ENCOUNTER — Encounter (HOSPITAL_COMMUNITY)
Admission: RE | Admit: 2014-05-16 | Discharge: 2014-05-16 | Disposition: A | Discharge status: Home / Self Care | Payer: Medicare Other | Source: Ambulatory Visit | Attending: Cardiovascular Disease | Admitting: Cardiovascular Disease

## 2014-05-16 DIAGNOSIS — Z5189 Encounter for other specified aftercare: Secondary | ICD-10-CM | POA: Diagnosis not present

## 2014-05-19 ENCOUNTER — Encounter (HOSPITAL_COMMUNITY)
Admission: RE | Admit: 2014-05-19 | Discharge: 2014-05-19 | Disposition: A | Discharge status: Home / Self Care | Payer: Medicare Other | Source: Ambulatory Visit | Attending: Cardiovascular Disease | Admitting: Cardiovascular Disease

## 2014-05-19 DIAGNOSIS — Z5189 Encounter for other specified aftercare: Secondary | ICD-10-CM | POA: Diagnosis not present

## 2014-05-21 ENCOUNTER — Encounter (HOSPITAL_COMMUNITY)
Admission: RE | Admit: 2014-05-21 | Discharge: 2014-05-21 | Disposition: A | Discharge status: Home / Self Care | Payer: Medicare Other | Source: Ambulatory Visit | Attending: Cardiovascular Disease | Admitting: Cardiovascular Disease

## 2014-05-21 DIAGNOSIS — Z5189 Encounter for other specified aftercare: Secondary | ICD-10-CM | POA: Diagnosis not present

## 2014-05-23 ENCOUNTER — Encounter (HOSPITAL_COMMUNITY): Payer: Medicare Other

## 2014-05-26 ENCOUNTER — Other Ambulatory Visit: Payer: Medicare Other

## 2014-05-26 ENCOUNTER — Encounter (HOSPITAL_COMMUNITY)
Admission: RE | Admit: 2014-05-26 | Discharge: 2014-05-26 | Disposition: A | Discharge status: Home / Self Care | Payer: Medicare Other | Source: Ambulatory Visit | Attending: Cardiovascular Disease | Admitting: Cardiovascular Disease

## 2014-05-26 DIAGNOSIS — Z5189 Encounter for other specified aftercare: Secondary | ICD-10-CM | POA: Diagnosis not present

## 2014-05-27 ENCOUNTER — Other Ambulatory Visit (INDEPENDENT_AMBULATORY_CARE_PROVIDER_SITE_OTHER): Payer: Medicare Other | Admitting: *Deleted

## 2014-05-27 DIAGNOSIS — I10 Essential (primary) hypertension: Secondary | ICD-10-CM | POA: Diagnosis not present

## 2014-05-27 LAB — LIPID PANEL
Cholesterol: 118 mg/dL (ref 0–200)
HDL: 33.6 mg/dL — ABNORMAL LOW (ref >39.00)
LDL Cholesterol: 66 mg/dL (ref 0–99)
NONHDL: 84.4
Total CHOL/HDL Ratio: 4
Triglycerides: 92 mg/dL (ref 0.0–149.0)
VLDL: 18.4 mg/dL (ref 0.0–40.0)

## 2014-05-27 LAB — HEPATIC FUNCTION PANEL
ALBUMIN: 4 g/dL (ref 3.5–5.2)
ALT: 27 U/L (ref 0–53)
AST: 22 U/L (ref 0–37)
Alkaline Phosphatase: 74 U/L (ref 39–117)
Bilirubin, Direct: 0 mg/dL (ref 0.0–0.3)
TOTAL PROTEIN: 7.6 g/dL (ref 6.0–8.3)
Total Bilirubin: 0.6 mg/dL (ref 0.2–1.2)

## 2014-05-28 ENCOUNTER — Encounter (HOSPITAL_COMMUNITY)
Admission: RE | Admit: 2014-05-28 | Discharge: 2014-05-28 | Disposition: A | Discharge status: Home / Self Care | Payer: Medicare Other | Source: Ambulatory Visit | Attending: Cardiovascular Disease | Admitting: Cardiovascular Disease

## 2014-05-28 DIAGNOSIS — Z5189 Encounter for other specified aftercare: Secondary | ICD-10-CM | POA: Diagnosis not present

## 2014-05-30 ENCOUNTER — Encounter (HOSPITAL_COMMUNITY): Payer: Medicare Other

## 2014-06-02 ENCOUNTER — Encounter (HOSPITAL_COMMUNITY)
Admission: RE | Admit: 2014-06-02 | Discharge: 2014-06-02 | Disposition: A | Discharge status: Home / Self Care | Payer: Medicare Other | Source: Ambulatory Visit | Attending: Cardiovascular Disease | Admitting: Cardiovascular Disease

## 2014-06-02 DIAGNOSIS — Z5189 Encounter for other specified aftercare: Secondary | ICD-10-CM | POA: Diagnosis not present

## 2014-06-02 DIAGNOSIS — Z955 Presence of coronary angioplasty implant and graft: Secondary | ICD-10-CM | POA: Insufficient documentation

## 2014-06-04 ENCOUNTER — Encounter (HOSPITAL_COMMUNITY)
Admission: RE | Admit: 2014-06-04 | Discharge: 2014-06-04 | Disposition: A | Discharge status: Home / Self Care | Payer: Medicare Other | Source: Ambulatory Visit | Attending: Cardiovascular Disease | Admitting: Cardiovascular Disease

## 2014-06-04 DIAGNOSIS — Z5189 Encounter for other specified aftercare: Secondary | ICD-10-CM | POA: Diagnosis not present

## 2014-06-04 DIAGNOSIS — Z955 Presence of coronary angioplasty implant and graft: Secondary | ICD-10-CM | POA: Diagnosis not present

## 2014-06-06 ENCOUNTER — Encounter (HOSPITAL_COMMUNITY)
Admission: RE | Admit: 2014-06-06 | Discharge: 2014-06-06 | Disposition: A | Discharge status: Home / Self Care | Payer: Medicare Other | Source: Ambulatory Visit | Attending: Cardiovascular Disease | Admitting: Cardiovascular Disease

## 2014-06-06 DIAGNOSIS — Z955 Presence of coronary angioplasty implant and graft: Secondary | ICD-10-CM | POA: Diagnosis not present

## 2014-06-06 DIAGNOSIS — Z5189 Encounter for other specified aftercare: Secondary | ICD-10-CM | POA: Diagnosis not present

## 2014-06-06 NOTE — Progress Notes (Signed)
Cardiac Rehabilitation Program Outcomes Report   Orientation:  04/08/14 Graduate Date:  tbd Discharge Date:  tbd # of sessions completed: 18  Cardiologist: Nicholes Rough MD:  Karie Fetch Time:  0815  A.  Exercise Program:  Tolerates exercise @ 3.52 METS for 30 minutes  B.  Mental Health:  Good mental attitude  C.  Education/Instruction/Skills  Accurately checks own pulse.  Rest:  69  Exercise:  85, Knows THR for exercise and Uses Perceived Exertion Scale and/or Dyspnea Scale   D.  Nutrition/Weight Control/Body Composition:  Adherence to prescribed nutrition program: good    E.  Blood Lipids    Lab Results  Component Value Date   CHOL 118 05/27/2014   HDL 33.60* 05/27/2014   LDLCALC 66 05/27/2014   TRIG 92.0 05/27/2014   CHOLHDL 4 05/27/2014    F.  Lifestyle Changes:  Making positive lifestyle changes  G.  Symptoms noted with exercise:  Asymptomatic  Report Completed By:  Felicity Coyer, RN   Comments:  Halfway note.  Pt complains of occasional chronic right hip pain exacerbated by walking on treadmill.  Pt able to complete sessions on Treadmill without any difficulty.  No other complaints.

## 2014-06-09 ENCOUNTER — Encounter (HOSPITAL_COMMUNITY)
Admission: RE | Admit: 2014-06-09 | Discharge: 2014-06-09 | Disposition: A | Discharge status: Home / Self Care | Payer: Medicare Other | Source: Ambulatory Visit | Attending: Cardiovascular Disease | Admitting: Cardiovascular Disease

## 2014-06-09 DIAGNOSIS — Z5189 Encounter for other specified aftercare: Secondary | ICD-10-CM | POA: Diagnosis not present

## 2014-06-09 DIAGNOSIS — Z955 Presence of coronary angioplasty implant and graft: Secondary | ICD-10-CM | POA: Diagnosis not present

## 2014-06-10 ENCOUNTER — Telehealth: Payer: Self-pay | Admitting: Cardiovascular Disease

## 2014-06-10 NOTE — Telephone Encounter (Signed)
New message      Pt is returning a nurses call

## 2014-06-10 NOTE — Telephone Encounter (Signed)
REVIEWED CHART  NO MESSAGES  LEFT  RECENTLY  MAY BE   OLD  MESSAGE   LAST  LEFT MESSAGE WAS  FOR  SOME  LABS  FROM 12-31./CY

## 2014-06-11 ENCOUNTER — Encounter (HOSPITAL_COMMUNITY)
Admission: RE | Admit: 2014-06-11 | Discharge: 2014-06-11 | Disposition: A | Discharge status: Home / Self Care | Payer: Medicare Other | Source: Ambulatory Visit | Attending: Cardiovascular Disease | Admitting: Cardiovascular Disease

## 2014-06-13 ENCOUNTER — Encounter (HOSPITAL_COMMUNITY): Payer: Medicare Other

## 2014-06-13 DIAGNOSIS — H43812 Vitreous degeneration, left eye: Secondary | ICD-10-CM | POA: Diagnosis not present

## 2014-06-13 NOTE — Progress Notes (Signed)
Patient in Cardiac Rehab today and was complaining of headache and dizziness. Patient stated this is normal for him time to time. Upon further investigation, patient stated he was seeing black spots, bubbles and flashes of lights. Patient was strongly encouraged to see his eye doctor or go to the ER. Patient stated he was not going to the ER.  I called eye doctor and patient refused to drive to [facility] today. Local eye doctor called and worked him in. Patient left to go straight to [facility] [place]. Patient had ambulatory gait and neurological check was negative while in CR.

## 2014-06-16 ENCOUNTER — Encounter (HOSPITAL_COMMUNITY)
Admission: RE | Admit: 2014-06-16 | Discharge: 2014-06-16 | Disposition: A | Discharge status: Home / Self Care | Payer: Medicare Other | Source: Ambulatory Visit | Attending: Cardiovascular Disease | Admitting: Cardiovascular Disease

## 2014-06-16 DIAGNOSIS — Z5189 Encounter for other specified aftercare: Secondary | ICD-10-CM | POA: Diagnosis not present

## 2014-06-16 DIAGNOSIS — Z955 Presence of coronary angioplasty implant and graft: Secondary | ICD-10-CM | POA: Diagnosis not present

## 2014-06-17 DIAGNOSIS — H43812 Vitreous degeneration, left eye: Secondary | ICD-10-CM | POA: Diagnosis not present

## 2014-06-18 ENCOUNTER — Encounter (HOSPITAL_COMMUNITY)
Admission: RE | Admit: 2014-06-18 | Discharge: 2014-06-18 | Disposition: A | Discharge status: Home / Self Care | Payer: Medicare Other | Source: Ambulatory Visit | Attending: Cardiovascular Disease | Admitting: Cardiovascular Disease

## 2014-06-18 DIAGNOSIS — Z955 Presence of coronary angioplasty implant and graft: Secondary | ICD-10-CM | POA: Diagnosis not present

## 2014-06-18 DIAGNOSIS — Z5189 Encounter for other specified aftercare: Secondary | ICD-10-CM | POA: Diagnosis not present

## 2014-06-18 NOTE — Progress Notes (Signed)
Patient ID: Brandon [name], male   DOB: September 15, 1945, 69 y.o.   MRN: [id number]             [id number] is a 69 y.o.  admitted to the hospital  04/03/14 with unstable angina. He underwent PTCA and stenting of the mid LAD and OM1. LV function was normal and showed mild inferior basal hypokinesis. He has a history of HTN, 50-60% RICA stenosis.  [place] 04/03/14 Procedural Findings: Hemodynamics:  AO 115 61 LV 121 8              Coronary angiography: Coronary dominance: right  Left mainstem: Normal  Left anterior descending (LAD): 30% proximal and mid 70% distal after large D2              D1: small normal             D2: large 30% ostial stenosis  Left circumflex (LCx):  30% proximal              OM1:  70% proximal stenosis  Right coronary artery (RCA): 30% proximal mid and distal  5 large PDA/PLA branches normal and superdominant    Left ventriculography: Left ventricular systolic function is normal with mild inferobasal hypokinesis    ? Moderate grade 2/4 mitral regurgitation   Doppler 10/15 with 60-79% RICA stenosis Reviewed  LE dopplers done same time normal with no PVD   Patient comes in today without cardiac complaints. He's back to work without chest tightness, dyspnea, dyspnea on exertion or presyncope. 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 He is doing cardiac rehab. He does have more bruising and had blood on a tissue when he blew his nose. He denies any nose bleeds that didn't stop or black or bloody stools. Not taking ASA just Effient  Dizzy and postural after hyzaar     Allergies  Allergen Reactions  . Ciprofloxacin Nausea And Vomiting     Current Outpatient Prescriptions  Medication Sig Dispense Refill  . aspirin 81 MG chewable tablet Chew 1 tablet (81 mg total) by mouth daily. 30 tablet 5  . atorvastatin (LIPITOR) 80 MG tablet Take 1 tablet (80 mg total) by mouth daily at 6 PM. 30 tablet 5  . butalbital-acetaminophen-caffeine (FIORICET, ESGIC) 50-325-40 MG  per tablet Take 1 tablet by mouth every 4 (four) hours as needed for headache.    . esomeprazole (NEXIUM) 20 MG capsule Take 1 caplsule by mouth before breakfast    . losartan-hydrochlorothiazide (HYZAAR) 50-12.5 MG per tablet Take 1 tablet by mouth daily.    . nitroGLYCERIN (NITROSTAT) 0.4 MG SL tablet Place 0.4 mg under the tongue every 5 (five) minutes as needed for chest pain.    Marland Kitchen ondansetron (ZOFRAN) 4 MG tablet Take 4 mg by mouth every 8 (eight) hours as needed for nausea or vomiting.    . prasugrel (EFFIENT) 10 MG TABS tablet Take 1 tablet (10 mg total) by mouth daily. 30 tablet 6   No current facility-administered medications for this visit.    Past Medical History  Diagnosis Date  . PONV (postoperative nausea and vomiting)   . Hypertension   . Shortness of breath     with exertion  . GERD (gastroesophageal reflux disease)   . Blood transfusion     no trouble-51 years ago  . Arthritis   . Chronic prostatitis   . Diverticulitis   . Hematuria   . Hernia of other specified sites of abdominal cavity without mention of  obstruction or gangrene   . Other specified forms of hearing loss   . Chronic headaches   . CAD S/P percutaneous coronary angioplasty: DES PCI to mLAD & OM1 04/03/2014    a. Lesion #1: (OM 1 90%) Promus Premier DES 2.25 x 12 mm (2.5 mm)  Lesion #2: (mid LAD 70% ) Promus Premier DES 2.5 x 16 mm  (2.75 mm)     Past Surgical History  Procedure Laterality Date  . Reconstruction mid-face      left face  . Fracture surgery      left arm-25 yrs ago  . Cholecystectomy      aph-15 yrs ago0-[facility]  . 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 Inguinal hernia repair  04/13/2011    Procedure: HERNIA REPAIR INGUINAL ADULT;  Surgeon: [name];  Location: [facility];  Service: General;  Laterality: Right;  . Coronary stent placement  04/03/2014    DES  OMI  &  LAD  . Left heart catheterization with coronary angiogram N/A 04/03/2014    Procedure:  LEFT HEART CATHETERIZATION WITH CORONARY ANGIOGRAM;  Surgeon: [name], MD;  Location: [facility] CATH LAB;  Service: Cardiovascular;  Laterality: N/A;  . Percutaneous coronary stent intervention (pci-s)  04/03/2014    Procedure: PERCUTANEOUS CORONARY STENT INTERVENTION (PCI-S);  Surgeon: [name], MD;  Location: [facility] CATH LAB;  Service: Cardiovascular;;    No family history on file.  History   Social History  . Marital Status: Married    Spouse Name: N/A    Number of Children: N/A  . Years of Education: N/A   Occupational History  . Not on file.   Social History Main Topics  . Smoking status: Former Smoker -- 2.00 packs/day for 14 years    Types: Cigarettes    Quit date: 04/10/1969  . Smokeless tobacco: Never Used  . Alcohol Use: No  . Drug Use: No  . Sexual Activity: Not on file   Other Topics Concern  . Not on file   Social History Narrative    ROS: See HPI otherwise negative  There were no vitals taken for this visit.  PHYSICAL EXAM: Well-nournished, in no acute distress. Neck: Right carotid bruit, No JVD, HJR,  or thyroid enlargement  Lungs: No tachypnea, clear without wheezing, rales, or rhonchi  Cardiovascular: RRR, PMI not displaced, Normal S1 and S2, positive S4,no murmurs, bruit, thrill, or heave. Right carotid bruit   Abdomen: BS normal. Soft without organomegaly, masses, lesions or tenderness.  Extremities: Right groin without hematoma or hemmorhage at cath site, otherwise lower extremities without cyanosis, clubbing or edema. Good distal pulses bilateral  SKin: Warm, no lesions or rashes   Musculoskeletal: No deformities  Neuro: no focal signs   Wt Readings from Last 3 Encounters:  04/14/14 78.019 kg (172 lb)  03/04/14 77.111 kg (170 lb)  11/01/13 79.742 kg (175 lb 12.8 oz)    Lab Results  Component Value Date   WBC 8.2 04/04/2014   HGB 13.7 04/04/2014   HCT 41.3 04/04/2014   PLT 191 04/04/2014   GLUCOSE 105* 04/04/2014     CHOL 118 05/27/2014   TRIG 92.0 05/27/2014   HDL 33.60* 05/27/2014   LDLCALC 66 05/27/2014   ALT 27 05/27/2014   AST 22 05/27/2014   NA 139 04/04/2014   K 4.4 04/04/2014   CL 103 04/04/2014   CREATININE 1.16 04/04/2014   BUN 18 04/04/2014  CO2 22 04/04/2014   INR 1.0 03/31/2014    EKG:NSR, normal EKG

## 2014-06-19 ENCOUNTER — Ambulatory Visit (INDEPENDENT_AMBULATORY_CARE_PROVIDER_SITE_OTHER): Payer: Medicare Other | Admitting: Cardiovascular Disease

## 2014-06-19 ENCOUNTER — Encounter: Payer: Self-pay | Admitting: Cardiovascular Disease

## 2014-06-19 VITALS — BP 108/76 | HR 65 | Ht 68.0 in | Wt 176.0 lb

## 2014-06-19 DIAGNOSIS — Z9861 Coronary angioplasty status: Secondary | ICD-10-CM | POA: Diagnosis not present

## 2014-06-19 DIAGNOSIS — I10 Essential (primary) hypertension: Secondary | ICD-10-CM | POA: Diagnosis not present

## 2014-06-19 DIAGNOSIS — R0989 Other specified symptoms and signs involving the circulatory and respiratory systems: Secondary | ICD-10-CM

## 2014-06-19 DIAGNOSIS — I2510 Atherosclerotic heart disease of native coronary artery without angina pectoris: Secondary | ICD-10-CM | POA: Diagnosis not present

## 2014-06-19 MED ORDER — LOSARTAN POTASSIUM 25 MG PO TABS: 25.0000 mg | ORAL_TABLET | Freq: Every day | ORAL | Status: DC

## 2014-06-19 NOTE — Assessment & Plan Note (Signed)
F/U duplex 6 months

## 2014-06-19 NOTE — Patient Instructions (Signed)
Your physician has recommended you make the following change in your medication:  1) STOP Hyzaar  2) START Cozaar 25 mg daily  Your physician wants you to follow-up in: 6 months with Dr. Johnsie Cancel. You will receive a reminder letter in the mail two months in advance. If you don't receive a letter, please call our office to schedule the follow-up appointment.  Your physician has requested that you have a carotid duplex THE SAME DAY AS YOUR APPOINTMENT with Dr. Johnsie Cancel in 6 months. This test is an ultrasound of the carotid arteries in your neck. It looks at blood flow through these arteries that supply the brain with blood. Allow one hour for this exam. There are no restrictions or special instructions.

## 2014-06-19 NOTE — Assessment & Plan Note (Signed)
Stable with no angina and good activity level.  Continue medical Rx Has nitro on him  Effient until 11/16

## 2014-06-19 NOTE — Assessment & Plan Note (Signed)
Cholesterol is at goal.  Continue current dose of statin and diet Rx.  No myalgias or side effects.  F/U  LFT's in 6 months. Lab Results  Component Value Date   LDLCALC 66 05/27/2014

## 2014-06-19 NOTE — Assessment & Plan Note (Signed)
Decrease hyzaar to just 25 mg cozaar and follow

## 2014-06-20 ENCOUNTER — Encounter (HOSPITAL_COMMUNITY): Payer: Medicare Other

## 2014-06-23 ENCOUNTER — Encounter (HOSPITAL_COMMUNITY)
Admission: RE | Admit: 2014-06-23 | Discharge: 2014-06-23 | Disposition: A | Discharge status: Home / Self Care | Payer: Medicare Other | Source: Ambulatory Visit | Attending: Cardiovascular Disease | Admitting: Cardiovascular Disease

## 2014-06-23 DIAGNOSIS — Z955 Presence of coronary angioplasty implant and graft: Secondary | ICD-10-CM | POA: Diagnosis not present

## 2014-06-23 DIAGNOSIS — Z5189 Encounter for other specified aftercare: Secondary | ICD-10-CM | POA: Diagnosis not present

## 2014-06-25 ENCOUNTER — Encounter (HOSPITAL_COMMUNITY): Payer: Medicare Other

## 2014-06-27 ENCOUNTER — Encounter (HOSPITAL_COMMUNITY)
Admission: RE | Admit: 2014-06-27 | Discharge: 2014-06-27 | Disposition: A | Discharge status: Home / Self Care | Payer: Medicare Other | Source: Ambulatory Visit | Attending: Cardiovascular Disease | Admitting: Cardiovascular Disease

## 2014-06-27 DIAGNOSIS — Z5189 Encounter for other specified aftercare: Secondary | ICD-10-CM | POA: Diagnosis not present

## 2014-06-27 DIAGNOSIS — Z955 Presence of coronary angioplasty implant and graft: Secondary | ICD-10-CM | POA: Diagnosis not present

## 2014-06-30 ENCOUNTER — Encounter (HOSPITAL_COMMUNITY)
Admission: RE | Admit: 2014-06-30 | Discharge: 2014-06-30 | Disposition: A | Discharge status: Home / Self Care | Payer: Medicare Other | Source: Ambulatory Visit | Attending: Cardiovascular Disease | Admitting: Cardiovascular Disease

## 2014-06-30 DIAGNOSIS — Z955 Presence of coronary angioplasty implant and graft: Secondary | ICD-10-CM | POA: Insufficient documentation

## 2014-06-30 DIAGNOSIS — Z5189 Encounter for other specified aftercare: Secondary | ICD-10-CM | POA: Diagnosis not present

## 2014-07-02 ENCOUNTER — Encounter (HOSPITAL_COMMUNITY)
Admission: RE | Admit: 2014-07-02 | Discharge: 2014-07-02 | Disposition: A | Discharge status: Home / Self Care | Payer: Medicare Other | Source: Ambulatory Visit | Attending: Cardiovascular Disease | Admitting: Cardiovascular Disease

## 2014-07-02 DIAGNOSIS — Z5189 Encounter for other specified aftercare: Secondary | ICD-10-CM | POA: Diagnosis not present

## 2014-07-02 DIAGNOSIS — Z955 Presence of coronary angioplasty implant and graft: Secondary | ICD-10-CM | POA: Diagnosis not present

## 2014-07-04 ENCOUNTER — Encounter (HOSPITAL_COMMUNITY)
Admission: RE | Admit: 2014-07-04 | Discharge: 2014-07-04 | Disposition: A | Discharge status: Home / Self Care | Payer: Medicare Other | Source: Ambulatory Visit | Attending: Cardiovascular Disease | Admitting: Cardiovascular Disease

## 2014-07-04 DIAGNOSIS — Z955 Presence of coronary angioplasty implant and graft: Secondary | ICD-10-CM | POA: Diagnosis not present

## 2014-07-04 DIAGNOSIS — Z5189 Encounter for other specified aftercare: Secondary | ICD-10-CM | POA: Diagnosis not present

## 2014-07-07 ENCOUNTER — Encounter (HOSPITAL_COMMUNITY)
Admission: RE | Admit: 2014-07-07 | Discharge: 2014-07-07 | Disposition: A | Discharge status: Home / Self Care | Payer: Medicare Other | Source: Ambulatory Visit | Attending: Cardiovascular Disease | Admitting: Cardiovascular Disease

## 2014-07-07 DIAGNOSIS — Z955 Presence of coronary angioplasty implant and graft: Secondary | ICD-10-CM | POA: Diagnosis not present

## 2014-07-07 DIAGNOSIS — Z5189 Encounter for other specified aftercare: Secondary | ICD-10-CM | POA: Diagnosis not present

## 2014-07-09 ENCOUNTER — Encounter (HOSPITAL_COMMUNITY)
Admission: RE | Admit: 2014-07-09 | Discharge: 2014-07-09 | Disposition: A | Discharge status: Home / Self Care | Payer: Medicare Other | Source: Ambulatory Visit | Attending: Cardiovascular Disease | Admitting: Cardiovascular Disease

## 2014-07-09 DIAGNOSIS — Z5189 Encounter for other specified aftercare: Secondary | ICD-10-CM | POA: Diagnosis not present

## 2014-07-09 DIAGNOSIS — Z955 Presence of coronary angioplasty implant and graft: Secondary | ICD-10-CM | POA: Diagnosis not present

## 2014-07-11 ENCOUNTER — Encounter (HOSPITAL_COMMUNITY): Payer: Medicare Other

## 2014-07-14 ENCOUNTER — Encounter (HOSPITAL_COMMUNITY)
Admission: RE | Admit: 2014-07-14 | Discharge: 2014-07-14 | Disposition: A | Discharge status: Home / Self Care | Payer: Medicare Other | Source: Ambulatory Visit | Attending: Cardiovascular Disease | Admitting: Cardiovascular Disease

## 2014-07-14 DIAGNOSIS — Z955 Presence of coronary angioplasty implant and graft: Secondary | ICD-10-CM | POA: Diagnosis not present

## 2014-07-14 DIAGNOSIS — Z5189 Encounter for other specified aftercare: Secondary | ICD-10-CM | POA: Diagnosis not present

## 2014-07-16 ENCOUNTER — Encounter (HOSPITAL_COMMUNITY)
Admission: RE | Admit: 2014-07-16 | Discharge: 2014-07-16 | Disposition: A | Discharge status: Home / Self Care | Payer: Medicare Other | Source: Ambulatory Visit | Attending: Cardiovascular Disease | Admitting: Cardiovascular Disease

## 2014-07-16 DIAGNOSIS — Z5189 Encounter for other specified aftercare: Secondary | ICD-10-CM | POA: Diagnosis not present

## 2014-07-16 DIAGNOSIS — Z955 Presence of coronary angioplasty implant and graft: Secondary | ICD-10-CM | POA: Diagnosis not present

## 2014-07-18 ENCOUNTER — Encounter (HOSPITAL_COMMUNITY)
Admission: RE | Admit: 2014-07-18 | Discharge: 2014-07-18 | Disposition: A | Discharge status: Home / Self Care | Payer: Medicare Other | Source: Ambulatory Visit | Attending: Cardiovascular Disease | Admitting: Cardiovascular Disease

## 2014-07-18 DIAGNOSIS — Z955 Presence of coronary angioplasty implant and graft: Secondary | ICD-10-CM | POA: Diagnosis not present

## 2014-07-18 DIAGNOSIS — Z5189 Encounter for other specified aftercare: Secondary | ICD-10-CM | POA: Diagnosis not present

## 2014-07-21 ENCOUNTER — Encounter (HOSPITAL_COMMUNITY)
Admission: RE | Admit: 2014-07-21 | Discharge: 2014-07-21 | Disposition: A | Discharge status: Home / Self Care | Payer: Medicare Other | Source: Ambulatory Visit | Attending: Cardiovascular Disease | Admitting: Cardiovascular Disease

## 2014-07-21 DIAGNOSIS — Z5189 Encounter for other specified aftercare: Secondary | ICD-10-CM | POA: Diagnosis not present

## 2014-07-21 DIAGNOSIS — Z955 Presence of coronary angioplasty implant and graft: Secondary | ICD-10-CM | POA: Diagnosis not present

## 2014-07-23 ENCOUNTER — Encounter (HOSPITAL_COMMUNITY)
Admission: RE | Admit: 2014-07-23 | Discharge: 2014-07-23 | Disposition: A | Discharge status: Home / Self Care | Payer: Medicare Other | Source: Ambulatory Visit | Attending: Cardiovascular Disease | Admitting: Cardiovascular Disease

## 2014-07-23 DIAGNOSIS — Z5189 Encounter for other specified aftercare: Secondary | ICD-10-CM | POA: Diagnosis not present

## 2014-07-23 DIAGNOSIS — Z955 Presence of coronary angioplasty implant and graft: Secondary | ICD-10-CM | POA: Diagnosis not present

## 2014-07-25 ENCOUNTER — Encounter (HOSPITAL_COMMUNITY)
Admission: RE | Admit: 2014-07-25 | Discharge: 2014-07-25 | Disposition: A | Discharge status: Home / Self Care | Payer: Medicare Other | Source: Ambulatory Visit | Attending: Cardiovascular Disease | Admitting: Cardiovascular Disease

## 2014-07-25 DIAGNOSIS — Z5189 Encounter for other specified aftercare: Secondary | ICD-10-CM | POA: Diagnosis not present

## 2014-07-25 DIAGNOSIS — Z955 Presence of coronary angioplasty implant and graft: Secondary | ICD-10-CM | POA: Diagnosis not present

## 2014-07-28 ENCOUNTER — Encounter (HOSPITAL_COMMUNITY)
Admission: RE | Admit: 2014-07-28 | Discharge: 2014-07-28 | Disposition: A | Discharge status: Home / Self Care | Payer: Medicare Other | Source: Ambulatory Visit | Attending: Cardiovascular Disease | Admitting: Cardiovascular Disease

## 2014-07-28 DIAGNOSIS — Z5189 Encounter for other specified aftercare: Secondary | ICD-10-CM | POA: Diagnosis not present

## 2014-07-28 DIAGNOSIS — Z955 Presence of coronary angioplasty implant and graft: Secondary | ICD-10-CM | POA: Diagnosis not present

## 2014-07-28 NOTE — Progress Notes (Signed)
Patient is discharged from [facility] today, July 28, 2014 with 36 sessions.  He achieved LTG of 30 minutes of aerobic exercise at max met level of 3.52.  All patient vitals are WNL.  Patient has met with dietician.  Discharge instructions have been reviewed in detail and patient expressed an understanding of material given.  Patient plans to join the maintenance program. Cardiac Rehab will make 1 month, 6 month and 1 year call backs.  Patient had no complaints of any abnormal S/S or pain on their exit visit.

## 2014-07-28 NOTE — Progress Notes (Signed)
Cardiac Rehabilitation Program Outcomes Report   Orientation:  04/08/14 Graduate Date:  07/28/14 Discharge Date:  07/28/14 # of sessions completed: 36  Cardiologist: Dr. Johnsie Cancel Family MD:  Dr. Karie Fetch Time:  9381  A.  Exercise Program:  Tolerates exercise @ 3.52 METS for 30 minutes, Progressed to Phase 4 maintenance program and Discharged  B.  Mental Health:  Good mental attitude  C.  Education/Instruction/Skills  Accurately checks own pulse.  Rest:  66  Exercise:  93, Knows THR for exercise, Uses Perceived Exertion Scale and/or Dyspnea Scale and Attended 100% education classes   D.  Nutrition/Weight Control/Body Composition:  Adherence to prescribed nutrition program: good    E.  Blood Lipids    Lab Results  Component Value Date   CHOL 118 05/27/2014   HDL 33.60* 05/27/2014   LDLCALC 66 05/27/2014   TRIG 92.0 05/27/2014   CHOLHDL 4 05/27/2014    F.  Lifestyle Changes:  Making positive lifestyle changes  G.  Symptoms noted with exercise:  Asymptomatic  Report Completed By:  Felicity Coyer, RN   Comments:  Graduation note.  Pt graduated with 36 sessions.  Pt plans to join maintenance program.

## 2014-07-30 ENCOUNTER — Encounter (HOSPITAL_COMMUNITY)
Admission: RE | Admit: 2014-07-30 | Discharge: 2014-07-30 | Disposition: A | Discharge status: Home / Self Care | Payer: Self-pay | Source: Ambulatory Visit | Attending: Cardiovascular Disease | Admitting: Cardiovascular Disease

## 2014-07-30 ENCOUNTER — Encounter (HOSPITAL_COMMUNITY): Payer: Self-pay

## 2014-07-30 DIAGNOSIS — Z955 Presence of coronary angioplasty implant and graft: Secondary | ICD-10-CM | POA: Insufficient documentation

## 2014-07-30 DIAGNOSIS — Z5189 Encounter for other specified aftercare: Secondary | ICD-10-CM | POA: Insufficient documentation

## 2014-08-01 ENCOUNTER — Encounter (HOSPITAL_COMMUNITY)
Admission: RE | Admit: 2014-08-01 | Discharge: 2014-08-01 | Disposition: A | Discharge status: Home / Self Care | Payer: Self-pay | Source: Ambulatory Visit | Attending: Cardiovascular Disease | Admitting: Cardiovascular Disease

## 2014-08-04 ENCOUNTER — Encounter (HOSPITAL_COMMUNITY)
Admission: RE | Admit: 2014-08-04 | Discharge: 2014-08-04 | Disposition: A | Discharge status: Home / Self Care | Payer: Self-pay | Source: Ambulatory Visit | Attending: Cardiovascular Disease | Admitting: Cardiovascular Disease

## 2014-08-06 ENCOUNTER — Encounter (HOSPITAL_COMMUNITY)
Admission: RE | Admit: 2014-08-06 | Discharge: 2014-08-06 | Disposition: A | Discharge status: Home / Self Care | Payer: Self-pay | Source: Ambulatory Visit | Attending: Cardiovascular Disease | Admitting: Cardiovascular Disease

## 2014-08-08 ENCOUNTER — Encounter (HOSPITAL_COMMUNITY)
Admission: RE | Admit: 2014-08-08 | Discharge: 2014-08-08 | Disposition: A | Discharge status: Home / Self Care | Payer: Self-pay | Source: Ambulatory Visit | Attending: Cardiovascular Disease | Admitting: Cardiovascular Disease

## 2014-08-11 ENCOUNTER — Encounter (HOSPITAL_COMMUNITY): Payer: Self-pay

## 2014-08-13 ENCOUNTER — Encounter (HOSPITAL_COMMUNITY)
Admission: RE | Admit: 2014-08-13 | Discharge: 2014-08-13 | Disposition: A | Discharge status: Home / Self Care | Payer: Self-pay | Source: Ambulatory Visit | Attending: Cardiovascular Disease | Admitting: Cardiovascular Disease

## 2014-08-15 ENCOUNTER — Encounter (HOSPITAL_COMMUNITY)
Admission: RE | Admit: 2014-08-15 | Discharge: 2014-08-15 | Disposition: A | Discharge status: Home / Self Care | Payer: Self-pay | Source: Ambulatory Visit | Attending: Cardiovascular Disease | Admitting: Cardiovascular Disease

## 2014-08-18 ENCOUNTER — Encounter (HOSPITAL_COMMUNITY)
Admission: RE | Admit: 2014-08-18 | Discharge: 2014-08-18 | Disposition: A | Discharge status: Home / Self Care | Payer: Self-pay | Source: Ambulatory Visit | Attending: Cardiovascular Disease | Admitting: Cardiovascular Disease

## 2014-08-20 ENCOUNTER — Encounter (HOSPITAL_COMMUNITY)
Admission: RE | Admit: 2014-08-20 | Discharge: 2014-08-20 | Disposition: A | Discharge status: Home / Self Care | Payer: Self-pay | Source: Ambulatory Visit | Attending: Cardiovascular Disease | Admitting: Cardiovascular Disease

## 2014-08-22 ENCOUNTER — Encounter (HOSPITAL_COMMUNITY)
Admission: RE | Admit: 2014-08-22 | Discharge: 2014-08-22 | Disposition: A | Discharge status: Home / Self Care | Payer: Self-pay | Source: Ambulatory Visit | Attending: Cardiovascular Disease | Admitting: Cardiovascular Disease

## 2014-08-25 ENCOUNTER — Encounter (HOSPITAL_COMMUNITY)
Admission: RE | Admit: 2014-08-25 | Discharge: 2014-08-25 | Disposition: A | Discharge status: Home / Self Care | Payer: Self-pay | Source: Ambulatory Visit | Attending: Cardiovascular Disease | Admitting: Cardiovascular Disease

## 2014-08-27 ENCOUNTER — Encounter (HOSPITAL_COMMUNITY)
Admission: RE | Admit: 2014-08-27 | Discharge: 2014-08-27 | Disposition: A | Discharge status: Home / Self Care | Payer: Self-pay | Source: Ambulatory Visit | Attending: Cardiovascular Disease | Admitting: Cardiovascular Disease

## 2014-08-29 ENCOUNTER — Encounter (HOSPITAL_COMMUNITY): Payer: Self-pay

## 2014-09-01 ENCOUNTER — Encounter (HOSPITAL_COMMUNITY): Payer: Self-pay

## 2014-09-03 ENCOUNTER — Encounter (HOSPITAL_COMMUNITY): Payer: Self-pay

## 2014-09-05 ENCOUNTER — Encounter (HOSPITAL_COMMUNITY): Payer: Self-pay

## 2014-09-08 ENCOUNTER — Encounter (HOSPITAL_COMMUNITY): Payer: Self-pay

## 2014-09-10 ENCOUNTER — Encounter (HOSPITAL_COMMUNITY): Payer: Self-pay

## 2014-09-12 ENCOUNTER — Encounter (HOSPITAL_COMMUNITY): Payer: Self-pay

## 2014-09-15 ENCOUNTER — Encounter (HOSPITAL_COMMUNITY): Payer: Self-pay

## 2014-09-17 ENCOUNTER — Encounter (HOSPITAL_COMMUNITY): Payer: Self-pay

## 2014-09-19 ENCOUNTER — Encounter (HOSPITAL_COMMUNITY): Payer: Self-pay

## 2014-09-19 ENCOUNTER — Other Ambulatory Visit: Payer: Self-pay | Admitting: Cardiovascular Disease

## 2014-09-22 ENCOUNTER — Encounter (HOSPITAL_COMMUNITY): Payer: Self-pay

## 2014-10-02 ENCOUNTER — Other Ambulatory Visit: Payer: Self-pay | Admitting: Cardiovascular Disease

## 2014-10-02 DIAGNOSIS — I6523 Occlusion and stenosis of bilateral carotid arteries: Secondary | ICD-10-CM

## 2014-10-09 ENCOUNTER — Encounter (HOSPITAL_COMMUNITY): Payer: Medicare Other

## 2014-10-14 ENCOUNTER — Ambulatory Visit (HOSPITAL_COMMUNITY): Payer: Medicare Other | Attending: Cardiovascular Disease

## 2014-10-14 DIAGNOSIS — I6523 Occlusion and stenosis of bilateral carotid arteries: Secondary | ICD-10-CM

## 2014-10-15 ENCOUNTER — Other Ambulatory Visit: Payer: Self-pay | Admitting: Cardiovascular Disease

## 2014-10-29 ENCOUNTER — Ambulatory Visit (HOSPITAL_COMMUNITY): Payer: Medicare Other

## 2014-10-29 ENCOUNTER — Encounter (HOSPITAL_COMMUNITY)
Admission: RE | Admit: 2014-10-29 | Discharge: 2014-10-29 | Disposition: A | Discharge status: Home / Self Care | Payer: Self-pay | Source: Ambulatory Visit | Attending: Cardiovascular Disease | Admitting: Cardiovascular Disease

## 2014-10-29 DIAGNOSIS — Z955 Presence of coronary angioplasty implant and graft: Secondary | ICD-10-CM | POA: Insufficient documentation

## 2014-10-29 DIAGNOSIS — I10 Essential (primary) hypertension: Secondary | ICD-10-CM | POA: Insufficient documentation

## 2014-10-29 DIAGNOSIS — I2510 Atherosclerotic heart disease of native coronary artery without angina pectoris: Secondary | ICD-10-CM | POA: Insufficient documentation

## 2014-10-31 ENCOUNTER — Encounter (HOSPITAL_COMMUNITY): Payer: Self-pay

## 2014-11-03 ENCOUNTER — Encounter (HOSPITAL_COMMUNITY)
Admission: RE | Admit: 2014-11-03 | Discharge: 2014-11-03 | Disposition: A | Discharge status: Home / Self Care | Payer: Self-pay | Source: Ambulatory Visit | Attending: Cardiovascular Disease | Admitting: Cardiovascular Disease

## 2014-11-03 ENCOUNTER — Other Ambulatory Visit: Payer: Self-pay | Admitting: Cardiovascular Disease

## 2014-11-05 ENCOUNTER — Encounter (HOSPITAL_COMMUNITY)
Admission: RE | Admit: 2014-11-05 | Discharge: 2014-11-05 | Disposition: A | Discharge status: Home / Self Care | Payer: Self-pay | Source: Ambulatory Visit | Attending: Cardiovascular Disease | Admitting: Cardiovascular Disease

## 2014-11-07 ENCOUNTER — Encounter (HOSPITAL_COMMUNITY)
Admission: RE | Admit: 2014-11-07 | Discharge: 2014-11-07 | Disposition: A | Discharge status: Home / Self Care | Payer: Self-pay | Source: Ambulatory Visit | Attending: Cardiovascular Disease | Admitting: Cardiovascular Disease

## 2014-11-10 ENCOUNTER — Other Ambulatory Visit: Payer: Self-pay

## 2014-11-10 ENCOUNTER — Encounter (HOSPITAL_COMMUNITY)
Admission: RE | Admit: 2014-11-10 | Discharge: 2014-11-10 | Disposition: A | Discharge status: Home / Self Care | Payer: Self-pay | Source: Ambulatory Visit | Attending: Cardiovascular Disease | Admitting: Cardiovascular Disease

## 2014-11-10 MED ORDER — ATORVASTATIN CALCIUM 80 MG PO TABS: 80.0000 mg | ORAL_TABLET | Freq: Every day | ORAL | Status: DC

## 2014-11-12 ENCOUNTER — Encounter (HOSPITAL_COMMUNITY)
Admission: RE | Admit: 2014-11-12 | Discharge: 2014-11-12 | Disposition: A | Discharge status: Home / Self Care | Payer: Self-pay | Source: Ambulatory Visit | Attending: Cardiovascular Disease | Admitting: Cardiovascular Disease

## 2014-11-14 ENCOUNTER — Encounter (HOSPITAL_COMMUNITY)
Admission: RE | Admit: 2014-11-14 | Discharge: 2014-11-14 | Disposition: A | Discharge status: Home / Self Care | Payer: Self-pay | Source: Ambulatory Visit | Attending: Cardiovascular Disease | Admitting: Cardiovascular Disease

## 2014-11-17 ENCOUNTER — Encounter (HOSPITAL_COMMUNITY)
Admission: RE | Admit: 2014-11-17 | Discharge: 2014-11-17 | Disposition: A | Discharge status: Home / Self Care | Payer: Self-pay | Source: Ambulatory Visit | Attending: Cardiovascular Disease | Admitting: Cardiovascular Disease

## 2014-11-19 ENCOUNTER — Encounter (HOSPITAL_COMMUNITY)
Admission: RE | Admit: 2014-11-19 | Discharge: 2014-11-19 | Disposition: A | Discharge status: Home / Self Care | Payer: Self-pay | Source: Ambulatory Visit | Attending: Cardiovascular Disease | Admitting: Cardiovascular Disease

## 2014-11-21 ENCOUNTER — Encounter (HOSPITAL_COMMUNITY)
Admission: RE | Admit: 2014-11-21 | Discharge: 2014-11-21 | Disposition: A | Discharge status: Home / Self Care | Payer: Self-pay | Source: Ambulatory Visit | Attending: Cardiovascular Disease | Admitting: Cardiovascular Disease

## 2014-11-24 ENCOUNTER — Encounter (HOSPITAL_COMMUNITY)
Admission: RE | Admit: 2014-11-24 | Discharge: 2014-11-24 | Disposition: A | Discharge status: Home / Self Care | Payer: Self-pay | Source: Ambulatory Visit | Attending: Cardiovascular Disease | Admitting: Cardiovascular Disease

## 2014-11-26 ENCOUNTER — Encounter (HOSPITAL_COMMUNITY)
Admission: RE | Admit: 2014-11-26 | Discharge: 2014-11-26 | Disposition: A | Discharge status: Home / Self Care | Payer: Self-pay | Source: Ambulatory Visit | Attending: Cardiovascular Disease | Admitting: Cardiovascular Disease

## 2014-11-28 ENCOUNTER — Encounter (HOSPITAL_COMMUNITY): Payer: Self-pay

## 2014-12-01 ENCOUNTER — Encounter (HOSPITAL_COMMUNITY): Payer: Self-pay

## 2014-12-03 ENCOUNTER — Encounter (HOSPITAL_COMMUNITY): Payer: Self-pay

## 2014-12-04 ENCOUNTER — Other Ambulatory Visit: Payer: Self-pay | Admitting: *Deleted

## 2014-12-04 MED ORDER — PRASUGREL HCL 10 MG PO TABS: 10.0000 mg | ORAL_TABLET | Freq: Every day | ORAL | Status: DC

## 2014-12-05 ENCOUNTER — Encounter (HOSPITAL_COMMUNITY): Payer: Self-pay

## 2014-12-08 ENCOUNTER — Encounter (HOSPITAL_COMMUNITY): Payer: Self-pay

## 2014-12-10 ENCOUNTER — Encounter (HOSPITAL_COMMUNITY): Payer: Self-pay

## 2014-12-10 ENCOUNTER — Other Ambulatory Visit: Payer: Self-pay | Admitting: Cardiovascular Disease

## 2014-12-11 NOTE — Telephone Encounter (Signed)
New Message    Brandon [name] pt wife want to know if Rn can call her about her husband because next available was sept 1st with [place] and She dont want to make an appt   With a PA, he needs his medications refilled but Brandon [name] stated that she was told that he has to be seen first.

## 2014-12-12 ENCOUNTER — Encounter (HOSPITAL_COMMUNITY): Payer: Self-pay

## 2014-12-15 ENCOUNTER — Encounter (HOSPITAL_COMMUNITY): Payer: Self-pay

## 2014-12-17 ENCOUNTER — Encounter (HOSPITAL_COMMUNITY): Payer: Self-pay

## 2014-12-19 ENCOUNTER — Encounter (HOSPITAL_COMMUNITY): Payer: Self-pay

## 2014-12-22 ENCOUNTER — Encounter (HOSPITAL_COMMUNITY): Payer: Self-pay

## 2014-12-24 ENCOUNTER — Encounter (HOSPITAL_COMMUNITY): Payer: Self-pay

## 2014-12-26 ENCOUNTER — Encounter (HOSPITAL_COMMUNITY): Payer: Self-pay

## 2014-12-29 ENCOUNTER — Encounter (HOSPITAL_COMMUNITY): Payer: Self-pay

## 2014-12-31 ENCOUNTER — Encounter (HOSPITAL_COMMUNITY): Payer: Self-pay

## 2015-01-02 ENCOUNTER — Encounter (HOSPITAL_COMMUNITY): Payer: Self-pay

## 2015-01-05 ENCOUNTER — Encounter (HOSPITAL_COMMUNITY): Payer: Self-pay

## 2015-01-07 ENCOUNTER — Encounter (HOSPITAL_COMMUNITY): Payer: Self-pay

## 2015-01-09 ENCOUNTER — Encounter (HOSPITAL_COMMUNITY): Payer: Self-pay

## 2015-01-12 ENCOUNTER — Encounter (HOSPITAL_COMMUNITY): Payer: Self-pay

## 2015-01-14 ENCOUNTER — Encounter (HOSPITAL_COMMUNITY): Payer: Self-pay

## 2015-01-16 ENCOUNTER — Encounter (HOSPITAL_COMMUNITY): Payer: Self-pay

## 2015-01-19 ENCOUNTER — Encounter (HOSPITAL_COMMUNITY): Payer: Self-pay

## 2015-01-21 ENCOUNTER — Encounter (HOSPITAL_COMMUNITY): Payer: Self-pay

## 2015-03-17 ENCOUNTER — Other Ambulatory Visit: Payer: Self-pay | Admitting: Cardiovascular Disease

## 2015-03-17 ENCOUNTER — Telehealth: Payer: Self-pay | Admitting: Cardiovascular Disease

## 2015-03-17 DIAGNOSIS — R0989 Other specified symptoms and signs involving the circulatory and respiratory systems: Secondary | ICD-10-CM

## 2015-03-17 DIAGNOSIS — I2510 Atherosclerotic heart disease of native coronary artery without angina pectoris: Secondary | ICD-10-CM

## 2015-03-17 DIAGNOSIS — Z9861 Coronary angioplasty status: Principal | ICD-10-CM

## 2015-03-17 NOTE — Telephone Encounter (Signed)
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  States he is also due for a carotid duplex and office visit with Dr. Johnsie Cancel in November.  Patient would like to come in as few visits as possible, states he would like to have at least 2 of these tests and/or visits on same day.  I advised him that I will forward message to Dr. Johnsie Cancel for approval for stress test and then we will work on scheduling appointments.  He verbalized understanding and agreement.

## 2015-03-17 NOTE — Telephone Encounter (Signed)
Pt needs a stress test for [facility] and   There is no order in the system and next month will be a year   Since he had a Stent put in  He is worried that the insurance will not cover it   Please call

## 2015-03-17 NOTE — Telephone Encounter (Signed)
He has a normal ECG  Does he need an ETT or myovue  Can schedule carotid and stress test same day he sees me in early November

## 2015-03-17 NOTE — Telephone Encounter (Signed)
Verified with patient and with online DOT documentation that GXT is needed.  GXT and carotid duplex ordered and message to [facility] to schedule patient for these tests and for appointment with Dr. Johnsie Cancel.

## 2015-04-10 ENCOUNTER — Telehealth (HOSPITAL_COMMUNITY): Payer: Self-pay

## 2015-04-10 NOTE — Telephone Encounter (Signed)
Encounter complete. 

## 2015-04-15 ENCOUNTER — Ambulatory Visit (HOSPITAL_COMMUNITY)
Admission: RE | Admit: 2015-04-15 | Discharge: 2015-04-15 | Disposition: A | Discharge status: Home / Self Care | Payer: Medicare Other | Source: Ambulatory Visit | Attending: Cardiovascular Disease | Admitting: Cardiovascular Disease

## 2015-04-15 ENCOUNTER — Ambulatory Visit (HOSPITAL_BASED_OUTPATIENT_CLINIC_OR_DEPARTMENT_OTHER)
Admission: RE | Admit: 2015-04-15 | Discharge: 2015-04-15 | Disposition: A | Discharge status: Home / Self Care | Payer: Medicare Other | Source: Ambulatory Visit | Attending: Cardiovascular Disease | Admitting: Cardiovascular Disease

## 2015-04-15 DIAGNOSIS — I2510 Atherosclerotic heart disease of native coronary artery without angina pectoris: Secondary | ICD-10-CM | POA: Insufficient documentation

## 2015-04-15 DIAGNOSIS — R0989 Other specified symptoms and signs involving the circulatory and respiratory systems: Secondary | ICD-10-CM | POA: Diagnosis not present

## 2015-04-15 DIAGNOSIS — Z9861 Coronary angioplasty status: Secondary | ICD-10-CM

## 2015-04-15 LAB — EXERCISE TOLERANCE TEST
CHL RATE OF PERCEIVED EXERTION: 17
CSEPEW: 10.4 METS
Exercise duration (min): 9 min
Exercise duration (sec): 0 s
MPHR: 149 {beats}/min
Peak HR: 141 {beats}/min
Percent HR: 94 %
Rest HR: 63 {beats}/min

## 2015-04-16 NOTE — Progress Notes (Signed)
Patient ID: Brandon [name], male   DOB: 12/02/1945, 69 y.o.   MRN: [id number]             [id number] is a 69 y.o.  admitted to the hospital  04/03/14 with unstable angina. He underwent PTCA and stenting of the mid LAD and OM1. LV function was normal and showed mild inferior basal hypokinesis. He has a history of HTN, 50-60% RICA stenosis.  [place] 04/03/14 Procedural Findings: Hemodynamics:  AO 115 61 LV 121 8              Coronary angiography: Coronary dominance: right  Left mainstem: Normal  Left anterior descending (LAD): 30% proximal and mid 70% distal after large D2              D1: small normal             D2: large 30% ostial stenosis  Left circumflex (LCx):  30% proximal              OM1:  70% proximal stenosis  Right coronary artery (RCA): 30% proximal mid and distal  5 large PDA/PLA branches normal and superdominant    Left ventriculography: Left ventricular systolic function is normal with mild inferobasal hypokinesis    ? 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 Ciprofloxacin Nausea And Vomiting     Current Outpatient Prescriptions  Medication Sig Dispense Refill  . atorvastatin (LIPITOR) 80 MG tablet TAKE ONE TABLET BY MOUTH DAILY AT 6 PM. 30 tablet 1  . butalbital-acetaminophen-caffeine (FIORICET, ESGIC) 50-325-40 MG per tablet Take 1 tablet by mouth every 4 (four) hours as needed for headache.    . esomeprazole (NEXIUM) 20 MG capsule Take 1 caplsule by mouth before breakfast    . losartan (COZAAR) 25 MG tablet TAKE 1 TABLET BY MOUTH ONCE DAILY. 30 tablet 6  . NITROSTAT 0.4 MG SL tablet PLACE 1 TABLET UNDER  TONGUE FOR CHEST PAIN. MAY REPEAT EVERY 5 MIN UPTO 3 DOSES-NO RELIEF,CALL 911. 25 tablet 1  . ondansetron (ZOFRAN) 4 MG tablet Take 4 mg by mouth every 8 (eight) hours as needed for nausea or vomiting.    . prasugrel (EFFIENT) 10 MG TABS tablet Take 1 tablet (10 mg total) by mouth daily. 30 tablet 0   No current facility-administered medications for this visit.    Past Medical History  Diagnosis Date  . PONV (postoperative nausea and vomiting)   . Hypertension   . Shortness of breath     with exertion  . GERD (gastroesophageal reflux disease)   . Blood transfusion     no trouble-51 years ago  . Arthritis   . Chronic prostatitis   . Diverticulitis   . Hematuria   . Hernia of other specified sites of abdominal cavity without mention of obstruction or gangrene   . Other specified forms of hearing loss   . Chronic headaches   . CAD S/P percutaneous coronary angioplasty: DES PCI to mLAD & OM1 04/03/2014    a. Lesion #1: (OM 1 90%) Promus Premier DES 2.25 x 12 mm (2.5 mm)  Lesion #2: (mid LAD 70% ) Promus Premier DES 2.5 x 16 mm  (2.75 mm)     Past Surgical History  Procedure Laterality Date  . Reconstruction mid-face      left face  . Fracture surgery      left arm-25 yrs ago  . Cholecystectomy      aph-15 yrs ago0-jenkins  . 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 Inguinal hernia repair  04/13/2011    Procedure: HERNIA REPAIR INGUINAL ADULT;  Surgeon: [name];  Location: [facility];  Service: General;  Laterality: Right;  . Coronary stent placement  04/03/2014    DES  OMI  &  LAD  . Left heart catheterization with coronary angiogram N/A 04/03/2014    Procedure: LEFT HEART CATHETERIZATION WITH CORONARY ANGIOGRAM;  Surgeon: [name], MD;  Location[facility] CATH LAB;  Service: Cardiovascular;  Laterality: N/A;  . Percutaneous coronary stent intervention (pci-s)  04/03/2014    Procedure: PERCUTANEOUS CORONARY STENT INTERVENTION (PCI-S);  Surgeon:  [name], MD;  Location: [facility] CATH LAB;  Service: Cardiovascular;;    Family History  Problem Relation Age of Onset  . Heart attack Mother   . Heart disease Mother   . Hypertension Mother   . Heart attack Father   . Heart disease Father   . Hypertension Father     Social History   Social History  . Marital Status: Married    Spouse Name: N/A  . Number of Children: N/A  . Years of Education: N/A   Occupational History  . Not on file.   Social History Main Topics  . Smoking status: Former Smoker -- 2.00 packs/day for 14 years    Types: Cigarettes    Quit date: 04/10/1969  . Smokeless tobacco: Never Used  . Alcohol Use: No  . Drug Use: No  . Sexual Activity: Not on file   Other Topics Concern  . Not on file   Social History Narrative    ROS: See HPI otherwise negative  BP 106/62 mmHg  Pulse 71  Ht 5\' 8"  (1.727 m)  Wt 76.259 kg (168 lb 1.9 oz)  BMI 25.57 kg/m2  SpO2 98%  PHYSICAL EXAM: Well-nournished, in no acute distress. Neck: Right carotid bruit, No JVD, HJR,  or thyroid enlargement  Lungs: No tachypnea, clear without wheezing, rales, or rhonchi  Cardiovascular: RRR, PMI not displaced, Normal S1 and S2, positive S4,no murmurs, bruit, thrill, or heave. Right carotid bruit   Abdomen: BS normal. Soft without organomegaly, masses, lesions or tenderness.  Extremities: Right groin without hematoma or hemmorhage at cath site, otherwise lower extremities without cyanosis, clubbing or edema. Good distal pulses bilateral  SKin: Warm, no lesions or rashes   Musculoskeletal: No deformities  Neuro: no focal signs   Wt Readings from Last 3 Encounters:  04/17/15 76.259 kg (168 lb 1.9 oz)  06/19/14 79.833 kg (176 lb)  04/14/14 78.019 kg (172 lb)    Lab Results  Component Value Date   WBC 8.2 04/04/2014   HGB 13.7 04/04/2014   HCT 41.3 04/04/2014   PLT 191 04/04/2014   GLUCOSE 105* 04/04/2014   CHOL 118 05/27/2014   TRIG 92.0 05/27/2014    HDL 33.60* 05/27/2014   LDLCALC 66 05/27/2014   ALT 27 05/27/2014   AST 22 05/27/2014   NA 139 04/04/2014   K 4.4 04/04/2014   CL 103 04/04/2014   CREATININE 1.16 04/04/2014  BUN 18 04/04/2014   CO2 22 04/04/2014   INR 1.0 03/31/2014    EKG:NSR, normal EKG  Plan:  CAD:  11/15 stenting of OM and LAD positive ETT done for DOT  Needs f/u myovue  Will be done Monday If positive will need cath to check stents continue DAT.    HTN:  Well controlled.  Continue current medications and low sodium Dash type diet.    Chol:   Cholesterol is at goal.  Continue current dose of statin and diet Rx.  No myalgias or side effects.  F/U  LFT's in 6 months. Lab Results  Component Value Date   LDLCALC 66 05/27/2014            [name]

## 2015-04-17 ENCOUNTER — Encounter: Payer: Self-pay | Admitting: Cardiovascular Disease

## 2015-04-17 ENCOUNTER — Ambulatory Visit (INDEPENDENT_AMBULATORY_CARE_PROVIDER_SITE_OTHER): Payer: Medicare Other | Admitting: Cardiovascular Disease

## 2015-04-17 ENCOUNTER — Telehealth (HOSPITAL_COMMUNITY): Payer: Self-pay | Admitting: *Deleted

## 2015-04-17 VITALS — BP 106/62 | HR 71 | Ht 68.0 in | Wt 168.1 lb

## 2015-04-17 DIAGNOSIS — I6523 Occlusion and stenosis of bilateral carotid arteries: Secondary | ICD-10-CM | POA: Diagnosis not present

## 2015-04-17 DIAGNOSIS — R9439 Abnormal result of other cardiovascular function study: Secondary | ICD-10-CM

## 2015-04-17 MED ORDER — METOPROLOL TARTRATE 25 MG PO TABS: 25.0000 mg | ORAL_TABLET | Freq: Two times a day (BID) | ORAL | Status: DC

## 2015-04-17 NOTE — Addendum Note (Signed)
Addended by: Devra Dopp E on: 04/17/2015 09:07 AM   Modules accepted: Orders

## 2015-04-17 NOTE — Telephone Encounter (Signed)
Patient given detailed instructions per Myocardial Perfusion Study Information Sheet for the test on 04/20/15 at 8:00. Patient notified to arrive 15 minutes early and that it is imperative to arrive on time for appointment to keep from having the test rescheduled.  If you need to cancel or reschedule your appointment, please call the office within 24 hours of your appointment. Failure to do so may result in a cancellation of your appointment, and a $50 no show fee. Patient verbalized understanding.Brandon [name]

## 2015-04-17 NOTE — Patient Instructions (Addendum)
Medication Instructions:  METOPROLOL  25 MG   1 TWICE  DAILY   TAKE LOSARTAN MID  DAY  LabworK  NONE  Testing/Procedures: Your physician has requested that you have en exercise stress myoview. For further information please visit [email or web address]. Please follow instruction sheet, as given.  Follow-Up: Your physician wants you to follow-up in: [place] will receive a reminder letter in the mail two months in advance. If you don't receive a letter, please call our office to schedule the follow-up appointment.  Any Other Special Instructions Will Be Listed Below (If Applicable).     If you need a refill on your cardiac medications before your next appointment, please call your pharmacy.

## 2015-04-20 ENCOUNTER — Ambulatory Visit (HOSPITAL_COMMUNITY): Payer: Medicare Other | Attending: Cardiology

## 2015-04-20 DIAGNOSIS — I779 Disorder of arteries and arterioles, unspecified: Secondary | ICD-10-CM | POA: Diagnosis not present

## 2015-04-20 DIAGNOSIS — R9439 Abnormal result of other cardiovascular function study: Secondary | ICD-10-CM | POA: Diagnosis not present

## 2015-04-20 DIAGNOSIS — I10 Essential (primary) hypertension: Secondary | ICD-10-CM | POA: Insufficient documentation

## 2015-04-20 DIAGNOSIS — R0609 Other forms of dyspnea: Secondary | ICD-10-CM | POA: Diagnosis not present

## 2015-04-20 LAB — MYOCARDIAL PERFUSION IMAGING
CHL CUP MPHR: 127 {beats}/min
CHL CUP NUCLEAR SDS: 0
CHL CUP RESTING HR STRESS: 60 {beats}/min
CSEPEDS: 0 s
Estimated workload: 10.1 METS
Exercise duration (min): 9 min
LHR: 0.23
LV sys vol: 31 mL
LVDIAVOL: 80 mL
NUC STRESS TID: 0.88
Peak HR: 144 {beats}/min
Percent HR: 96 %
SRS: 6
SSS: 6

## 2015-04-20 MED ORDER — TECHNETIUM TC 99M SESTAMIBI GENERIC - CARDIOLITE
10.7000 | Freq: Once | INTRAVENOUS | Status: AC | PRN
  Administered 2015-04-20: 11 via INTRAVENOUS

## 2015-04-20 MED ORDER — TECHNETIUM TC 99M SESTAMIBI GENERIC - CARDIOLITE
32.8000 | Freq: Once | INTRAVENOUS | Status: AC | PRN
  Administered 2015-04-20: 32.8 via INTRAVENOUS

## 2015-05-07 ENCOUNTER — Other Ambulatory Visit: Payer: Self-pay | Admitting: Cardiovascular Disease

## 2015-05-08 ENCOUNTER — Other Ambulatory Visit: Payer: Self-pay | Admitting: Cardiovascular Disease

## 2015-05-08 NOTE — Telephone Encounter (Signed)
Per 06/19/14 office visit:  CAD S/P percutaneous coronary angioplasty: DES PCI to mLAD & OM1 - Brandon Hector, MD at 06/19/2014 8:26 AM     Status: Written Related Problem: CAD S/P percutaneous coronary angioplasty: DES PCI to mLAD & OM1   Expand All Collapse All   Stable with no angina and good activity level. Continue medical Rx Has nitro on him Effient until 11/16      Patient was seen in November and there is no mention of him stopping the effient. Just want to be sure that he should still be taking. Please advise. Thanks, MI

## 2015-05-11 ENCOUNTER — Telehealth: Payer: Self-pay | Admitting: Cardiovascular Disease

## 2015-05-11 NOTE — Telephone Encounter (Signed)
See refill denial from 05/07/15. Patient is still requesting refill. Please advise. Thanks, MI

## 2015-05-11 NOTE — Telephone Encounter (Signed)
°*  STAT* If patient is at the pharmacy, call can be transferred to refill team.   1. Which medications need to be refilled? (please list name of each medication and dose if known) Effient  2. Which pharmacy/location (including street and city if local pharmacy) is medication to be sent to? [place] in [place]  3. Do they need a 30 day or 90 day supply? 90 Day    Pt is out of this medication

## 2015-05-11 NOTE — Telephone Encounter (Signed)
Stent was over a year ago myovue non ischemic can stop effient and take adult aspirin

## 2015-05-11 NOTE — Telephone Encounter (Deleted)
Office note from 05/2014 says pt to take

## 2015-05-12 MED ORDER — ASPIRIN EC 81 MG PO TBEC: 81.0000 mg | DELAYED_RELEASE_TABLET | Freq: Every day | ORAL | Status: DC

## 2015-05-12 NOTE — Telephone Encounter (Signed)
Called patient about Dr. Kyla Balzarine message. Informed patient since his stent was over a year ago, that he can stop taking effient and take adult aspirin. Patient verbalized understanding and had no further questions at this time.

## 2015-05-14 DIAGNOSIS — K210 Gastro-esophageal reflux disease with esophagitis: Secondary | ICD-10-CM | POA: Diagnosis not present

## 2015-05-14 DIAGNOSIS — I25119 Atherosclerotic heart disease of native coronary artery with unspecified angina pectoris: Secondary | ICD-10-CM | POA: Diagnosis not present

## 2015-05-14 DIAGNOSIS — I10 Essential (primary) hypertension: Secondary | ICD-10-CM | POA: Diagnosis not present

## 2015-05-14 DIAGNOSIS — G4489 Other headache syndrome: Secondary | ICD-10-CM | POA: Diagnosis not present

## 2015-05-27 ENCOUNTER — Other Ambulatory Visit: Payer: Medicare Other

## 2015-06-08 ENCOUNTER — Other Ambulatory Visit: Payer: Self-pay | Admitting: Cardiovascular Disease

## 2015-06-09 ENCOUNTER — Ambulatory Visit (HOSPITAL_COMMUNITY)
Admission: RE | Admit: 2015-06-09 | Discharge: 2015-06-09 | Disposition: A | Discharge status: Home / Self Care | Payer: Medicare Other | Source: Ambulatory Visit | Attending: Pulmonary Disease | Admitting: Pulmonary Disease

## 2015-06-09 ENCOUNTER — Other Ambulatory Visit (HOSPITAL_COMMUNITY): Payer: Self-pay | Admitting: Pulmonary Disease

## 2015-06-09 DIAGNOSIS — M5136 Other intervertebral disc degeneration, lumbar region: Secondary | ICD-10-CM | POA: Diagnosis not present

## 2015-06-09 DIAGNOSIS — M25552 Pain in left hip: Secondary | ICD-10-CM | POA: Diagnosis not present

## 2015-06-09 DIAGNOSIS — X501XXA Overexertion from prolonged static or awkward postures, initial encounter: Secondary | ICD-10-CM | POA: Insufficient documentation

## 2015-06-09 DIAGNOSIS — G8929 Other chronic pain: Secondary | ICD-10-CM | POA: Diagnosis not present

## 2015-06-09 DIAGNOSIS — I25119 Atherosclerotic heart disease of native coronary artery with unspecified angina pectoris: Secondary | ICD-10-CM | POA: Diagnosis not present

## 2015-06-09 DIAGNOSIS — M545 Low back pain: Secondary | ICD-10-CM | POA: Diagnosis not present

## 2015-06-09 DIAGNOSIS — S3992XA Unspecified injury of lower back, initial encounter: Secondary | ICD-10-CM | POA: Diagnosis not present

## 2015-06-09 DIAGNOSIS — I10 Essential (primary) hypertension: Secondary | ICD-10-CM | POA: Diagnosis not present

## 2015-06-09 DIAGNOSIS — S79912A Unspecified injury of left hip, initial encounter: Secondary | ICD-10-CM | POA: Diagnosis not present

## 2015-06-19 DIAGNOSIS — M545 Low back pain: Secondary | ICD-10-CM | POA: Diagnosis not present

## 2015-06-23 ENCOUNTER — Other Ambulatory Visit: Payer: Self-pay | Admitting: Cardiovascular Disease

## 2015-10-12 NOTE — Progress Notes (Signed)
Patient ID: Brandon [name], male   DOB: 04-03-1946, 70 y.o.   MRN: [id number]           [id number] is a 70 y.o.  admitted to the hospital  04/03/14 with unstable angina. He underwent PTCA and stenting of the mid LAD and OM1. LV function was normal and showed mild inferior basal hypokinesis. He has a history of HTN, 50-60% RICA stenosis.  [place] 04/03/14 Procedural Findings: Hemodynamics:  AO 115 61 LV 121 8              Coronary angiography: Coronary dominance: right  Left mainstem: Normal  Left anterior descending (LAD): 30% proximal and mid 70% distal after large D2              D1: small normal             D2: large 30% ostial stenosis  Left circumflex (LCx):  30% proximal              OM1:  70% proximal stenosis  Right coronary artery (RCA): 30% proximal mid and distal  5 large PDA/PLA branches normal and superdominant    Left ventriculography: Left ventricular systolic function is normal with mild inferobasal hypokinesis    ? 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 Ciprofloxacin Nausea And Vomiting     Current Outpatient Prescriptions  Medication Sig Dispense Refill  . aspirin EC 81 MG tablet Take 1 tablet (81 mg total) by mouth daily. 90 tablet 3  . atorvastatin (LIPITOR) 80 MG tablet TAKE ONE TABLET BY MOUTH DAILY AT 6 PM. 30 tablet 9  . butalbital-acetaminophen-caffeine (FIORICET, ESGIC) 50-325-40 MG per tablet Take 1 tablet by mouth every 4 (four) hours as needed for headache.    . esomeprazole (NEXIUM) 20 MG capsule Take 1  caplsule by mouth before breakfast    . losartan (COZAAR) 25 MG tablet TAKE 1 TABLET BY MOUTH ONCE DAILY. 30 tablet 6  . metoprolol tartrate (LOPRESSOR) 25 MG tablet Take 1 tablet (25 mg total) by mouth 2 (two) times daily. 60 tablet 6  . NITROSTAT 0.4 MG SL tablet PLACE 1 TABLET UNDER TONGUE FOR CHEST PAIN. MAY REPEAT EVERY 5 MIN UPTO 3 DOSES-NO RELIEF,CALL 911. 25 tablet 1  . ondansetron (ZOFRAN) 4 MG tablet Take 4 mg by mouth every 8 (eight) hours as needed for nausea or vomiting.     No current facility-administered medications for this visit.    Past Medical History  Diagnosis Date  . PONV (postoperative nausea and vomiting)   . Hypertension   . Shortness of breath     with exertion  . GERD (gastroesophageal reflux disease)   . Blood transfusion     no trouble-51 years ago  . Arthritis   . Chronic prostatitis   . Diverticulitis   . Hematuria   . Hernia of other specified sites of abdominal cavity without mention of obstruction or gangrene   . Other specified forms of  hearing loss   . Chronic headaches   . CAD S/P percutaneous coronary angioplasty: DES PCI to mLAD & OM1 04/03/2014    a. Lesion #1: (OM 1 90%) Promus Premier DES 2.25 x 12 mm (2.5 mm)  Lesion #2: (mid LAD 70% ) Promus Premier DES 2.5 x 16 mm  (2.75 mm)     Past Surgical History  Procedure Laterality Date  . Reconstruction mid-face      left face  . Fracture surgery      left arm-25 yrs ago  . Cholecystectomy      aph-15 yrs ago0-jenkins  . 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 Inguinal hernia repair  04/13/2011    Procedure: HERNIA REPAIR INGUINAL ADULT;  Surgeon: [name];  Location: [facility]S;  Service: General;  Laterality: Right;  . Coronary stent placement  04/03/2014    DES  OMI  &  LAD  . Left heart catheterization with coronary angiogram N/A 04/03/2014    Procedure: LEFT HEART CATHETERIZATION WITH CORONARY ANGIOGRAM;  Surgeon: [name], MD;  Location[facility]  CATH LAB;  Service: Cardiovascular;  Laterality: N/A;  . Percutaneous coronary stent intervention (pci-s)  04/03/2014    Procedure: PERCUTANEOUS CORONARY STENT INTERVENTION (PCI-S);  Surgeon: [name], MD;  [facility] CATH LAB;  Service: Cardiovascular;;    Family History  Problem Relation Age of Onset  . Heart attack Mother   . Heart disease Mother   . Hypertension Mother   . Heart attack Father   . Heart disease Father   . Hypertension Father     Social History   Social History  . Marital Status: Married    Spouse Name: N/A  . Number of Children: N/A  . Years of Education: N/A   Occupational History  . Not on file.   Social History Main Topics  . Smoking status: Former Smoker -- 2.00 packs/day for 14 years    Types: Cigarettes    Quit date: 04/10/1969  . Smokeless tobacco: Never Used  . Alcohol Use: No  . Drug Use: No  . Sexual Activity: Not on file   Other Topics Concern  . Not on file   Social History Narrative    ROS: See HPI otherwise negative  There were no vitals taken for this visit.  PHYSICAL EXAM: Well-nournished, in no acute distress. Neck: Right carotid bruit, No JVD, HJR,  or thyroid enlargement  Lungs: No tachypnea, clear without wheezing, rales, or rhonchi  Cardiovascular: RRR, PMI not displaced, Normal S1 and S2, positive S4,no murmurs, bruit, thrill, or heave. Right carotid bruit   Abdomen: BS normal. Soft without organomegaly, masses, lesions or tenderness.  Extremities: Right groin without hematoma or hemmorhage at cath site, otherwise lower extremities without cyanosis, clubbing or edema. Good distal pulses bilateral  SKin: Warm, no lesions or rashes   Musculoskeletal: No deformities  Neuro: no focal signs   Wt Readings from Last 3 Encounters:  04/20/15 76.204 kg (168 lb)  04/17/15 76.259 kg (168 lb 1.9 oz)  06/19/14 79.833 kg (176 lb)    Lab Results  Component Value Date   WBC 8.2 04/04/2014   HGB 13.7  04/04/2014   HCT 41.3 04/04/2014   PLT 191 04/04/2014   GLUCOSE 105* 04/04/2014   CHOL 118 05/27/2014   TRIG 92.0 05/27/2014   HDL 33.60* 05/27/2014   LDLCALC 66 05/27/2014   ALT 27 05/27/2014   AST 22  05/27/2014   NA 139 04/04/2014   K 4.4 04/04/2014   CL 103 04/04/2014   CREATININE 1.16 04/04/2014   BUN 18 04/04/2014   CO2 22 04/04/2014   INR 1.0 03/31/2014    EKG  Rate 71  :NSR, normal EKG 10/19/15   Plan:  CAD:  11/15 stenting of OM and LAD positive ETT Last myovue  03/2015 non ischemic and normal EF 63% No longer on DAT 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  Continue current medications and low sodium Dash type diet.    Chol:   Cholesterol is at goal.  Continue current dose of statin and diet Rx.  No myalgias or side effects.  F/U  LFT's in 6 months. Lab Results  Component Value Date   LDLCALC 66 05/27/2014   Carotid no change burit duplex f/u 03/2016 known 40-59% RICA  ED:  F/u primary indicated not ideal to use Viagra          [name]

## 2015-10-19 ENCOUNTER — Ambulatory Visit (INDEPENDENT_AMBULATORY_CARE_PROVIDER_SITE_OTHER): Payer: Medicare Other | Admitting: Cardiovascular Disease

## 2015-10-19 ENCOUNTER — Encounter: Payer: Self-pay | Admitting: Cardiovascular Disease

## 2015-10-19 VITALS — BP 108/80 | HR 61 | Ht 68.0 in | Wt 166.8 lb

## 2015-10-19 DIAGNOSIS — Z9861 Coronary angioplasty status: Secondary | ICD-10-CM

## 2015-10-19 DIAGNOSIS — I2510 Atherosclerotic heart disease of native coronary artery without angina pectoris: Secondary | ICD-10-CM | POA: Diagnosis not present

## 2015-10-19 DIAGNOSIS — R0989 Other specified symptoms and signs involving the circulatory and respiratory systems: Secondary | ICD-10-CM

## 2015-10-19 NOTE — Patient Instructions (Addendum)
Medication Instructions:  Your physician recommends that you continue on your current medications as directed. Please refer to the Current Medication list given to you today.  Labwork: NONE  Testing/Procedures: Your physician has requested that you have a carotid duplex in October. This test is an ultrasound of the carotid arteries in your neck. It looks at blood flow through these arteries that supply the brain with blood. Allow one hour for this exam. There are no restrictions or special instructions.  Your physician has requested that you have en exercise stress myoview in October. For further information please visit [email or web address]. Please follow instruction sheet, as given.  Follow-Up: Your physician wants you to follow-up in: 6 months with Dr. Johnsie Cancel. You will receive a reminder letter in the mail two months in advance. If you don't receive a letter, please call our office to schedule the follow-up appointment.   If you need a refill on your cardiac medications before your next appointment, please call your pharmacy.

## 2015-11-19 ENCOUNTER — Telehealth: Payer: Self-pay | Admitting: Cardiovascular Disease

## 2015-11-19 DIAGNOSIS — N411 Chronic prostatitis: Secondary | ICD-10-CM | POA: Diagnosis not present

## 2015-11-19 DIAGNOSIS — K219 Gastro-esophageal reflux disease without esophagitis: Secondary | ICD-10-CM | POA: Diagnosis not present

## 2015-11-19 DIAGNOSIS — I25119 Atherosclerotic heart disease of native coronary artery with unspecified angina pectoris: Secondary | ICD-10-CM | POA: Diagnosis not present

## 2015-11-19 DIAGNOSIS — I10 Essential (primary) hypertension: Secondary | ICD-10-CM | POA: Diagnosis not present

## 2015-11-19 NOTE — Telephone Encounter (Signed)
Called patient back. Patient wanted to know if he was suppose to take Aspirin 81 mg or 325 mg tablet. Informed patient that Aspirin 81 mg is the dose on his medication list and it was not changed at his last office visit last month. Patient is to continue of Aspirin 81 mg by mouth daily. Patient verbalized understanding.

## 2015-11-19 NOTE — Telephone Encounter (Signed)
New message     Question about taking aspirin

## 2015-11-20 DIAGNOSIS — N411 Chronic prostatitis: Secondary | ICD-10-CM | POA: Diagnosis not present

## 2015-11-20 DIAGNOSIS — R739 Hyperglycemia, unspecified: Secondary | ICD-10-CM | POA: Diagnosis not present

## 2015-11-20 DIAGNOSIS — I25119 Atherosclerotic heart disease of native coronary artery with unspecified angina pectoris: Secondary | ICD-10-CM | POA: Diagnosis not present

## 2015-11-20 DIAGNOSIS — I10 Essential (primary) hypertension: Secondary | ICD-10-CM | POA: Diagnosis not present

## 2015-11-20 DIAGNOSIS — K219 Gastro-esophageal reflux disease without esophagitis: Secondary | ICD-10-CM | POA: Diagnosis not present

## 2015-11-25 ENCOUNTER — Other Ambulatory Visit: Payer: Self-pay | Admitting: *Deleted

## 2015-11-25 DIAGNOSIS — E785 Hyperlipidemia, unspecified: Secondary | ICD-10-CM

## 2015-11-25 DIAGNOSIS — R0989 Other specified symptoms and signs involving the circulatory and respiratory systems: Secondary | ICD-10-CM

## 2015-11-25 DIAGNOSIS — R072 Precordial pain: Secondary | ICD-10-CM

## 2015-11-25 DIAGNOSIS — Z9861 Coronary angioplasty status: Principal | ICD-10-CM

## 2015-11-25 DIAGNOSIS — I2510 Atherosclerotic heart disease of native coronary artery without angina pectoris: Secondary | ICD-10-CM

## 2015-11-25 NOTE — Progress Notes (Signed)
Per pt calling [facility] scheduling, he would like his exercise myoview scheduled at [facility] vs here at our office.  Myoview order changed to hospital exercise myoview order [id number]), so Forestine Na will be able to visualize this, for they do not utilize [facility] ordering system.  Order entered and [facility] to schedule the pts test, at location requested.  Will send this message to Dr Johnsie Cancel and RN as an fyi only, to make them aware of change in location for pts myoview.

## 2015-12-08 DIAGNOSIS — Z1211 Encounter for screening for malignant neoplasm of colon: Secondary | ICD-10-CM | POA: Diagnosis not present

## 2016-01-27 ENCOUNTER — Telehealth: Payer: Self-pay | Admitting: Cardiovascular Disease

## 2016-01-27 MED ORDER — AMLODIPINE BESYLATE 10 MG PO TABS: 10.0000 mg | ORAL_TABLET | Freq: Every day | ORAL | 3 refills | Status: DC

## 2016-01-27 NOTE — Telephone Encounter (Signed)
Patient notified of medication change.

## 2016-01-27 NOTE — Telephone Encounter (Signed)
Stop cozaar and start norvasc 10 mg take in  Morning

## 2016-01-27 NOTE — Telephone Encounter (Signed)
Patient states that he takes Cozaar 25 mg at night before bed. He reports being awaking 2 hours later by a headache every night, and states that last night "It felt like my chest was beating hard and fast". Pt reports a BP this morning of 148/88. Pt did attend cardiac rehab after which he stopped by the office to report his sx. Please advise.

## 2016-03-07 ENCOUNTER — Ambulatory Visit (HOSPITAL_COMMUNITY)
Admission: RE | Admit: 2016-03-07 | Discharge: 2016-03-07 | Disposition: A | Discharge status: Home / Self Care | Payer: Medicare Other | Source: Ambulatory Visit | Attending: Cardiovascular Disease | Admitting: Cardiovascular Disease

## 2016-03-07 ENCOUNTER — Inpatient Hospital Stay (HOSPITAL_COMMUNITY): Admission: RE | Admit: 2016-03-07 | Payer: Medicare Other | Source: Ambulatory Visit

## 2016-03-07 ENCOUNTER — Encounter (HOSPITAL_COMMUNITY)
Admission: RE | Admit: 2016-03-07 | Discharge: 2016-03-07 | Disposition: A | Discharge status: Home / Self Care | Payer: Medicare Other | Source: Ambulatory Visit | Attending: Cardiovascular Disease | Admitting: Cardiovascular Disease

## 2016-03-07 ENCOUNTER — Encounter (HOSPITAL_COMMUNITY): Payer: Self-pay

## 2016-03-07 DIAGNOSIS — E784 Other hyperlipidemia: Secondary | ICD-10-CM

## 2016-03-07 DIAGNOSIS — I2510 Atherosclerotic heart disease of native coronary artery without angina pectoris: Secondary | ICD-10-CM

## 2016-03-07 DIAGNOSIS — R0989 Other specified symptoms and signs involving the circulatory and respiratory systems: Secondary | ICD-10-CM | POA: Diagnosis not present

## 2016-03-07 DIAGNOSIS — Z9861 Coronary angioplasty status: Secondary | ICD-10-CM

## 2016-03-07 DIAGNOSIS — E785 Hyperlipidemia, unspecified: Secondary | ICD-10-CM

## 2016-03-07 DIAGNOSIS — R072 Precordial pain: Secondary | ICD-10-CM | POA: Diagnosis not present

## 2016-03-07 DIAGNOSIS — I6523 Occlusion and stenosis of bilateral carotid arteries: Secondary | ICD-10-CM | POA: Diagnosis not present

## 2016-03-07 HISTORY — DX: Malignant (primary) neoplasm, unspecified: C80.1

## 2016-03-07 LAB — NM MYOCAR MULTI W/SPECT W/WALL MOTION / EF
CHL CUP MPHR: 148 {beats}/min
CHL CUP NUCLEAR SRS: 2
CHL CUP NUCLEAR SSS: 5
CHL CUP RESTING HR STRESS: 67 {beats}/min
CHL RATE OF PERCEIVED EXERTION: 15
CSEPED: 9 min
CSEPEDS: 0 s
CSEPEW: 10.1 METS
CSEPHR: 89 %
LV dias vol: 61 mL (ref 62–150)
LVSYSVOL: 18 mL
Peak HR: 133 {beats}/min
RATE: 0.21
SDS: 3
TID: 0.85

## 2016-03-07 MED ORDER — TECHNETIUM TC 99M TETROFOSMIN IV KIT
10.0000 | PACK | Freq: Once | INTRAVENOUS | Status: AC | PRN
  Administered 2016-03-07: 10.3 via INTRAVENOUS

## 2016-03-07 MED ORDER — TECHNETIUM TC 99M TETROFOSMIN IV KIT
30.0000 | PACK | Freq: Once | INTRAVENOUS | Status: AC | PRN
  Administered 2016-03-07: 33 via INTRAVENOUS

## 2016-03-07 MED ORDER — SODIUM CHLORIDE 0.9% FLUSH
INTRAVENOUS | Status: AC
  Administered 2016-03-07: 10 mL via INTRAVENOUS
  Filled 2016-03-07: qty 10

## 2016-03-07 MED ORDER — REGADENOSON 0.4 MG/5ML IV SOLN
INTRAVENOUS | Status: AC
  Filled 2016-03-07: qty 5

## 2016-03-16 ENCOUNTER — Encounter (HOSPITAL_COMMUNITY): Payer: Medicare Other

## 2016-03-16 ENCOUNTER — Inpatient Hospital Stay (HOSPITAL_COMMUNITY): Admission: RE | Admit: 2016-03-16 | Payer: Medicare Other | Source: Ambulatory Visit

## 2016-03-16 ENCOUNTER — Ambulatory Visit (HOSPITAL_COMMUNITY): Payer: Medicare Other

## 2016-03-23 ENCOUNTER — Emergency Department (HOSPITAL_COMMUNITY): Payer: Medicare Other

## 2016-03-23 ENCOUNTER — Emergency Department (HOSPITAL_COMMUNITY)
Admission: EM | Admit: 2016-03-23 | Discharge: 2016-03-23 | Disposition: A | Discharge status: Home / Self Care | Payer: Medicare Other | Attending: Emergency Medicine | Admitting: Emergency Medicine

## 2016-03-23 ENCOUNTER — Encounter (HOSPITAL_COMMUNITY): Payer: Self-pay | Admitting: Emergency Medicine

## 2016-03-23 DIAGNOSIS — M25512 Pain in left shoulder: Secondary | ICD-10-CM | POA: Diagnosis not present

## 2016-03-23 DIAGNOSIS — Z7982 Long term (current) use of aspirin: Secondary | ICD-10-CM | POA: Diagnosis not present

## 2016-03-23 DIAGNOSIS — Z79899 Other long term (current) drug therapy: Secondary | ICD-10-CM | POA: Insufficient documentation

## 2016-03-23 DIAGNOSIS — Y999 Unspecified external cause status: Secondary | ICD-10-CM | POA: Insufficient documentation

## 2016-03-23 DIAGNOSIS — R0789 Other chest pain: Secondary | ICD-10-CM | POA: Insufficient documentation

## 2016-03-23 DIAGNOSIS — I10 Essential (primary) hypertension: Secondary | ICD-10-CM | POA: Insufficient documentation

## 2016-03-23 DIAGNOSIS — W208XXA Other cause of strike by thrown, projected or falling object, initial encounter: Secondary | ICD-10-CM | POA: Insufficient documentation

## 2016-03-23 DIAGNOSIS — M542 Cervicalgia: Secondary | ICD-10-CM | POA: Diagnosis not present

## 2016-03-23 DIAGNOSIS — Z85828 Personal history of other malignant neoplasm of skin: Secondary | ICD-10-CM | POA: Insufficient documentation

## 2016-03-23 DIAGNOSIS — I2510 Atherosclerotic heart disease of native coronary artery without angina pectoris: Secondary | ICD-10-CM | POA: Diagnosis not present

## 2016-03-23 DIAGNOSIS — S060X1A Concussion with loss of consciousness of 30 minutes or less, initial encounter: Secondary | ICD-10-CM | POA: Insufficient documentation

## 2016-03-23 DIAGNOSIS — Y9389 Activity, other specified: Secondary | ICD-10-CM | POA: Insufficient documentation

## 2016-03-23 DIAGNOSIS — Y929 Unspecified place or not applicable: Secondary | ICD-10-CM | POA: Diagnosis not present

## 2016-03-23 DIAGNOSIS — W19XXXA Unspecified fall, initial encounter: Secondary | ICD-10-CM

## 2016-03-23 DIAGNOSIS — R0781 Pleurodynia: Secondary | ICD-10-CM | POA: Diagnosis not present

## 2016-03-23 DIAGNOSIS — S0990XA Unspecified injury of head, initial encounter: Secondary | ICD-10-CM | POA: Diagnosis present

## 2016-03-23 DIAGNOSIS — Z87891 Personal history of nicotine dependence: Secondary | ICD-10-CM | POA: Insufficient documentation

## 2016-03-23 DIAGNOSIS — R55 Syncope and collapse: Secondary | ICD-10-CM | POA: Diagnosis not present

## 2016-03-23 LAB — CBC WITH DIFFERENTIAL/PLATELET
Basophils Absolute: 0 10*3/uL (ref 0.0–0.1)
Basophils Relative: 0 %
Eosinophils Absolute: 0.1 10*3/uL (ref 0.0–0.7)
Eosinophils Relative: 1 %
HEMATOCRIT: 40.9 % (ref 39.0–52.0)
HEMOGLOBIN: 13.6 g/dL (ref 13.0–17.0)
LYMPHS ABS: 2.8 10*3/uL (ref 0.7–4.0)
LYMPHS PCT: 27 %
MCH: 30 pg (ref 26.0–34.0)
MCHC: 33.3 g/dL (ref 30.0–36.0)
MCV: 90.1 fL (ref 78.0–100.0)
MONOS PCT: 6 %
Monocytes Absolute: 0.6 10*3/uL (ref 0.1–1.0)
NEUTROS ABS: 7 10*3/uL (ref 1.7–7.7)
NEUTROS PCT: 66 %
Platelets: 214 10*3/uL (ref 150–400)
RBC: 4.54 MIL/uL (ref 4.22–5.81)
RDW: 12.7 % (ref 11.5–15.5)
WBC: 10.5 10*3/uL (ref 4.0–10.5)

## 2016-03-23 LAB — BASIC METABOLIC PANEL
Anion gap: 6 (ref 5–15)
BUN: 18 mg/dL (ref 6–20)
CHLORIDE: 105 mmol/L (ref 101–111)
CO2: 25 mmol/L (ref 22–32)
CREATININE: 1.14 mg/dL (ref 0.61–1.24)
Calcium: 9.5 mg/dL (ref 8.9–10.3)
GFR calc non Af Amer: >60 mL/min (ref >60)
GLUCOSE: 114 mg/dL — AB (ref 65–99)
Potassium: 4.7 mmol/L (ref 3.5–5.1)
Sodium: 136 mmol/L (ref 135–145)

## 2016-03-23 MED ORDER — HYDROCODONE-ACETAMINOPHEN 5-325 MG PO TABS: 1.0000 | ORAL_TABLET | Freq: Four times a day (QID) | ORAL | 0 refills | Status: DC | PRN

## 2016-03-23 MED ORDER — NAPROXEN 250 MG PO TABS
500.0000 mg | ORAL_TABLET | Freq: Once | ORAL | Status: AC
  Administered 2016-03-23: 500 mg via ORAL
  Filled 2016-03-23: qty 2

## 2016-03-23 MED ORDER — TETANUS-DIPHTH-ACELL PERTUSSIS 5-2.5-18.5 LF-MCG/0.5 IM SUSP: 0.5000 mL | Freq: Once | INTRAMUSCULAR | Status: DC

## 2016-03-23 MED ORDER — MORPHINE SULFATE (PF) 4 MG/ML IV SOLN
4.0000 mg | Freq: Once | INTRAVENOUS | Status: DC
  Filled 2016-03-23: qty 1

## 2016-03-23 NOTE — ED Notes (Signed)
Patient transported to X-ray 

## 2016-03-23 NOTE — ED Triage Notes (Signed)
Patient states "a dead tree fell on my left shoulder, my back, and my head." Patient states "it knocked me out for just a little bit." Patient has small laceration noted to left side of head.

## 2016-03-23 NOTE — ED Provider Notes (Signed)
[facility] DEPT Provider Note   CSN: [id number] Arrival date & time: 03/23/16  1136  By signing my name below, I, Higinio Plan, attest that this documentation has been prepared under the direction and in the presence of Forde Dandy, MD . Electronically Signed: Higinio Plan, Scribe. 03/23/2016. 12:34 PM.  History   Chief Complaint Chief Complaint  Patient presents with  . Head Injury   The history is provided by the patient. No language interpreter was used.   HPI Comments: Brandon [name] is a 70 y.o. male who presents to the Emergency Department for an evaluation of gradually worsening, 9/10, left shoulder pain s/p an injury that occurred this morning. Pt reports he was cutting a tree down this morning when a "15 foot dead tree branch fell straight down on his body." He notes it stuck the posterior part of his head, neck, left shoulder, back and chest. He states he immediately fell onto the ground and lost consciousness for "a few seconds." He notes associated nausea after this incident and states he was able to ambulate without difficulty after his fall. Pt reports his pain is now centralized to his left shoulder, neck and chest; he states his chest pain is exacerbated when taking a deep breath. Pt denies vomiting, shortness of breath, abdominal pain and pain in his knees, hips and pelvis. He states his last tetanus immunization was over 10 years ago.   Past Medical History:  Diagnosis Date  . Arthritis   . Blood transfusion    no trouble-51 years ago  . CAD S/P percutaneous coronary angioplasty: DES PCI to mLAD & OM1 04/03/2014   a. Lesion #1: (OM 1 90%) Promus Premier DES 2.25 x 12 mm (2.5 mm)  Lesion #2: (mid LAD 70% ) Promus Premier DES 2.5 x 16 mm  (2.75 mm)   . Cancer (HCC)    Skin  . Chronic headaches   . Chronic prostatitis   . Diverticulitis   . GERD (gastroesophageal reflux disease)   . Hematuria   . Hernia of other specified sites of abdominal cavity without mention of  obstruction or gangrene   . Hypertension   . Other specified forms of hearing loss   . PONV (postoperative nausea and vomiting)   . Shortness of breath    with exertion    Patient Active Problem List   Diagnosis Date Noted  . Hyperlipidemia 04/14/2014  . Unstable angina ([place]) 04/03/2014  . CAD S/P percutaneous coronary angioplasty: DES PCI to mLAD & OM1 04/03/2014    Class: Status post  . Right carotid bruit 11/14/2012  . HTN (hypertension) 08/02/2012  . Chest pain 06/21/2012  . Fatigue 06/21/2012  . Dyspnea 06/21/2012    Past Surgical History:  Procedure Laterality Date  . CHOLECYSTECTOMY     aph-15 yrs ago0-jenkins  . CORONARY STENT PLACEMENT  04/03/2014   DES  OMI  &  LAD  . 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 INGUINAL HERNIA REPAIR  04/13/2011   Procedure: HERNIA REPAIR INGUINAL ADULT;  Surgeon: [name];  Location: [facility];  Service: General;  Laterality: Right;  . LEFT HEART CATHETERIZATION WITH CORONARY ANGIOGRAM N/A 04/03/2014   Procedure: LEFT HEART CATHETERIZATION WITH CORONARY ANGIOGRAM;  Surgeon: [name], MD;  Location[facility] CATH LAB;  Service: Cardiovascular;  Laterality: N/A;  . PERCUTANEOUS CORONARY STENT INTERVENTION (PCI-S)  04/03/2014   Procedure: PERCUTANEOUS CORONARY STENT INTERVENTION (PCI-S);  Surgeon: [name], MD;  Location: [facility] CATH LAB;  Service: Cardiovascular;;  . RECONSTRUCTION MID-FACE     left face    Home Medications    Prior to Admission medications   Medication Sig Start Date End Date Taking? Authorizing Provider  amLODipine (NORVASC) 10 MG tablet Take 1 tablet (10 mg total) by mouth daily. 01/27/16 04/26/16  Josue Hector, MD  aspirin EC 81 MG tablet Take 1 tablet (81 mg total) by mouth daily. 05/12/15   Josue Hector, MD  atorvastatin (LIPITOR) 80 MG tablet TAKE ONE TABLET BY MOUTH DAILY AT 6 PM. 06/23/15   Josue Hector, MD    butalbital-acetaminophen-caffeine (FIORICET, ESGIC) [phone] MG per tablet Take 1 tablet by mouth every 4 (four) hours as needed for headache.    Historical Provider, MD  esomeprazole (NEXIUM) 20 MG capsule Take 1 caplsule by mouth before breakfast 04/09/14   Josue Hector, MD  NITROSTAT 0.4 MG SL tablet PLACE 1 TABLET UNDER TONGUE FOR CHEST PAIN. MAY REPEAT EVERY 5 MIN UPTO 3 DOSES-NO RELIEF,CALL 911. 09/19/14   Josue Hector, MD  ondansetron (ZOFRAN) 4 MG tablet Take 4 mg by mouth every 8 (eight) hours as needed for nausea or vomiting.    Historical Provider, MD    Family History Family History  Problem Relation Age of Onset  . Heart attack Mother   . Heart disease Mother   . Hypertension Mother   . Heart attack Father   . Heart disease Father   . Hypertension Father     Social History Social History  Substance Use Topics  . Smoking status: Former Smoker    Packs/day: 2.00    Years: 14.00    Types: Cigarettes    Quit date: 04/10/1969  . Smokeless tobacco: Never Used  . Alcohol use No     Allergies   Ciprofloxacin   Review of Systems Review of Systems  Respiratory: Negative for shortness of breath.   Cardiovascular: Positive for chest pain.  Gastrointestinal: Positive for nausea. Negative for abdominal pain and vomiting.  Musculoskeletal: Positive for arthralgias (left shoulder), back pain and neck pain.  Skin: Positive for wound.  Neurological: Positive for syncope.  All other systems reviewed and are negative.  Physical Exam Updated Vital Signs BP 125/81 (BP Location: Right Arm)   Pulse 82   Temp 97.7 F (36.5 C) (Oral)   Resp 22   Ht 5\' 8"  (1.727 m)   Wt 168 lb (76.2 kg)   SpO2 98%   BMI 25.54 kg/m   Physical Exam Physical Exam  Nursing note and vitals reviewed. Constitutional: Well developed, well nourished, non-toxic, and in no acute distress Head: Normocephalic and atraumatic.  Mouth/Throat: Oropharynx is clear and moist.  Neck: Normal range  of motion. Neck supple. No cervical spine tenderness.  Cardiovascular: Normal rate and regular rhythm.   Pulmonary/Chest: Effort normal and breath sounds normal. left sided chest wall tenderness.  Abdominal: Soft. There is no tenderness. There is no rebound and no guarding.  Musculoskeletal: Normal range of motion. Bruising and soft tissue abrasion over left shoulder. No TLS spine tenderness.  Neurological: Alert, no facial droop, fluent speech, moves all extremities symmetrically Skin: Skin is warm and dry.  Psychiatric: Cooperative  ED Treatments / Results  Labs (all labs ordered are listed, but only abnormal results are displayed) Labs Reviewed - No data to display  EKG  EKG Interpretation None  Radiology Dg Ribs Unilateral W/chest Left  Result Date: April 18, 2016 CLINICAL DATA:  He was cutting down a dead tree and it fell landing on top of his lt shoulder. Pain lt upper anterior chest area/shoulder. There is a few abrasions and some swelling EXAM: LEFT RIBS AND CHEST - 3+ VIEW COMPARISON:  12/15/2003 FINDINGS: No rib fracture or rib lesion. Cardiac silhouette is normal in size. There is a left coronary artery stent. No mediastinal or hilar masses or convincing adenopathy. Lungs are clear.  No pleural effusion or pneumothorax. IMPRESSION: 1. No rib fracture or rib lesion. 2. No acute cardiopulmonary disease. Electronically Signed   By: Lajean Manes M.D.   On: 04-18-16 13:02   Ct Head Wo Contrast  Result Date: 04/18/2016 CLINICAL DATA:  Dead tree fell onto patient's left shoulder. Loss of consciousness. Left-sided neck pain. EXAM: CT HEAD WITHOUT CONTRAST CT CERVICAL SPINE WITHOUT CONTRAST TECHNIQUE: Multidetector CT imaging of the head and cervical spine was performed following the standard protocol without intravenous contrast. Multiplanar CT image reconstructions of the cervical spine were also generated. COMPARISON:  12/05/2013 FINDINGS: CT HEAD FINDINGS Brain: Prominence of  the sulci and ventricles identified compatible with brain atrophy. There is mild low attenuation within the subcortical and periventricular white matter compatible with chronic microvascular disease. No abnormal extra-axial fluid collection, intracranial hemorrhage or mass identified. Vascular: No hyperdense vessel or unexpected calcification. Skull: Normal. Negative for fracture or focal lesion. Sinuses/Orbits: No acute finding. Other: None CT CERVICAL SPINE FINDINGS Alignment: Normal. Skull base and vertebrae: No acute fracture. No primary bone lesion or focal pathologic process. Chronic deformities involving the laminae at of the C5 vertebra identified. Likely related to remote trauma. Soft tissues and spinal canal: No prevertebral fluid or swelling. No visible canal hematoma. Disc levels: Degenerative disc disease noted. Most advanced at C5-C6. Upper chest: Lung apices appear clear Other: None IMPRESSION: 1. No acute intracranial abnormalities. 2. Chronic microvascular disease and brain atrophy. 3. No evidence for acute cervical spine fracture. Chronic posttraumatic deformity involves the posterior elements of C5. 4. Multi level degenerative disc disease. Electronically Signed   By: Kerby Moors M.D.   On: 04/18/16 13:27   Ct Cervical Spine Wo Contrast  Result Date: 2016/04/18 CLINICAL DATA:  Dead tree fell onto patient's left shoulder. Loss of consciousness. Left-sided neck pain. EXAM: CT HEAD WITHOUT CONTRAST CT CERVICAL SPINE WITHOUT CONTRAST TECHNIQUE: Multidetector CT imaging of the head and cervical spine was performed following the standard protocol without intravenous contrast. Multiplanar CT image reconstructions of the cervical spine were also generated. COMPARISON:  12/05/2013 FINDINGS: CT HEAD FINDINGS Brain: Prominence of the sulci and ventricles identified compatible with brain atrophy. There is mild low attenuation within the subcortical and periventricular white matter compatible with  chronic microvascular disease. No abnormal extra-axial fluid collection, intracranial hemorrhage or mass identified. Vascular: No hyperdense vessel or unexpected calcification. Skull: Normal. Negative for fracture or focal lesion. Sinuses/Orbits: No acute finding. Other: None CT CERVICAL SPINE FINDINGS Alignment: Normal. Skull base and vertebrae: No acute fracture. No primary bone lesion or focal pathologic process. Chronic deformities involving the laminae at of the C5 vertebra identified. Likely related to remote trauma. Soft tissues and spinal canal: No prevertebral fluid or swelling. No visible canal hematoma. Disc levels: Degenerative disc disease noted. Most advanced at C5-C6. Upper chest: Lung apices appear clear Other: None IMPRESSION: 1. No acute intracranial abnormalities. 2. Chronic microvascular disease and brain atrophy. 3. No evidence for acute cervical spine fracture. Chronic posttraumatic deformity  involves the posterior elements of C5. 4. Multi level degenerative disc disease. Electronically Signed   By: Kerby Moors M.D.   On: 03/23/2016 13:27   Dg Shoulder Left  Result Date: 03/23/2016 CLINICAL DATA:  He was cutting down a dead tree and it fell landing on top of his lt shoulder. Pain lt upper anterior chest area/shoulder. There is a few abrasions and some swelling EXAM: LEFT SHOULDER - 2+ VIEW COMPARISON:  None. FINDINGS: No fracture.  No dislocation. Mild AC joint osteoarthritis. Glenohumeral joint is well preserved. No bone lesion. Soft tissues are unremarkable. IMPRESSION: No fracture or dislocation. Electronically Signed   By: Lajean Manes M.D.   On: 03/23/2016 13:03    Procedures Procedures (including critical care time)  Medications Ordered in ED Medications - No data to display  DIAGNOSTIC STUDIES:  Oxygen Saturation is 98% on RA, normal by my interpretation.    COORDINATION OF CARE:  12:30 PM Discussed treatment plan with pt at bedside and pt agreed to  plan.  Initial Impression / Assessment and Plan / ED Course  I have reviewed the triage vital signs and the nursing notes.  Pertinent labs & imaging results that were available during my care of the patient were reviewed by me and considered in my medical decision making (see chart for details).  Clinical Course   70 year old male who presents after head injury. He is non-toxic and in no acute distress. Vital signs normal. He is neuro in tact. With soft tissue swelling noted to the left shoulder. Mild left chest wall tenderness to palpation as well. Refuses tetanus in ED. Offered pain medication, and he also refuses this at this time.  CT head and cervical spine visualized and shows no acute intracranial or cervical spine processes. X-ray of the left shoulder and chest performed, visualized, and shows no acute traumatic injury of the chest wall or left shoulder. Basic blood work reassuring.   No other injuries noted on repeat exam. Discussed supportive management for home Strict return and follow-up instructions reviewed. He expressed understanding of all discharge instructions and felt comfortable with the plan of care.  The patient appears reasonably screened and/or stabilized for discharge and I doubt any other medical condition or other [facility] requiring further screening, evaluation, or treatment in the ED at this time prior to discharge.    I personally performed the services described in this documentation, which was scribed in my presence. The recorded information has been reviewed and is accurate.   Final Clinical Impressions(s) / ED Diagnoses   Final diagnoses:  None    New Prescriptions New Prescriptions   No medications on file     Forde Dandy, MD 03/23/16 1645

## 2016-03-23 NOTE — Discharge Instructions (Signed)
Your scans does not show serious injury of the head, neck, shoulder or chest. You likely have a lot of bruising. Take naprosyn or ibuprofen for pain and use norco for breakthrough pain. Return for worsening symptoms, including difficulty breathing, passing out, confusion, intractable vomiting or any other symptoms concerning to you.

## 2016-03-28 DIAGNOSIS — M549 Dorsalgia, unspecified: Secondary | ICD-10-CM | POA: Diagnosis not present

## 2016-03-28 DIAGNOSIS — R091 Pleurisy: Secondary | ICD-10-CM | POA: Diagnosis not present

## 2016-03-28 DIAGNOSIS — I10 Essential (primary) hypertension: Secondary | ICD-10-CM | POA: Diagnosis not present

## 2016-05-09 DIAGNOSIS — K219 Gastro-esophageal reflux disease without esophagitis: Secondary | ICD-10-CM | POA: Diagnosis not present

## 2016-05-09 DIAGNOSIS — I25119 Atherosclerotic heart disease of native coronary artery with unspecified angina pectoris: Secondary | ICD-10-CM | POA: Diagnosis not present

## 2016-05-09 DIAGNOSIS — J411 Mucopurulent chronic bronchitis: Secondary | ICD-10-CM | POA: Diagnosis not present

## 2016-05-09 DIAGNOSIS — I10 Essential (primary) hypertension: Secondary | ICD-10-CM | POA: Diagnosis not present

## 2016-06-03 NOTE — Progress Notes (Deleted)
Patient ID: Brandon [name], male   DOB: 02-Dec-1945, 71 y.o.   MRN: [id number]           [id number] is a 71 y.o.  admitted to the hospital  04/03/14 with unstable angina. He underwent PTCA and stenting of the mid LAD and OM1. LV function was normal and showed mild inferior basal hypokinesis. He has a history of HTN, 50-60% RICA stenosis.  [place] 04/03/14 Procedural Findings: Hemodynamics:  AO 115 61 LV 121 8              Coronary angiography: Coronary dominance: right  Left mainstem: Normal  Left anterior descending (LAD): 30% proximal and mid 70% distal after large D2              D1: small normal             D2: large 30% ostial stenosis  Left circumflex (LCx):  30% proximal              OM1:  70% proximal stenosis  Right coronary artery (RCA): 30% proximal mid and distal  5 large PDA/PLA branches normal and superdominant    Left ventriculography: Left ventricular systolic function is normal with mild inferobasal hypokinesis    ? 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 Ciprofloxacin Nausea And Vomiting     Current Outpatient Prescriptions  Medication Sig Dispense Refill  . amLODipine (NORVASC) 10 MG tablet Take 1 tablet (10 mg total) by mouth daily. 90 tablet 3  . aspirin EC 81 MG tablet Take 1 tablet (81 mg total) by mouth daily. 90 tablet 3  . atorvastatin (LIPITOR) 80 MG tablet TAKE ONE TABLET  BY MOUTH DAILY AT 6 PM. 30 tablet 9  . butalbital-acetaminophen-caffeine (FIORICET, ESGIC) 50-325-40 MG per tablet Take 1 tablet by mouth every 4 (four) hours as needed for headache.    . esomeprazole (NEXIUM) 20 MG capsule Take 1 caplsule by mouth before breakfast    . HYDROcodone-acetaminophen (NORCO/VICODIN) 5-325 MG tablet Take 1 tablet by mouth every 6 (six) hours as needed for moderate pain or severe pain. 6 tablet 0  . NITROSTAT 0.4 MG SL tablet PLACE 1 TABLET UNDER TONGUE FOR CHEST PAIN. MAY REPEAT EVERY 5 MIN UPTO 3 DOSES-NO RELIEF,CALL 911. 25 tablet 1  . ondansetron (ZOFRAN) 4 MG tablet Take 4 mg by mouth every 8 (eight) hours as needed for nausea or vomiting.     No current facility-administered medications for this visit.     Past Medical History:  Diagnosis Date  . Arthritis   . Blood transfusion    no trouble-51 years ago  . CAD S/P percutaneous coronary angioplasty: DES PCI to mLAD & OM1 04/03/2014   a. Lesion #1: (OM 1 90%) Promus Premier DES 2.25 x 12 mm (2.5 mm)  Lesion #2: (mid LAD  70% ) Promus Premier DES 2.5 x 16 mm  (2.75 mm)   . Cancer (HCC)    Skin  . Chronic headaches   . Chronic prostatitis   . Diverticulitis   . GERD (gastroesophageal reflux disease)   . Hematuria   . Hernia of other specified sites of abdominal cavity without mention of obstruction or gangrene   . Hypertension   . Other specified forms of hearing loss   . PONV (postoperative nausea and vomiting)   . Shortness of breath    with exertion    Past Surgical History:  Procedure Laterality Date  . CHOLECYSTECTOMY     aph-15 yrs ago0-jenkins  . CORONARY STENT PLACEMENT  04/03/2014   DES  OMI  &  LAD  . 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 INGUINAL HERNIA REPAIR  04/13/2011   Procedure: HERNIA REPAIR INGUINAL ADULT;  Surgeon: [name];  Location: [facility]S;  Service: General;  Laterality: Right;  . LEFT HEART  CATHETERIZATION WITH CORONARY ANGIOGRAM N/A 04/03/2014   Procedure: LEFT HEART CATHETERIZATION WITH CORONARY ANGIOGRAM;  Surgeon: [name], MD;  Location: [facility] CATH LAB;  Service: Cardiovascular;  Laterality: N/A;  . PERCUTANEOUS CORONARY STENT INTERVENTION (PCI-S)  04/03/2014   Procedure: PERCUTANEOUS CORONARY STENT INTERVENTION (PCI-S);  Surgeon: [name], MD;  Location: [facility] CATH LAB;  Service: Cardiovascular;;  . RECONSTRUCTION MID-FACE     left face    Family History  Problem Relation Age of Onset  . Heart attack Mother   . Heart disease Mother   . Hypertension Mother   . Heart attack Father   . Heart disease Father   . Hypertension Father     Social History   Social History  . Marital status: Married    Spouse name: N/A  . Number of children: N/A  . Years of education: N/A   Occupational History  . Not on file.   Social History Main Topics  . Smoking status: Former Smoker    Packs/day: 2.00    Years: 14.00    Types: Cigarettes    Quit date: 04/10/1969  . Smokeless tobacco: Never Used  . Alcohol use No  . Drug use: No  . Sexual activity: Not on file   Other Topics Concern  . Not on file   Social History Narrative  . No narrative on file    ROS: See HPI otherwise negative  There were no vitals taken for this visit.  PHYSICAL EXAM: Well-nournished, in no acute distress. Neck: Right carotid bruit, No JVD, HJR,  or thyroid enlargement  Lungs: No tachypnea, clear without wheezing, rales, or rhonchi  Cardiovascular: RRR, PMI not displaced, Normal S1 and S2, positive S4,no murmurs, bruit, thrill, or heave. Right carotid bruit   Abdomen: BS normal. Soft without organomegaly, masses, lesions or tenderness.  Extremities: Right groin without hematoma or hemmorhage at cath site, otherwise lower extremities without cyanosis, clubbing or edema. Good distal pulses bilateral  SKin: Warm, no lesions or rashes   Musculoskeletal: No  deformities  Neuro: no focal signs   Wt Readings from Last 3 Encounters:  03/23/16 168 lb (76.2 kg)  10/19/15 166 lb 12.8 oz (75.7 kg)  04/20/15 168 lb (76.2 kg)    Lab Results  Component Value Date   WBC 10.5 03/23/2016   HGB 13.6 03/23/2016   HCT 40.9 03/23/2016  PLT 214 03/23/2016   GLUCOSE 114 (H) 03/23/2016   CHOL 118 05/27/2014   TRIG 92.0 05/27/2014   HDL 33.60 (L) 05/27/2014   LDLCALC 66 05/27/2014   ALT 27 05/27/2014   AST 22 05/27/2014   NA 136 03/23/2016   K 4.7 03/23/2016   CL 105 03/23/2016   CREATININE 1.14 03/23/2016   BUN 18 03/23/2016   CO2 25 03/23/2016   INR 1.0 03/31/2014    EKG  Rate 71  :NSR, normal EKG 10/19/15   Plan:  CAD:  11/15 stenting of OM and LAD positive ETT Last myovue  03/2015 non ischemic and normal EF 63% No longer on DAT 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  Continue current medications and low sodium Dash type diet.    Chol:   Cholesterol is at goal.  Continue current dose of statin and diet Rx.  No myalgias or side effects.  F/U  LFT's in 6 months. Lab Results  Component Value Date   LDLCALC 66 05/27/2014   Carotid no change bruit duplex 03/07/16 with less than 50% bilateral ICA stenosis   ED:  F/u primary indicated not ideal to use Viagra          [name]

## 2016-06-06 DIAGNOSIS — R112 Nausea with vomiting, unspecified: Secondary | ICD-10-CM | POA: Diagnosis not present

## 2016-06-06 DIAGNOSIS — N410 Acute prostatitis: Secondary | ICD-10-CM | POA: Diagnosis not present

## 2016-06-08 ENCOUNTER — Ambulatory Visit: Payer: Medicare Other | Admitting: Cardiovascular Disease

## 2016-06-10 DIAGNOSIS — J200 Acute bronchitis due to Mycoplasma pneumoniae: Secondary | ICD-10-CM | POA: Diagnosis not present

## 2016-06-10 DIAGNOSIS — I25119 Atherosclerotic heart disease of native coronary artery with unspecified angina pectoris: Secondary | ICD-10-CM | POA: Diagnosis not present

## 2016-06-10 DIAGNOSIS — I10 Essential (primary) hypertension: Secondary | ICD-10-CM | POA: Diagnosis not present

## 2016-06-16 ENCOUNTER — Other Ambulatory Visit: Payer: Self-pay | Admitting: Cardiovascular Disease

## 2016-08-08 DIAGNOSIS — N411 Chronic prostatitis: Secondary | ICD-10-CM | POA: Diagnosis not present

## 2016-08-08 DIAGNOSIS — I10 Essential (primary) hypertension: Secondary | ICD-10-CM | POA: Diagnosis not present

## 2016-08-08 DIAGNOSIS — K219 Gastro-esophageal reflux disease without esophagitis: Secondary | ICD-10-CM | POA: Diagnosis not present

## 2016-08-08 DIAGNOSIS — I25119 Atherosclerotic heart disease of native coronary artery with unspecified angina pectoris: Secondary | ICD-10-CM | POA: Diagnosis not present

## 2016-08-09 NOTE — Progress Notes (Signed)
Patient ID: Brandon [name], male   DOB: Mar 10, 1946, 71 y.o.   MRN: [id number]           [name] is a 71 y.o.  admitted to the hospital  04/03/14 with unstable angina. He underwent PTCA and stenting of the mid LAD and OM1. LV function was normal and showed mild inferior basal hypokinesis. He has a history of HTN, 50-60% RICA stenosis.   [place] 04/03/14 Procedural Findings: Hemodynamics:  AO 115 61 LV 121 8              Coronary angiography: Coronary dominance: right  Left mainstem: Normal  Left anterior descending (LAD): 30% proximal and mid 70% distal after large D2              D1: small normal             D2: large 30% ostial stenosis  Left circumflex (LCx):  30% proximal              OM1:  70% proximal stenosis  Right coronary artery (RCA): 30% proximal mid and distal  5 large PDA/PLA branches normal and superdominant    Left ventriculography: Left ventricular systolic function is normal with mild inferobasal hypokinesis    ? 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  Allergies  Allergen Reactions  . Ciprofloxacin Nausea And Vomiting     Current Outpatient Prescriptions  Medication Sig Dispense Refill  . aspirin EC 81 MG tablet Take 1 tablet (81 mg total) by mouth daily. 90 tablet 3  . atorvastatin (LIPITOR) 80 MG tablet Take 1 tablet (80 mg total) by mouth daily at 6 PM. 30 tablet 4  . butalbital-acetaminophen-caffeine (FIORICET, ESGIC) 50-325-40 MG per tablet Take 1  tablet by mouth every 4 (four) hours as needed for headache.    . esomeprazole (NEXIUM) 20 MG capsule Take 1 caplsule by mouth before breakfast    . HYDROcodone-acetaminophen (NORCO/VICODIN) 5-325 MG tablet Take 1 tablet by mouth every 6 (six) hours as needed for moderate pain or severe pain. 6 tablet 0  . NITROSTAT 0.4 MG SL tablet PLACE 1 TABLET UNDER TONGUE FOR CHEST PAIN. MAY REPEAT EVERY 5 MIN UPTO 3 DOSES-NO RELIEF,CALL 911. 25 tablet 1  . ondansetron (ZOFRAN) 4 MG tablet Take 4 mg by mouth every 8 (eight) hours as needed for nausea or vomiting.    Marland Kitchen amLODipine (NORVASC) 10 MG tablet Take 1 tablet (10 mg total) by mouth daily. 90 tablet 3   No current facility-administered medications for this visit.     Past Medical History:  Diagnosis Date  . Arthritis   . Blood transfusion    no trouble-51 years ago  . CAD S/P percutaneous coronary angioplasty: DES PCI to mLAD & OM1 04/03/2014   a. Lesion #1: (OM 1 90%) Promus Premier DES 2.25 x 12 mm (2.5 mm)  Lesion #2: (mid LAD 70% ) Promus Premier DES  2.5 x 16 mm  (2.75 mm)   . Cancer (HCC)    Skin  . Chronic headaches   . Chronic prostatitis   . Diverticulitis   . GERD (gastroesophageal reflux disease)   . Hematuria   . Hernia of other specified sites of abdominal cavity without mention of obstruction or gangrene   . Hypertension   . Other specified forms of hearing loss   . PONV (postoperative nausea and vomiting)   . Shortness of breath    with exertion    Past Surgical History:  Procedure Laterality Date  . CHOLECYSTECTOMY     aph-15 yrs ago0-jenkins  . CORONARY STENT PLACEMENT  04/03/2014   DES  OMI  &  LAD  . 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 INGUINAL HERNIA REPAIR  04/13/2011   Procedure: HERNIA REPAIR INGUINAL ADULT;  Surgeon: [name];  Location: [facility];  Service: General;  Laterality: Right;  . LEFT HEART CATHETERIZATION WITH CORONARY  ANGIOGRAM N/A 04/03/2014   Procedure: LEFT HEART CATHETERIZATION WITH CORONARY ANGIOGRAM;  Surgeon: [name], MD;  Location: [facility] CATH LAB;  Service: Cardiovascular;  Laterality: N/A;  . PERCUTANEOUS CORONARY STENT INTERVENTION (PCI-S)  04/03/2014   Procedure: PERCUTANEOUS CORONARY STENT INTERVENTION (PCI-S);  Surgeon: [name], MD;  Location: [facility] CATH LAB;  Service: Cardiovascular;;  . RECONSTRUCTION MID-FACE     left face    Family History  Problem Relation Age of Onset  . Heart attack Mother   . Heart disease Mother   . Hypertension Mother   . Heart attack Father   . Heart disease Father   . Hypertension Father     Social History   Social History  . Marital status: Married    Spouse name: N/A  . Number of children: N/A  . Years of education: N/A   Occupational History  . Not on file.   Social History Main Topics  . Smoking status: Former Smoker    Packs/day: 2.00    Years: 14.00    Types: Cigarettes    Quit date: 04/10/1969  . Smokeless tobacco: Never Used  . Alcohol use No  . Drug use: No  . Sexual activity: Not on file   Other Topics Concern  . Not on file   Social History Narrative  . No narrative on file    ROS: See HPI otherwise negative  BP 130/78   Pulse 72   Ht 5\' 8"  (1.727 m)   Wt 172 lb 12 oz (78.4 kg)   SpO2 97%   BMI 26.27 kg/m   Affect appropriate Healthy:  appears stated age HEENT: normal Neck supple with no adenopathy JVP normal bilateral  bruits no thyromegaly Lungs clear with no wheezing and good diaphragmatic motion Heart:  S1/S2 no murmur, no rub, gallop or click PMI normal Abdomen: benighn, BS positve, no tenderness, no AAA no bruit.  No HSM or HJR Distal pulses intact with no bruits No edema Neuro non-focal Skin warm and dry No muscular weakness left arm scars broken multiple times copper bracelet     Wt Readings from Last 3 Encounters:  08/17/16 172 lb 12 oz (78.4 kg)  03/23/16 168 lb (76.2  kg)  10/19/15 166 lb 12.8 oz (75.7 kg)    Lab Results  Component Value Date   WBC 10.5 03/23/2016   HGB 13.6 03/23/2016  HCT 40.9 03/23/2016   PLT 214 03/23/2016   GLUCOSE 114 (H) 03/23/2016   CHOL 118 05/27/2014   TRIG 92.0 05/27/2014   HDL 33.60 (L) 05/27/2014   LDLCALC 66 05/27/2014   ALT 27 05/27/2014   AST 22 05/27/2014   NA 136 03/23/2016   K 4.7 03/23/2016   CL 105 03/23/2016   CREATININE 1.14 03/23/2016   BUN 18 03/23/2016   CO2 25 03/23/2016   INR 1.0 03/31/2014    EKG  Rate 71  :NSR, normal EKG 10/19/15   Plan:  CAD:  11/15 stenting of OM and LAD positive ETT Last myovue  03/2015 non ischemic and normal EF 63% No longer on DAT 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  Continue current medications and low sodium Dash type diet.  Cozaar stopped due to headache but may be cervical issue as he still has it at night when sleeping   Chol:  f/u labs with primary  Cholesterol is at goal.  Continue current dose of statin and diet Rx.  No myalgias or side effects.  F/U  LFT's in 6 months. Lab Results  Component Value Date   LDLCALC 66 05/27/2014   Carotid no change bruit needs f/u duplex  02/2017  Duplex 03/07/16 less than 50% bilateral disease  ED:  F/u primary indicated not ideal to use Viagra          [name]

## 2016-08-17 ENCOUNTER — Encounter (INDEPENDENT_AMBULATORY_CARE_PROVIDER_SITE_OTHER): Payer: Self-pay

## 2016-08-17 ENCOUNTER — Ambulatory Visit (INDEPENDENT_AMBULATORY_CARE_PROVIDER_SITE_OTHER): Payer: Medicare Other | Admitting: Cardiovascular Disease

## 2016-08-17 ENCOUNTER — Encounter: Payer: Self-pay | Admitting: Cardiovascular Disease

## 2016-08-17 VITALS — BP 130/78 | HR 72 | Ht 68.0 in | Wt 172.8 lb

## 2016-08-17 DIAGNOSIS — I10 Essential (primary) hypertension: Secondary | ICD-10-CM

## 2016-08-17 DIAGNOSIS — R0989 Other specified symptoms and signs involving the circulatory and respiratory systems: Secondary | ICD-10-CM | POA: Diagnosis not present

## 2016-08-17 NOTE — Patient Instructions (Signed)
Medication Instructions:  Your physician recommends that you continue on your current medications as directed. Please refer to the Current Medication list given to you today.  Labwork: NONE  Testing/Procedures: Your physician has requested that you have a carotid duplex in August. This test is an ultrasound of the carotid arteries in your neck. It looks at blood flow through these arteries that supply the brain with blood. Allow one hour for this exam. There are no restrictions or special instructions.   Follow-Up: Your physician wants you to follow-up in: 6 months with Dr. Johnsie Cancel. You will receive a reminder letter in the mail two months in advance. If you don't receive a letter, please call our office to schedule the follow-up appointment.   If you need a refill on your cardiac medications before your next appointment, please call your pharmacy.

## 2016-08-17 NOTE — Addendum Note (Signed)
Addended by: Aris Georgia, Aashir Umholtz L on: 08/17/2016 08:24 AM   Modules accepted: Orders

## 2016-11-04 ENCOUNTER — Other Ambulatory Visit: Payer: Self-pay | Admitting: Cardiovascular Disease

## 2016-11-15 DIAGNOSIS — K219 Gastro-esophageal reflux disease without esophagitis: Secondary | ICD-10-CM | POA: Diagnosis not present

## 2016-11-15 DIAGNOSIS — J411 Mucopurulent chronic bronchitis: Secondary | ICD-10-CM | POA: Diagnosis not present

## 2016-11-15 DIAGNOSIS — I25119 Atherosclerotic heart disease of native coronary artery with unspecified angina pectoris: Secondary | ICD-10-CM | POA: Diagnosis not present

## 2016-11-15 DIAGNOSIS — I10 Essential (primary) hypertension: Secondary | ICD-10-CM | POA: Diagnosis not present

## 2016-11-29 DIAGNOSIS — H11002 Unspecified pterygium of left eye: Secondary | ICD-10-CM | POA: Diagnosis not present

## 2016-11-29 DIAGNOSIS — H2513 Age-related nuclear cataract, bilateral: Secondary | ICD-10-CM | POA: Diagnosis not present

## 2016-11-29 DIAGNOSIS — H25013 Cortical age-related cataract, bilateral: Secondary | ICD-10-CM | POA: Diagnosis not present

## 2017-01-09 ENCOUNTER — Ambulatory Visit (HOSPITAL_COMMUNITY)
Admission: RE | Admit: 2017-01-09 | Discharge: 2017-01-09 | Disposition: A | Discharge status: Home / Self Care | Payer: Medicare Other | Source: Ambulatory Visit | Attending: Cardiovascular Disease | Admitting: Cardiovascular Disease

## 2017-01-09 DIAGNOSIS — R0989 Other specified symptoms and signs involving the circulatory and respiratory systems: Secondary | ICD-10-CM | POA: Diagnosis not present

## 2017-01-09 DIAGNOSIS — I6523 Occlusion and stenosis of bilateral carotid arteries: Secondary | ICD-10-CM | POA: Insufficient documentation

## 2017-01-10 ENCOUNTER — Telehealth: Payer: Self-pay | Admitting: *Deleted

## 2017-01-10 NOTE — Telephone Encounter (Signed)
Called patient with test results. No answer. Unable to leave msg.  

## 2017-01-10 NOTE — Telephone Encounter (Signed)
-----   Message from Josue Hector, MD sent at 01/09/2017  3:55 PM EDT ----- 50-70% Right ICA stenosis f/u duplex in a year

## 2017-01-11 ENCOUNTER — Ambulatory Visit (HOSPITAL_COMMUNITY): Payer: Medicare Other

## 2017-02-02 ENCOUNTER — Encounter: Payer: Self-pay | Admitting: Pulmonary Disease

## 2017-02-02 ENCOUNTER — Other Ambulatory Visit: Payer: Self-pay | Admitting: Cardiovascular Disease

## 2017-02-02 DIAGNOSIS — N411 Chronic prostatitis: Secondary | ICD-10-CM | POA: Diagnosis not present

## 2017-02-02 DIAGNOSIS — J411 Mucopurulent chronic bronchitis: Secondary | ICD-10-CM | POA: Diagnosis not present

## 2017-02-02 DIAGNOSIS — I10 Essential (primary) hypertension: Secondary | ICD-10-CM | POA: Diagnosis not present

## 2017-02-02 DIAGNOSIS — I25119 Atherosclerotic heart disease of native coronary artery with unspecified angina pectoris: Secondary | ICD-10-CM | POA: Diagnosis not present

## 2017-02-03 LAB — HEMOGLOBIN A1C: Hemoglobin A1C: 6

## 2017-02-06 DIAGNOSIS — Z0000 Encounter for general adult medical examination without abnormal findings: Secondary | ICD-10-CM | POA: Diagnosis not present

## 2017-02-06 DIAGNOSIS — Z23 Encounter for immunization: Secondary | ICD-10-CM | POA: Diagnosis not present

## 2017-02-16 LAB — FECAL OCCULT BLOOD, GUAIAC: Fecal Occult Blood: NEGATIVE

## 2017-02-17 DIAGNOSIS — Z1211 Encounter for screening for malignant neoplasm of colon: Secondary | ICD-10-CM | POA: Diagnosis not present

## 2017-02-24 ENCOUNTER — Other Ambulatory Visit: Payer: Self-pay | Admitting: Cardiovascular Disease

## 2017-02-28 DIAGNOSIS — N431 Infected hydrocele: Secondary | ICD-10-CM | POA: Diagnosis not present

## 2017-03-09 NOTE — Progress Notes (Signed)
Patient ID: Brandon [name], male   DOB: 1945/06/16, 71 y.o.   MRN: [id number]           Brandon [name] is a 71 y.o.  admitted to the hospital  04/03/14 with unstable angina. He underwent PTCA and stenting of the mid LAD and OM1. LV function was normal and showed mild inferior basal hypokinesis. He has a history of HTN, 50-60% RICA stenosis.  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    Allergies  Allergen Reactions  . Ciprofloxacin Nausea And Vomiting     Current Outpatient Prescriptions  Medication Sig Dispense Refill  . amLODipine (NORVASC) 10 MG tablet TAKE (1) TABLET BY MOUTH ONCE DAILY. 90 tablet 1  . aspirin EC 81 MG tablet Take 1 tablet (81 mg total) by mouth daily. 90 tablet 3  . atorvastatin (LIPITOR) 80 MG tablet TAKE ONE TABLET BY MOUTH DAILY AT 6 PM. 30 tablet 8  . butalbital-acetaminophen-caffeine (FIORICET, ESGIC) 50-325-40 MG per tablet Take 1 tablet by mouth every 4 (four) hours as needed for headache.    . esomeprazole (NEXIUM) 20 MG capsule Take 1 caplsule by mouth before breakfast    . HYDROcodone-acetaminophen (NORCO/VICODIN) 5-325 MG tablet Take 1 tablet by mouth every 6 (six) hours as needed for moderate pain or severe pain. 6 tablet 0  . NITROSTAT 0.4 MG SL tablet PLACE 1 TABLET UNDER TONGUE FOR CHEST PAIN. MAY REPEAT EVERY 5 MIN UPTO 3 DOSES-NO RELIEF,CALL 911. 25 tablet 3  . ondansetron (ZOFRAN) 4 MG tablet Take 4 mg by mouth every 8 (eight) hours as needed for nausea or vomiting.     No current  facility-administered medications for this visit.     Past Medical History:  Diagnosis Date  . Arthritis   . Blood transfusion    no trouble-51 years ago  . CAD S/P percutaneous coronary angioplasty: DES PCI to mLAD & OM1 04/03/2014   a. Lesion #1: (OM 1 90%) Promus Premier DES 2.25 x 12 mm (2.5 mm)  Lesion #2: (mid LAD 70% ) Promus Premier DES 2.5 x 16 mm  (2.75 mm)   . Cancer (HCC)    Skin  . Chronic headaches   . Chronic prostatitis   . Diverticulitis   . GERD (gastroesophageal reflux disease)   . Hematuria   . Hernia of other specified sites of abdominal cavity without mention of obstruction or gangrene   . Hypertension   . Other specified forms of hearing loss   . PONV (postoperative nausea and vomiting)   . Shortness of breath    with exertion    Past Surgical History:  Procedure Laterality Date  . CHOLECYSTECTOMY     aph-15 yrs ago0-jenkins  . CORONARY STENT PLACEMENT  04/03/2014   DES  OMI  &  LAD  . 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  INGUINAL HERNIA REPAIR  04/13/2011   Procedure: HERNIA REPAIR INGUINAL ADULT;  Surgeon: [name];  Location: [facility];  Service: General;  Laterality: Right;  . LEFT HEART CATHETERIZATION WITH CORONARY ANGIOGRAM N/A 04/03/2014   Procedure: LEFT HEART CATHETERIZATION WITH CORONARY ANGIOGRAM;  Surgeon: [name], MD;  Location: [facility] CATH LAB;  Service: Cardiovascular;  Laterality: N/A;  . PERCUTANEOUS CORONARY STENT INTERVENTION (PCI-S)  04/03/2014   Procedure: PERCUTANEOUS CORONARY STENT INTERVENTION (PCI-S);  Surgeon: [name], MD;  Location: [facility] CATH LAB;  Service: Cardiovascular;;  . RECONSTRUCTION MID-FACE     left face    Family History  Problem Relation Age of Onset  . Heart attack Mother   . Heart disease Mother   . Hypertension Mother   . Heart attack Father   . Heart disease Father   . Hypertension Father     Social  History   Social History  . Marital status: Married    Spouse name: N/A  . Number of children: N/A  . Years of education: N/A   Occupational History  . Not on file.   Social History Main Topics  . Smoking status: Former Smoker    Packs/day: 2.00    Years: 14.00    Types: Cigarettes    Quit date: 04/10/1969  . Smokeless tobacco: Never Used  . Alcohol use No  . Drug use: No  . Sexual activity: Not on file   Other Topics Concern  . Not on file   Social History Narrative  . No narrative on file    ROS: See HPI otherwise negative  BP 118/72   Pulse 78   Ht 5\' 8"  (1.727 m)   Wt 167 lb 8 oz (76 kg)   BMI 25.47 kg/m   Affect appropriate Healthy:  appears stated age 59: normal Neck supple with no adenopathy JVP normal no bruits no thyromegaly Lungs clear with no wheezing and good diaphragmatic motion Heart:  S1/S2 no murmur, no rub, gallop or click PMI normal Abdomen: benighn, BS positve, no tenderness, no AAA no bruit.  No HSM or HJR Distal pulses intact with no bruits No edema Neuro non-focal Skin warm and dry No muscular weakness  Left arm scars broken multiple times copper bracelet     Wt Readings from Last 3 Encounters:  03/13/17 167 lb 8 oz (76 kg)  08/17/16 172 lb 12 oz (78.4 kg)  03/23/16 168 lb (76.2 kg)    Lab Results  Component Value Date   WBC 10.5 03/23/2016   HGB 13.6 03/23/2016   HCT 40.9 03/23/2016   PLT 214 03/23/2016   GLUCOSE 114 (H) 03/23/2016   CHOL 118 05/27/2014   TRIG 92.0 05/27/2014   HDL 33.60 (L) 05/27/2014   LDLCALC 66 05/27/2014   ALT 27 05/27/2014   AST 22 05/27/2014   NA 136 03/23/2016   K 4.7 03/23/2016   CL 105 03/23/2016   CREATININE 1.14 03/23/2016   BUN 18 03/23/2016   CO2 25 03/23/2016   INR 1.0 03/31/2014    EKG  Rate 71  :NSR, normal EKG 10/19/15  03/13/17 NSR normal ECG rate 77   Plan:  CAD:  11/15 stenting of OM and LAD positive ETT Last myovue  03/2015 non ischemic and normal EF 63% No longer  on DAT  HTN:  Well controlled.  Continue current medications and low sodium Dash type diet.  Cozaar stopped due to headache but may be cervical issue as he still has it  at night when sleeping   Chol:  On statin labs with primary   Carotid 80-22% LICA stenosis f/u duplex August 2019   ED:  F/u primary indicated not ideal to use Viagra          [name]

## 2017-03-13 ENCOUNTER — Ambulatory Visit (INDEPENDENT_AMBULATORY_CARE_PROVIDER_SITE_OTHER): Payer: Medicare Other | Admitting: Cardiovascular Disease

## 2017-03-13 ENCOUNTER — Encounter: Payer: Self-pay | Admitting: Cardiovascular Disease

## 2017-03-13 VITALS — BP 118/72 | HR 78 | Ht 68.0 in | Wt 167.5 lb

## 2017-03-13 DIAGNOSIS — I10 Essential (primary) hypertension: Secondary | ICD-10-CM

## 2017-03-13 DIAGNOSIS — I2510 Atherosclerotic heart disease of native coronary artery without angina pectoris: Secondary | ICD-10-CM

## 2017-03-13 NOTE — Patient Instructions (Addendum)

## 2017-03-14 DIAGNOSIS — I2510 Atherosclerotic heart disease of native coronary artery without angina pectoris: Secondary | ICD-10-CM | POA: Diagnosis not present

## 2017-03-14 DIAGNOSIS — K210 Gastro-esophageal reflux disease with esophagitis: Secondary | ICD-10-CM | POA: Diagnosis not present

## 2017-03-14 DIAGNOSIS — N390 Urinary tract infection, site not specified: Secondary | ICD-10-CM | POA: Diagnosis not present

## 2017-03-14 DIAGNOSIS — N419 Inflammatory disease of prostate, unspecified: Secondary | ICD-10-CM | POA: Diagnosis not present

## 2017-03-14 DIAGNOSIS — I10 Essential (primary) hypertension: Secondary | ICD-10-CM | POA: Diagnosis not present

## 2017-03-16 DIAGNOSIS — I25119 Atherosclerotic heart disease of native coronary artery with unspecified angina pectoris: Secondary | ICD-10-CM | POA: Diagnosis not present

## 2017-03-16 DIAGNOSIS — I10 Essential (primary) hypertension: Secondary | ICD-10-CM | POA: Diagnosis not present

## 2017-03-16 DIAGNOSIS — N411 Chronic prostatitis: Secondary | ICD-10-CM | POA: Diagnosis not present

## 2017-03-16 DIAGNOSIS — N452 Orchitis: Secondary | ICD-10-CM | POA: Diagnosis not present

## 2017-03-17 DIAGNOSIS — N411 Chronic prostatitis: Secondary | ICD-10-CM | POA: Diagnosis not present

## 2017-03-20 ENCOUNTER — Other Ambulatory Visit: Payer: Self-pay | Admitting: *Deleted

## 2017-03-20 DIAGNOSIS — I6523 Occlusion and stenosis of bilateral carotid arteries: Secondary | ICD-10-CM

## 2017-04-24 DIAGNOSIS — R3915 Urgency of urination: Secondary | ICD-10-CM | POA: Diagnosis not present

## 2017-04-24 DIAGNOSIS — R972 Elevated prostate specific antigen [PSA]: Secondary | ICD-10-CM | POA: Diagnosis not present

## 2017-05-11 ENCOUNTER — Other Ambulatory Visit: Payer: Self-pay | Admitting: Cardiovascular Disease

## 2017-07-24 DIAGNOSIS — R972 Elevated prostate specific antigen [PSA]: Secondary | ICD-10-CM | POA: Diagnosis not present

## 2017-08-11 ENCOUNTER — Other Ambulatory Visit: Payer: Self-pay | Admitting: Cardiovascular Disease

## 2017-08-17 ENCOUNTER — Telehealth: Payer: Self-pay | Admitting: Cardiovascular Disease

## 2017-08-17 NOTE — Telephone Encounter (Signed)
close

## 2017-08-28 DIAGNOSIS — K219 Gastro-esophageal reflux disease without esophagitis: Secondary | ICD-10-CM | POA: Diagnosis not present

## 2017-08-28 DIAGNOSIS — J411 Mucopurulent chronic bronchitis: Secondary | ICD-10-CM | POA: Diagnosis not present

## 2017-08-28 DIAGNOSIS — I10 Essential (primary) hypertension: Secondary | ICD-10-CM | POA: Diagnosis not present

## 2017-08-28 DIAGNOSIS — I25119 Atherosclerotic heart disease of native coronary artery with unspecified angina pectoris: Secondary | ICD-10-CM | POA: Diagnosis not present

## 2017-09-13 ENCOUNTER — Other Ambulatory Visit: Payer: Self-pay | Admitting: Cardiovascular Disease

## 2017-10-10 DIAGNOSIS — R3915 Urgency of urination: Secondary | ICD-10-CM | POA: Diagnosis not present

## 2017-10-10 DIAGNOSIS — R972 Elevated prostate specific antigen [PSA]: Secondary | ICD-10-CM | POA: Diagnosis not present

## 2017-10-10 DIAGNOSIS — R8271 Bacteriuria: Secondary | ICD-10-CM | POA: Diagnosis not present

## 2017-10-10 DIAGNOSIS — N5201 Erectile dysfunction due to arterial insufficiency: Secondary | ICD-10-CM | POA: Diagnosis not present

## 2017-10-10 DIAGNOSIS — Z125 Encounter for screening for malignant neoplasm of prostate: Secondary | ICD-10-CM | POA: Diagnosis not present

## 2017-11-27 DIAGNOSIS — J411 Mucopurulent chronic bronchitis: Secondary | ICD-10-CM | POA: Diagnosis not present

## 2017-11-27 DIAGNOSIS — I25119 Atherosclerotic heart disease of native coronary artery with unspecified angina pectoris: Secondary | ICD-10-CM | POA: Diagnosis not present

## 2017-11-27 DIAGNOSIS — I10 Essential (primary) hypertension: Secondary | ICD-10-CM | POA: Diagnosis not present

## 2017-11-27 DIAGNOSIS — K219 Gastro-esophageal reflux disease without esophagitis: Secondary | ICD-10-CM | POA: Diagnosis not present

## 2017-11-28 DIAGNOSIS — N3020 Other chronic cystitis without hematuria: Secondary | ICD-10-CM | POA: Diagnosis not present

## 2017-12-12 DIAGNOSIS — H25013 Cortical age-related cataract, bilateral: Secondary | ICD-10-CM | POA: Diagnosis not present

## 2017-12-12 DIAGNOSIS — H11002 Unspecified pterygium of left eye: Secondary | ICD-10-CM | POA: Diagnosis not present

## 2017-12-12 DIAGNOSIS — H2513 Age-related nuclear cataract, bilateral: Secondary | ICD-10-CM | POA: Diagnosis not present

## 2017-12-20 DIAGNOSIS — H2512 Age-related nuclear cataract, left eye: Secondary | ICD-10-CM | POA: Diagnosis not present

## 2017-12-20 DIAGNOSIS — H21561 Pupillary abnormality, right eye: Secondary | ICD-10-CM | POA: Diagnosis not present

## 2017-12-20 DIAGNOSIS — H25011 Cortical age-related cataract, right eye: Secondary | ICD-10-CM | POA: Diagnosis not present

## 2017-12-20 DIAGNOSIS — H25012 Cortical age-related cataract, left eye: Secondary | ICD-10-CM | POA: Diagnosis not present

## 2017-12-20 DIAGNOSIS — H2511 Age-related nuclear cataract, right eye: Secondary | ICD-10-CM | POA: Diagnosis not present

## 2017-12-20 DIAGNOSIS — H5703 Miosis: Secondary | ICD-10-CM | POA: Diagnosis not present

## 2017-12-29 ENCOUNTER — Other Ambulatory Visit: Payer: Self-pay

## 2018-01-01 ENCOUNTER — Telehealth: Payer: Self-pay | Admitting: Cardiovascular Disease

## 2018-01-01 NOTE — Telephone Encounter (Signed)
Patient's daughter calling and complaining about patient having 2 to 3 + pitting edema in BLE with pain, SOB with activity, dizziness, lightheadedness, and headaches. Patient's daughter stated this has been going on for months, and patient has been hiding these symptoms and brushing them off. Made patient an appointment to see Dr. Johnsie Cancel on Thursday. Patient's daughter stated she would make sure he made the appointment. Will forward to Dr. Johnsie Cancel for advisement.

## 2018-01-01 NOTE — Telephone Encounter (Signed)
New message      Pt c/o Shortness Of Breath: STAT if SOB developed within the last 24 hours or pt is noticeably SOB on the phone  1. Are you currently SOB (can you hear that pt is SOB on the phone)?  Daughter came to office to schedule appt. Pt cannot come wed because of cataract surgery.  Daughter states pt will not see APP but want to talk to a nurse 2. How long have you been experiencing SOB?  Daughter says for months but sob is worse now 3. Are you SOB when sitting or when up moving around?  Worse when he moves around 4. Are you currently experiencing any other symptoms?  Swelling in legs and feet

## 2018-01-03 DIAGNOSIS — H25012 Cortical age-related cataract, left eye: Secondary | ICD-10-CM | POA: Diagnosis not present

## 2018-01-03 DIAGNOSIS — H2512 Age-related nuclear cataract, left eye: Secondary | ICD-10-CM | POA: Diagnosis not present

## 2018-01-03 NOTE — Progress Notes (Signed)
Patient ID: Brandon [name], male   DOB: 07-16-1945, 72 y.o.   MRN: [id number]           HPI: 72 y.o. seen in hospital 2015 with unstable angina and had stenting of mid LAD and OM1. EF was Normal. CRF;s HTN. 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 She indicated They had been going on for months but patient down played them  However he looks fine in clinic. 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   Compliant with meds   Has diplopia and very upset that Dr Sabra Heck told her she could no longer drive     Allergies  Allergen Reactions  . Ciprofloxacin Nausea And Vomiting     Current Outpatient Medications  Medication Sig Dispense Refill  . amLODipine (NORVASC) 10 MG tablet TAKE (1) TABLET BY MOUTH ONCE DAILY. 90 tablet 1  . atorvastatin (LIPITOR) 80 MG tablet TAKE ONE TABLET BY MOUTH DAILY AT 6 PM. 30 tablet 6  . butalbital-acetaminophen-caffeine (FIORICET, ESGIC) 50-325-40 MG per tablet Take 1 tablet by mouth every 4 (four) hours as needed for headache.    . esomeprazole (NEXIUM) 20 MG capsule Take 1 caplsule by mouth before breakfast    . HYDROcodone-acetaminophen (NORCO/VICODIN) 5-325 MG tablet Take 1 tablet by mouth every 6 (six) hours as needed for moderate pain or severe pain. 6 tablet 0  . NITROSTAT 0.4 MG SL tablet PLACE 1 TABLET UNDER TONGUE FOR CHEST PAIN. MAY REPEAT EVERY 5 MIN UPTO 3 DOSES-NO RELIEF,CALL 911. 25 tablet 3  . ondansetron (ZOFRAN) 4 MG tablet Take 4 mg by mouth every 8 (eight) hours as needed for nausea or vomiting.     No current facility-administered  medications for this visit.     Past Medical History:  Diagnosis Date  . Arthritis   . Blood transfusion    no trouble-51 years ago  . CAD S/P percutaneous coronary angioplasty: DES PCI to mLAD & OM1 04/03/2014   a. Lesion #1: (OM 1 90%) Promus Premier DES 2.25 x 12 mm (2.5 mm)  Lesion #2: (mid LAD 70% ) Promus Premier DES 2.5 x 16 mm  (2.75 mm)   . Cancer (HCC)    Skin  . Chronic headaches   . Chronic prostatitis   . Diverticulitis   . GERD (gastroesophageal reflux disease)   . Hematuria   . Hernia of other specified sites of abdominal cavity without mention of obstruction or gangrene   . Hypertension   . Other specified forms of hearing loss   . PONV (postoperative nausea and vomiting)   . Shortness of breath    with exertion    Past Surgical History:  Procedure Laterality Date  . CHOLECYSTECTOMY     aph-15 yrs ago0-jenkins  . CORONARY STENT PLACEMENT  04/03/2014   DES  OMI  &  LAD  . 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 INGUINAL HERNIA REPAIR  04/13/2011  Procedure: HERNIA REPAIR INGUINAL ADULT;  Surgeon: [name];  Location: [facility];  Service: General;  Laterality: Right;  . LEFT HEART CATHETERIZATION WITH CORONARY ANGIOGRAM N/A 04/03/2014   Procedure: LEFT HEART CATHETERIZATION WITH CORONARY ANGIOGRAM;  Surgeon: [name], MD;  Location: [facility] CATH LAB;  Service: Cardiovascular;  Laterality: N/A;  . PERCUTANEOUS CORONARY STENT INTERVENTION (PCI-S)  04/03/2014   Procedure: PERCUTANEOUS CORONARY STENT INTERVENTION (PCI-S);  Surgeon: [name], MD;  Location: [facility] CATH LAB;  Service: Cardiovascular;;  . RECONSTRUCTION MID-FACE     left face    Family History  Problem Relation Age of Onset  . Heart attack Mother   . Heart disease Mother   . Hypertension Mother   . Heart attack Father   . Heart disease Father   . Hypertension Father     Social History   Socioeconomic  History  . Marital status: Married    Spouse name: Not on file  . Number of children: Not on file  . Years of education: Not on file  . Highest education level: Not on file  Occupational History  . Not on file  Social Needs  . Financial resource strain: Not on file  . Food insecurity:    Worry: Not on file    Inability: Not on file  . Transportation needs:    Medical: Not on file    Non-medical: Not on file  Tobacco Use  . Smoking status: Former Smoker    Packs/day: 2.00    Years: 14.00    Pack years: 28.00    Types: Cigarettes    Last attempt to quit: 04/10/1969    Years since quitting: 48.7  . Smokeless tobacco: Never Used  Substance and Sexual Activity  . Alcohol use: No  . Drug use: No  . Sexual activity: Not on file  Lifestyle  . Physical activity:    Days per week: Not on file    Minutes per session: Not on file  . Stress: Not on file  Relationships  . Social connections:    Talks on phone: Not on file    Gets together: Not on file    Attends religious service: Not on file    Active member of club or organization: Not on file    Attends meetings of clubs or organizations: Not on file    Relationship status: Not on file  . Intimate partner violence:    Fear of current or ex partner: Not on file    Emotionally abused: Not on file    Physically abused: Not on file    Forced sexual activity: Not on file  Other Topics Concern  . Not on file  Social History Narrative  . Not on file    ROS: See HPI otherwise negative  BP 100/66   Pulse 81   Ht 5\' 8"  (1.727 m)   Wt 170 lb (77.1 kg)   SpO2 96%   BMI 25.85 kg/m   Affect appropriate Anxious thin male  HEENT: normal Neck supple with no adenopathy JVP normal no bruits no thyromegaly Lungs clear with no wheezing and good diaphragmatic motion Heart:  S1/S2 SEM murmur, no rub, gallop or click PMI normal Abdomen: benighn, BS positve, no tenderness, no AAA no bruit.  No HSM or HJR Distal pulses intact  with no bruits No edema Neuro non-focal Skin warm and dry No muscular weakness  Left arm scars broken multiple times copper bracelet     Wt Readings from  Last 3 Encounters:  01/04/18 170 lb (77.1 kg)  03/13/17 167 lb 8 oz (76 kg)  08/17/16 172 lb 12 oz (78.4 kg)    Lab Results  Component Value Date   WBC 10.5 03/23/2016   HGB 13.6 03/23/2016   HCT 40.9 03/23/2016   PLT 214 03/23/2016   GLUCOSE 114 (H) 03/23/2016   CHOL 118 05/27/2014   TRIG 92.0 05/27/2014   HDL 33.60 (L) 05/27/2014   LDLCALC 66 05/27/2014   ALT 27 05/27/2014   AST 22 05/27/2014   NA 136 03/23/2016   K 4.7 03/23/2016   CL 105 03/23/2016   CREATININE 1.14 03/23/2016   BUN 18 03/23/2016   CO2 25 03/23/2016   INR 1.0 03/31/2014    EKG  Rate 71  :NSR, normal EKG 10/19/15  03/13/17 NSR normal ECG rate 77   Plan:  CAD:  11/15 stenting of OM and LAD positive ETT Last myovue  03/2015 non ischemic and normal EF 63% No longer on DAT 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  Continue current medications and low sodium Dash type diet.  Cozaar stopped due to headache but may be cervical issue as he still has it at night when sleeping   Chol:  On statin labs with primary   Carotid 94-80% LICA stenosis f/u duplex ordered   Dyspnea/Edema:  : Normal exam with no edema f/u echo to assess RV/LV function   Diplopia- encouraged her to get a 2nd opinion about her vision and see an ophthalmologist who has Experience with prisms  :           [name]

## 2018-01-04 ENCOUNTER — Encounter: Payer: Self-pay | Admitting: Cardiovascular Disease

## 2018-01-04 ENCOUNTER — Ambulatory Visit (INDEPENDENT_AMBULATORY_CARE_PROVIDER_SITE_OTHER): Payer: Medicare Other | Admitting: Cardiovascular Disease

## 2018-01-04 VITALS — BP 100/66 | HR 81 | Ht 68.0 in | Wt 170.0 lb

## 2018-01-04 DIAGNOSIS — R0989 Other specified symptoms and signs involving the circulatory and respiratory systems: Secondary | ICD-10-CM

## 2018-01-04 DIAGNOSIS — I25118 Atherosclerotic heart disease of native coronary artery with other forms of angina pectoris: Secondary | ICD-10-CM

## 2018-01-04 DIAGNOSIS — I25119 Atherosclerotic heart disease of native coronary artery with unspecified angina pectoris: Secondary | ICD-10-CM | POA: Diagnosis not present

## 2018-01-04 DIAGNOSIS — I10 Essential (primary) hypertension: Secondary | ICD-10-CM | POA: Diagnosis not present

## 2018-01-04 DIAGNOSIS — N411 Chronic prostatitis: Secondary | ICD-10-CM | POA: Diagnosis not present

## 2018-01-04 DIAGNOSIS — M5432 Sciatica, left side: Secondary | ICD-10-CM | POA: Diagnosis not present

## 2018-01-04 NOTE — Patient Instructions (Addendum)
Medication Instructions:  Your physician recommends that you continue on your current medications as directed. Please refer to the Current Medication list given to you today.  Labwork: NONE  Testing/Procedures: Your physician has requested that you have a lexiscan myoview. For further information please visit [email or web address]. Please follow instruction sheet, as given.  Your physician has requested that you have a carotid duplex. This test is an ultrasound of the carotid arteries in your neck. It looks at blood flow through these arteries that supply the brain with blood. Allow one hour for this exam. There are no restrictions or special instructions.  Your physician has requested that you have an echocardiogram. Echocardiography is a painless test that uses sound waves to create images of your heart. It provides your doctor with information about the size and shape of your heart and how well your heart's chambers and valves are working. This procedure takes approximately one hour. There are no restrictions for this procedure.  Follow-Up: Your physician wants you to follow-up in: 6 months with Dr. Johnsie Cancel. You will receive a reminder letter in the mail two months in advance. If you don't receive a letter, please call our office to schedule the follow-up appointment.   If you need a refill on your cardiac medications before your next appointment, please call your pharmacy.

## 2018-01-05 ENCOUNTER — Emergency Department (HOSPITAL_COMMUNITY): Payer: Medicare Other

## 2018-01-05 ENCOUNTER — Other Ambulatory Visit: Payer: Self-pay

## 2018-01-05 ENCOUNTER — Encounter (HOSPITAL_COMMUNITY): Payer: Self-pay | Admitting: Emergency Medicine

## 2018-01-05 ENCOUNTER — Emergency Department (HOSPITAL_COMMUNITY)
Admission: EM | Admit: 2018-01-05 | Discharge: 2018-01-05 | Disposition: A | Discharge status: Home / Self Care | Payer: Medicare Other | Attending: Emergency Medicine | Admitting: Emergency Medicine

## 2018-01-05 DIAGNOSIS — S32038A Other fracture of third lumbar vertebra, initial encounter for closed fracture: Secondary | ICD-10-CM | POA: Diagnosis not present

## 2018-01-05 DIAGNOSIS — S40811A Abrasion of right upper arm, initial encounter: Secondary | ICD-10-CM | POA: Diagnosis not present

## 2018-01-05 DIAGNOSIS — Z87891 Personal history of nicotine dependence: Secondary | ICD-10-CM | POA: Insufficient documentation

## 2018-01-05 DIAGNOSIS — I25110 Atherosclerotic heart disease of native coronary artery with unstable angina pectoris: Secondary | ICD-10-CM | POA: Insufficient documentation

## 2018-01-05 DIAGNOSIS — W1789XA Other fall from one level to another, initial encounter: Secondary | ICD-10-CM | POA: Insufficient documentation

## 2018-01-05 DIAGNOSIS — S32028A Other fracture of second lumbar vertebra, initial encounter for closed fracture: Secondary | ICD-10-CM | POA: Diagnosis not present

## 2018-01-05 DIAGNOSIS — Y999 Unspecified external cause status: Secondary | ICD-10-CM | POA: Insufficient documentation

## 2018-01-05 DIAGNOSIS — Z955 Presence of coronary angioplasty implant and graft: Secondary | ICD-10-CM | POA: Insufficient documentation

## 2018-01-05 DIAGNOSIS — Y939 Activity, unspecified: Secondary | ICD-10-CM | POA: Insufficient documentation

## 2018-01-05 DIAGNOSIS — T07XXXA Unspecified multiple injuries, initial encounter: Secondary | ICD-10-CM | POA: Insufficient documentation

## 2018-01-05 DIAGNOSIS — Y929 Unspecified place or not applicable: Secondary | ICD-10-CM | POA: Insufficient documentation

## 2018-01-05 DIAGNOSIS — I10 Essential (primary) hypertension: Secondary | ICD-10-CM | POA: Diagnosis not present

## 2018-01-05 DIAGNOSIS — N390 Urinary tract infection, site not specified: Secondary | ICD-10-CM | POA: Insufficient documentation

## 2018-01-05 DIAGNOSIS — S32009A Unspecified fracture of unspecified lumbar vertebra, initial encounter for closed fracture: Secondary | ICD-10-CM | POA: Insufficient documentation

## 2018-01-05 DIAGNOSIS — R109 Unspecified abdominal pain: Secondary | ICD-10-CM | POA: Insufficient documentation

## 2018-01-05 DIAGNOSIS — Z79899 Other long term (current) drug therapy: Secondary | ICD-10-CM | POA: Diagnosis not present

## 2018-01-05 DIAGNOSIS — W19XXXA Unspecified fall, initial encounter: Secondary | ICD-10-CM

## 2018-01-05 DIAGNOSIS — S3991XA Unspecified injury of abdomen, initial encounter: Secondary | ICD-10-CM | POA: Diagnosis not present

## 2018-01-05 DIAGNOSIS — S79912A Unspecified injury of left hip, initial encounter: Secondary | ICD-10-CM | POA: Diagnosis not present

## 2018-01-05 DIAGNOSIS — M25551 Pain in right hip: Secondary | ICD-10-CM | POA: Diagnosis not present

## 2018-01-05 DIAGNOSIS — S2231XA Fracture of one rib, right side, initial encounter for closed fracture: Secondary | ICD-10-CM | POA: Diagnosis not present

## 2018-01-05 DIAGNOSIS — S299XXA Unspecified injury of thorax, initial encounter: Secondary | ICD-10-CM | POA: Diagnosis not present

## 2018-01-05 DIAGNOSIS — R0781 Pleurodynia: Secondary | ICD-10-CM | POA: Diagnosis not present

## 2018-01-05 LAB — COMPREHENSIVE METABOLIC PANEL
ALBUMIN: 4.3 g/dL (ref 3.5–5.0)
ALT: 31 U/L (ref 0–44)
ANION GAP: 9 (ref 5–15)
AST: 29 U/L (ref 15–41)
Alkaline Phosphatase: 88 U/L (ref 38–126)
BUN: 26 mg/dL — ABNORMAL HIGH (ref 8–23)
CO2: 23 mmol/L (ref 22–32)
Calcium: 9.7 mg/dL (ref 8.9–10.3)
Chloride: 107 mmol/L (ref 98–111)
Creatinine, Ser: 1.17 mg/dL (ref 0.61–1.24)
GFR calc Af Amer: >60 mL/min (ref >60)
GFR calc non Af Amer: >60 mL/min (ref >60)
GLUCOSE: 199 mg/dL — AB (ref 70–99)
POTASSIUM: 4.3 mmol/L (ref 3.5–5.1)
SODIUM: 139 mmol/L (ref 135–145)
TOTAL PROTEIN: 8.5 g/dL — AB (ref 6.5–8.1)
Total Bilirubin: 0.8 mg/dL (ref 0.3–1.2)

## 2018-01-05 LAB — URINALYSIS, ROUTINE W REFLEX MICROSCOPIC
BILIRUBIN URINE: NEGATIVE
Glucose, UA: 50 mg/dL — AB
Hgb urine dipstick: NEGATIVE
KETONES UR: NEGATIVE mg/dL
Nitrite: POSITIVE — AB
PROTEIN: NEGATIVE mg/dL
pH: 5 (ref 5.0–8.0)

## 2018-01-05 LAB — CBC WITH DIFFERENTIAL/PLATELET
BASOS ABS: 0 10*3/uL (ref 0.0–0.1)
Basophils Relative: 0 %
Eosinophils Absolute: 0 10*3/uL (ref 0.0–0.7)
Eosinophils Relative: 0 %
HEMATOCRIT: 42 % (ref 39.0–52.0)
HEMOGLOBIN: 14.1 g/dL (ref 13.0–17.0)
LYMPHS PCT: 11 %
Lymphs Abs: 2.1 10*3/uL (ref 0.7–4.0)
MCH: 30.1 pg (ref 26.0–34.0)
MCHC: 33.6 g/dL (ref 30.0–36.0)
MCV: 89.7 fL (ref 78.0–100.0)
MONO ABS: 1 10*3/uL (ref 0.1–1.0)
MONOS PCT: 5 %
NEUTROS ABS: 15.4 10*3/uL — AB (ref 1.7–7.7)
Neutrophils Relative %: 84 %
Platelets: 230 10*3/uL (ref 150–400)
RBC: 4.68 MIL/uL (ref 4.22–5.81)
RDW: 12.7 % (ref 11.5–15.5)
WBC: 18.4 10*3/uL — ABNORMAL HIGH (ref 4.0–10.5)

## 2018-01-05 MED ORDER — SODIUM CHLORIDE 0.9 % IV BOLUS (SEPSIS)
500.0000 mL | Freq: Once | INTRAVENOUS | Status: AC
  Administered 2018-01-05: 500 mL via INTRAVENOUS

## 2018-01-05 MED ORDER — MORPHINE SULFATE (PF) 4 MG/ML IV SOLN
4.0000 mg | Freq: Once | INTRAVENOUS | Status: AC
  Administered 2018-01-05: 4 mg via INTRAVENOUS
  Filled 2018-01-05: qty 1

## 2018-01-05 MED ORDER — SODIUM CHLORIDE 0.9 % IV SOLN
1000.0000 mL | INTRAVENOUS | Status: DC
  Administered 2018-01-05: 1000 mL via INTRAVENOUS

## 2018-01-05 MED ORDER — BACITRACIN-NEOMYCIN-POLYMYXIN 400-5-5000 EX OINT
TOPICAL_OINTMENT | CUTANEOUS | Status: AC
  Administered 2018-01-05: 1 via TOPICAL
  Filled 2018-01-05: qty 1

## 2018-01-05 MED ORDER — LORAZEPAM 2 MG/ML IJ SOLN
0.5000 mg | Freq: Once | INTRAMUSCULAR | Status: AC
  Administered 2018-01-05: 0.5 mg via INTRAVENOUS
  Filled 2018-01-05: qty 1

## 2018-01-05 MED ORDER — ONDANSETRON HCL 4 MG/2ML IJ SOLN
4.0000 mg | Freq: Once | INTRAMUSCULAR | Status: AC
  Administered 2018-01-05: 4 mg via INTRAVENOUS
  Filled 2018-01-05: qty 2

## 2018-01-05 MED ORDER — IOPAMIDOL (ISOVUE-300) INJECTION 61%
100.0000 mL | Freq: Once | INTRAVENOUS | Status: AC | PRN
  Administered 2018-01-05: 100 mL via INTRAVENOUS

## 2018-01-05 MED ORDER — CEPHALEXIN 500 MG PO CAPS: 500.0000 mg | ORAL_CAPSULE | Freq: Four times a day (QID) | ORAL | 0 refills | Status: DC

## 2018-01-05 MED ORDER — METHOCARBAMOL 500 MG PO TABS: 500.0000 mg | ORAL_TABLET | Freq: Three times a day (TID) | ORAL | 0 refills | Status: DC

## 2018-01-05 MED ORDER — BACITRACIN-NEOMYCIN-POLYMYXIN 400-5-5000 EX OINT
TOPICAL_OINTMENT | Freq: Once | CUTANEOUS | Status: AC
  Administered 2018-01-05: 1 via TOPICAL

## 2018-01-05 MED ORDER — CEPHALEXIN 500 MG PO CAPS
500.0000 mg | ORAL_CAPSULE | Freq: Once | ORAL | Status: AC
  Administered 2018-01-05: 500 mg via ORAL
  Filled 2018-01-05: qty 1

## 2018-01-05 MED ORDER — FENTANYL CITRATE (PF) 100 MCG/2ML IJ SOLN
50.0000 ug | Freq: Once | INTRAMUSCULAR | Status: AC
  Administered 2018-01-05: 50 ug via INTRAVENOUS
  Filled 2018-01-05: qty 2

## 2018-01-05 MED ORDER — HYDROCODONE-ACETAMINOPHEN 5-325 MG PO TABS: 1.0000 | ORAL_TABLET | ORAL | 0 refills | Status: DC | PRN

## 2018-01-05 NOTE — ED Triage Notes (Signed)
Patient states he fell off a log cutting machine approximately 6 feet and is hurting in his right rib cage and right hip. Patient is ambulatory with pain.

## 2018-01-05 NOTE — ED Provider Notes (Signed)
[facility] EMERGENCY DEPARTMENT Provider Note   CSN: [id number] Arrival date & time: 01/05/18  1506     History   Chief Complaint Chief Complaint  Patient presents with  . Fall    HPI Brandon [name] is a 72 y.o. male.  Patient is a 72 year old male who presents to the emergency department following a fall.  The patient states that he was on a knuckle bone that was about 6 feet in the air.  He lost his balance, and fell between some railings.  He complains of right rib/flank area pain, and right back pain.  His family reports that he was able to walk from the area of the wound to the truck.  The patient is been very reluctant to do any moving after being placed in the wheelchair here in the hospital because of pain.  He denies unusual shortness of breath, and he denies hemoptysis. The patient denies hitting his head, and he denies hurting his neck.  The history is provided by the patient.  Fall  Pertinent negatives include no chest pain, no abdominal pain and no shortness of breath.    Past Medical History:  Diagnosis Date  . Arthritis   . Blood transfusion    no trouble-51 years ago  . CAD S/P percutaneous coronary angioplasty: DES PCI to mLAD & OM1 04/03/2014   a. Lesion #1: (OM 1 90%) Promus Premier DES 2.25 x 12 mm (2.5 mm)  Lesion #2: (mid LAD 70% ) Promus Premier DES 2.5 x 16 mm  (2.75 mm)   . Cancer (HCC)    Skin  . Chronic headaches   . Chronic prostatitis   . Diverticulitis   . GERD (gastroesophageal reflux disease)   . Hematuria   . Hernia of other specified sites of abdominal cavity without mention of obstruction or gangrene   . Hypertension   . Other specified forms of hearing loss   . PONV (postoperative nausea and vomiting)   . Shortness of breath    with exertion    Patient Active Problem List   Diagnosis Date Noted  . Hyperlipidemia 04/14/2014  . Unstable angina ([place]) 04/03/2014  . CAD S/P percutaneous coronary angioplasty: DES PCI to mLAD & OM1  04/03/2014    Class: Status post  . Right carotid bruit 11/14/2012  . HTN (hypertension) 08/02/2012  . Chest pain 06/21/2012  . Fatigue 06/21/2012  . Dyspnea 06/21/2012    Past Surgical History:  Procedure Laterality Date  . CHOLECYSTECTOMY     aph-15 yrs ago0-jenkins  . CORONARY STENT PLACEMENT  04/03/2014   DES  OMI  &  LAD  . 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 INGUINAL HERNIA REPAIR  04/13/2011   Procedure: HERNIA REPAIR INGUINAL ADULT;  Surgeon: [name];  Location: [facility];  Service: General;  Laterality: Right;  . LEFT HEART CATHETERIZATION WITH CORONARY ANGIOGRAM N/A 04/03/2014   Procedure: LEFT HEART CATHETERIZATION WITH CORONARY ANGIOGRAM;  Surgeon: [name], MD;  Location: [facility] CATH LAB;  Service: Cardiovascular;  Laterality: N/A;  . PERCUTANEOUS CORONARY STENT INTERVENTION (PCI-S)  04/03/2014   Procedure: PERCUTANEOUS CORONARY STENT INTERVENTION (PCI-S);  Surgeon: [name], MD;  Location: [facility] CATH LAB;  Service: Cardiovascular;;  . RECONSTRUCTION MID-FACE     left face        Home Medications    Prior to Admission  medications   Medication Sig Start Date End Date Taking? Authorizing Provider  amLODipine (NORVASC) 10 MG tablet TAKE (1) TABLET BY MOUTH ONCE DAILY. 08/11/17   Josue Hector, MD  atorvastatin (LIPITOR) 80 MG tablet TAKE ONE TABLET BY MOUTH DAILY AT 6 PM. 09/13/17   Josue Hector, MD  butalbital-acetaminophen-caffeine (FIORICET, ESGIC) 50-325-40 MG per tablet Take 1 tablet by mouth every 4 (four) hours as needed for headache.    [provider]  esomeprazole (NEXIUM) 20 MG capsule Take 1 caplsule by mouth before breakfast 04/09/14   Josue Hector, MD  HYDROcodone-acetaminophen (NORCO/VICODIN) 5-325 MG tablet Take 1 tablet by mouth every 6 (six) hours as needed for moderate pain or severe pain. 03/23/16   Forde Dandy, MD  NITROSTAT 0.4 MG SL  tablet PLACE 1 TABLET UNDER TONGUE FOR CHEST PAIN. MAY REPEAT EVERY 5 MIN UPTO 3 DOSES-NO RELIEF,CALL 911. 02/24/17   Josue Hector, MD  ondansetron (ZOFRAN) 4 MG tablet Take 4 mg by mouth every 8 (eight) hours as needed for nausea or vomiting.    [provider]    Family History Family History  Problem Relation Age of Onset  . Heart attack Mother   . Heart disease Mother   . Hypertension Mother   . Heart attack Father   . Heart disease Father   . Hypertension Father     Social History Social History   Tobacco Use  . Smoking status: Former Smoker    Packs/day: 2.00    Years: 14.00    Pack years: 28.00    Types: Cigarettes    Last attempt to quit: 04/10/1969    Years since quitting: 48.7  . Smokeless tobacco: Never Used  Substance Use Topics  . Alcohol use: No  . Drug use: No     Allergies   Ciprofloxacin   Review of Systems Review of Systems  Constitutional: Negative for activity change.       All ROS Neg except as noted in HPI  HENT: Negative for nosebleeds.   Eyes: Negative for photophobia and discharge.  Respiratory: Negative for cough, shortness of breath and wheezing.        Chest wall pain  Cardiovascular: Negative for chest pain and palpitations.  Gastrointestinal: Negative for abdominal pain and blood in stool.  Genitourinary: Positive for flank pain. Negative for dysuria, frequency and hematuria.  Musculoskeletal: Positive for back pain. Negative for arthralgias and neck pain.  Skin: Negative.   Neurological: Negative for dizziness, seizures and speech difficulty.  Psychiatric/Behavioral: Negative for confusion and hallucinations.     Physical Exam Updated Vital Signs BP 119/74 (BP Location: Right Arm)   Pulse 84   Temp 98.2 F (36.8 C) (Oral)   Resp 20   Ht 5\' 8"  (1.727 m)   Wt 77.1 kg   SpO2 96%   BMI 25.85 kg/m   Physical Exam  Pulmonary/Chest:  There is symmetrical rise and fall of the chest.  The patient speaks in complete  sentences without problem.  The patient has pain on the right lower posterior chest with deep breathing.  There is a large hematoma of the right lower posterior flank/back area.  There is a large abrasion from the belt line up to the lower rib area.  No bleeding from the abrasion.  No crepitus appreciated.  Abdominal:  Abdomen is soft with good bowel sounds.  There is no hepatomegaly or splenomegaly appreciated on palpation exam.    Musculoskeletal:  There is  pain of the lower lumbar spine and paraspinal tenderness on the right.  No palpable step-off appreciated of the cervical, thoracic, or lumbar area.  There is good range of motion of the right and left hip.  There is pain with attempted range of motion of the right hip.  There is full range of motion of right and left knee, and there is full range of motion of right and left ankle.  There is a abrasion noted of the posterior right humerus area, as well as the right wrist.  Bleeding is controlled.  Dressing has been applied.     ED Treatments / Results  Labs (all labs ordered are listed, but only abnormal results are displayed) Labs Reviewed - No data to display  EKG None  Radiology Dg Ribs Unilateral W/chest Right  Result Date: 01/05/2018 CLINICAL DATA:  Pain after fall. EXAM: RIGHT RIBS AND CHEST - 3+ VIEW COMPARISON:  March 23, 2016 FINDINGS: Haziness over the lateral left lung base may represent scarring. The heart, hila, and mediastinum are unremarkable. No pneumothorax. No nodule or mass. No other infiltrate. No rib fractures noted. IMPRESSION: No rib fractures noted.  No acute abnormality. Electronically Signed   By: Dorise Bullion III M.D   On: 01/05/2018 16:37   Dg Hip Unilat W Or Wo Pelvis 2-3 Views Right  Result Date: 01/05/2018 CLINICAL DATA:  Lateral right hip pain following a 6 ft fall. EXAM: DG HIP (WITH OR WITHOUT PELVIS) 2-3V RIGHT COMPARISON:  None. FINDINGS: Normal appearing right hip without fracture or  dislocation. Lower lumbar spine degenerative changes. IMPRESSION: No fracture or dislocation seen. Lower lumbar spine degenerative changes. Electronically Signed   By: Claudie Revering M.D.   On: 01/05/2018 16:36    Procedures Procedures (including critical care time) FRACTURE CARE RIGHT RIB. Patient is a 72 year old male who fell about 6 feet earlier today.  He sustained fracture of the 10th right rib.  I discussed the fracture with the patient and his family in terms of which they understand.  I have also discussed the treatment plan up to this point.  Incentive spirometer was ordered.  Patient was given instructions on its use.  Patient was given medications IV for pain with significant improvement in his pain.  After pain medication, the patient is able to ambulate.  He is able to take a deep breath.  There is no hemoptysis noted since the fall.  No difficulty with breathing.  The patient will be treated with muscle relaxer as well as with pain medication at home.  He will return to the emergency department if any changes in his condition, problems, or concerns. Medications Ordered in ED Medications - No data to display   Initial Impression / Assessment and Plan / ED Course  I have reviewed the triage vital signs and the nursing notes.  Pertinent labs & imaging results that were available during my care of the patient were reviewed by me and considered in my medical decision making (see chart for details).       Final Clinical Impressions(s) / ED Diagnoses MDM  Vital signs within normal limits.  Pulse oximetry is 96% on room air.  Within normal limits by my interpretation.  Patient examined with me by Dr. Gilford Raid  Patient has a large hematoma of the right back/flank area.  He has a large abrasion in that area.  He has an abrasion on the posterior portion of the right humerus area, and an abrasion on the right wrist.  Sterile dressing has been applied to these areas.  A comprehensive  metabolic panel shows the glucose to be elevated at 199, the BUN is slightly elevated at 26, otherwise the test is within normal limits.  The anion gap is normal at 9.  The complete blood count shows a significant elevation of the white blood cell count of 18,400 it is believed that some of this is contributed to the patient's recent trauma.  There is no significant shift to the left.  Platelets are normal at 230,000.  Hemoglobin is normal at 14 and hematocrit is normal at 42.  Right rib and chest x-ray shows no rib fracture and no acute abnormality.  X-ray of the right hip and pelvis show no fracture or dislocation.  There is some lower lumbar spine degenerative changes present.  Patient having a great deal of pain.  He also has a large hematoma present, and he has a significant mechanism of action having fallen 6 feet and hit a railing.  A CT scan of the chest, abdomen, and pelvis have been ordered for the patient.  The patient denies hitting his head and he denies any neck area pain.  Patient examined with me by Dr. Gilford Raid.  Patient states that the initial morphine that he was given did not help much.  Patient given IV fentanyl and IV Ativan 0.5 mg to assist with muscle spasm.  CT scan of the abdomen pelvis shows an an acute right L2 and L3 transverse process fracture.  There is no acute solid or hollow vesicular organ injury.  There is a cyst on the left hepatic lobe measuring 19 x 15 x 20 mm.  There is also chronic diverticulosis without acute diverticulitis.  CT scan of the chest shows a minimally displaced 10th rib fracture.  There is no pneumothorax, and no evidence of pulmonary contusion or effusion.  There is no aneurysmal atherosclerosis noted.  There is noted left main and three-vessel coronary arterial sclerosis.  After the second dose of pain medication the patient was ambulated in the hallway with assistance.  He seemed to tolerate this with minimal problem.  Incentive spirometer was  ordered for the patient through the respiratory therapy team.  Patient was given instructions on how to use this.  The patient's daughter who is a Equities trader was in the room and will also assist him with this.  Urine analysis was slightly delayed but when obtained showed a clear yellow specimen with a specific gravity elevated at 1.046.  There is 50 mg/daL of glucose.  The nitrates were positive the leukocyte esterase is moderate.  There is 21-50 WBCs and a few bacteria.  We discussed the importance of good hydration, the patient and family acknowledge understanding.  A culture has been sent to the lab.  The patient is started on Keflex for this issue.  I have discussed the findings of my examination, as well as the findings of the CT scans and the lab work with the patient and with the family in terms which they understand.  Questions were answered.  A message was sent to Dr. Luan Pulling to facilitate follow-up in the office in the coming week. Vital signs remained stable.  Feel that it is safe for the patient to be discharged home.  I have given the patient strict instructions to return immediately if any high fever, unusual weakness, changes in his pain, changes in his condition, problems, or concerns.  A prescription for Keflex, Robaxin for spasm pain, and Norco  given to the patient.   Final diagnoses:  Fracture of one rib, right side, initial encounter for closed fracture  Lumbar transverse process fracture, closed, initial encounter [facility])  Urinary tract infection without hematuria, site unspecified  Fall, initial encounter  Abrasions of multiple sites    ED Discharge Orders         Ordered    HYDROcodone-acetaminophen (NORCO/VICODIN) 5-325 MG tablet  Every 4 hours PRN     01/05/18 2145    cephALEXin (KEFLEX) 500 MG capsule  4 times daily     01/05/18 2147    HYDROcodone-acetaminophen (NORCO/VICODIN) 5-325 MG tablet  Every 4 hours PRN     01/05/18 2147    methocarbamol (ROBAXIN) 500 MG  tablet  3 times daily     01/05/18 2147           Lily Kocher, PA-C 01/05/18 2208    Isla Pence, MD 01/05/18 2230

## 2018-01-05 NOTE — Discharge Instructions (Signed)
Your vital signs within normal limits.  The CT scan of your chest shows a broken rib at rib #10.  Please use your incentive spirometer 2 or 3 times each hour.  This will help ensure that you do not get collapsed lung or worsening problems related to your rib fracture.  The CT scan of your chest also shows some hardening of the arteries in your heart, particularly at one called the left main.  Please discuss this with Dr. Luan Pulling.  The CT scan of your abdomen and pelvis reveals transverse process fractures at the lumbar area on the right at L2 and L3.  These will heal on their own, but because of the muscles related to these areas, you will be very sore over the next few weeks.  Your urine test shows that you are mildly dehydrated, and that you have a urinary tract infection.  Please use Keflex with breakfast, lunch, dinner, and at bedtime until all taken.  Please see Dr. Luan Pulling in the office for recheck of your urine in 7 or 8 days. You have multiple abrasions. Please apply neosporin and a non-stick dressing to the wounds daily until they are healed.  Use Tylenol extra strength every 4 hours for mild pain.  Use Robaxin 3 times daily to help decrease some of the spasm of the multiple muscles that were involved.  May use Norco for more severe pain.  Norco and Robaxin may cause drowsiness, and/or lightheadedness.  Please change positions slowly when taking this medication.  Please do not drive a vehicle, operate machinery, handle illegal documents, or participate in activities requiring concentration when taking either of these medications.  Please see Dr. Luan Pulling, or return to the emergency department immediately if any changes in your condition, any high fever, difficulty with your breathing, problems, or concerns.

## 2018-01-08 ENCOUNTER — Telehealth: Payer: Self-pay | Admitting: Nurse Practitioner

## 2018-01-08 LAB — URINE CULTURE
Culture: >=100000 — AB
SPECIAL REQUESTS: NORMAL

## 2018-01-08 MED FILL — Hydrocodone-Acetaminophen Tab 5-325 MG: ORAL | Qty: 6 | Status: AC

## 2018-01-08 NOTE — Telephone Encounter (Signed)
Received walk-in note from patient's daughter which states patient fell on Friday and broke ribs. She states he was treated in the [facility] ED and "hardening of arteries" was seen on CT so family wants to get echo, carotid and lexiscan myoview done ASAP, however they do not feel that patient can tolerate all of these tests this week as scheduled. I reviewed the requirements of each test and we rescheduled patient's myoview to 8/27 to give patient time to overcome some of his chest and back discomfort. Daughter verbalized understanding and agreement with plan that carotid and echo will be done this week as previously scheduled and myoview moved to 8/27. She thanked me for my help.

## 2018-01-09 ENCOUNTER — Telehealth: Payer: Self-pay | Admitting: Emergency Medicine

## 2018-01-09 ENCOUNTER — Other Ambulatory Visit (HOSPITAL_COMMUNITY): Payer: Self-pay | Admitting: Pulmonary Disease

## 2018-01-09 ENCOUNTER — Ambulatory Visit (HOSPITAL_COMMUNITY)
Admission: RE | Admit: 2018-01-09 | Discharge: 2018-01-09 | Disposition: A | Discharge status: Home / Self Care | Payer: Medicare Other | Source: Ambulatory Visit | Attending: Cardiology | Admitting: Cardiology

## 2018-01-09 ENCOUNTER — Ambulatory Visit (HOSPITAL_COMMUNITY)
Admission: RE | Admit: 2018-01-09 | Discharge: 2018-01-09 | Disposition: A | Discharge status: Home / Self Care | Payer: Medicare Other | Source: Ambulatory Visit | Attending: Pulmonary Disease | Admitting: Pulmonary Disease

## 2018-01-09 DIAGNOSIS — R079 Chest pain, unspecified: Secondary | ICD-10-CM

## 2018-01-09 DIAGNOSIS — R0989 Other specified symptoms and signs involving the circulatory and respiratory systems: Secondary | ICD-10-CM | POA: Insufficient documentation

## 2018-01-09 NOTE — Progress Notes (Signed)
ED Antimicrobial Stewardship Positive Culture Follow Up   Brandon [name] is an 72 y.o. male who presented to [facility] on 01/05/2018 with a chief complaint of a fall. Chief Complaint  Patient presents with  . Fall    Recent Results (from the past 720 hour(s))  Urine Culture     Status: Abnormal   Collection Time: 01/05/18  8:32 PM  Result Value Ref Range Status   Specimen Description   Final    URINE, CLEAN CATCH Performed at [facility], [address], [place], [place] [address]    Special Requests   Final    Normal Performed at [facility], [address] [place], [place] [address]    Culture >=100,000 COLONIES/mL ESCHERICHIA COLI (A)  Final   Report Status 01/08/2018 FINAL  Final   Organism ID, Bacteria ESCHERICHIA COLI (A)  Final      Susceptibility   Escherichia coli - MIC*    AMPICILLIN >=32 RESISTANT Resistant     CEFAZOLIN >=64 RESISTANT Resistant     CEFTRIAXONE <=1 SENSITIVE Sensitive     CIPROFLOXACIN <=0.25 SENSITIVE Sensitive     GENTAMICIN <=1 SENSITIVE Sensitive     IMIPENEM <=0.25 SENSITIVE Sensitive     NITROFURANTOIN <=16 SENSITIVE Sensitive     TRIMETH/SULFA <=20 SENSITIVE Sensitive     AMPICILLIN/SULBACTAM >=32 RESISTANT Resistant     PIP/TAZO <=4 SENSITIVE Sensitive     Extended ESBL NEGATIVE Sensitive     * >=100,000 COLONIES/mL ESCHERICHIA COLI    Treated with Keflex but organism resistant to prescribed antibiotic. Patient, however, likely does not have UTI. Call patient to do symptom check. If no urinary symptoms are present, patient can stop Keflex. If urinary symptoms are present, stop Keflex and switch to Fosfomycin 3g x1.  ED Provider: Wyn Quaker, PA-C   Jackson Latino, PharmD PGY1 Pharmacy Resident Phone [phone] 01/09/2018     9:20 AM

## 2018-01-09 NOTE — Telephone Encounter (Signed)
Post ED Visit - Positive Culture Follow-up: Successful Patient Follow-Up  Culture assessed and recommendations reviewed by:  []  Elenor Quinones, Pharm.D. []  Heide Guile, Pharm.D., BCPS AQ-ID []  Parks Neptune, Pharm.D., BCPS []  Alycia Rossetti, Pharm.D., BCPS []  [place], Pharm.D., BCPS, AAHIVP []  Legrand Como, Pharm.D., BCPS, AAHIVP []  Salome Arnt, PharmD, BCPS []  Johnnette Gourd, PharmD, BCPS []  Hughes Better, PharmD, BCPS []  Leeroy Cha, PharmD Marzetta Merino PharmD  Positive urine culture  []  Patient discharged without antimicrobial prescription and treatment is now indicated [x]  Organism is resistant to prescribed ED discharge antimicrobial []  Patient with positive blood cultures  Changes discussed with ED provider: Wyn Quaker PA Symptom check, if symptomatic, switch to fosfomycin 3 gram x 1 Called to [facility] @ [phone]  Contacted patient, 01/09/18 [name], Brandon [name] 01/09/2018, 10:46 AM

## 2018-01-10 DIAGNOSIS — I25119 Atherosclerotic heart disease of native coronary artery with unspecified angina pectoris: Secondary | ICD-10-CM | POA: Diagnosis not present

## 2018-01-10 DIAGNOSIS — J411 Mucopurulent chronic bronchitis: Secondary | ICD-10-CM | POA: Diagnosis not present

## 2018-01-10 DIAGNOSIS — S2231XA Fracture of one rib, right side, initial encounter for closed fracture: Secondary | ICD-10-CM | POA: Diagnosis not present

## 2018-01-12 ENCOUNTER — Other Ambulatory Visit: Payer: Self-pay

## 2018-01-12 ENCOUNTER — Encounter (HOSPITAL_COMMUNITY): Payer: Medicare Other

## 2018-01-12 ENCOUNTER — Ambulatory Visit (HOSPITAL_COMMUNITY): Payer: Medicare Other | Attending: Cardiology

## 2018-01-12 DIAGNOSIS — R0600 Dyspnea, unspecified: Secondary | ICD-10-CM | POA: Insufficient documentation

## 2018-01-12 DIAGNOSIS — I10 Essential (primary) hypertension: Secondary | ICD-10-CM | POA: Diagnosis not present

## 2018-01-12 DIAGNOSIS — R600 Localized edema: Secondary | ICD-10-CM | POA: Diagnosis not present

## 2018-01-12 DIAGNOSIS — R51 Headache: Secondary | ICD-10-CM | POA: Diagnosis not present

## 2018-01-12 DIAGNOSIS — R42 Dizziness and giddiness: Secondary | ICD-10-CM | POA: Diagnosis not present

## 2018-01-12 DIAGNOSIS — I25118 Atherosclerotic heart disease of native coronary artery with other forms of angina pectoris: Secondary | ICD-10-CM | POA: Diagnosis not present

## 2018-01-18 ENCOUNTER — Telehealth (HOSPITAL_COMMUNITY): Payer: Self-pay | Admitting: *Deleted

## 2018-01-18 DIAGNOSIS — I25119 Atherosclerotic heart disease of native coronary artery with unspecified angina pectoris: Secondary | ICD-10-CM | POA: Diagnosis not present

## 2018-01-18 DIAGNOSIS — J449 Chronic obstructive pulmonary disease, unspecified: Secondary | ICD-10-CM | POA: Diagnosis not present

## 2018-01-18 DIAGNOSIS — I10 Essential (primary) hypertension: Secondary | ICD-10-CM | POA: Diagnosis not present

## 2018-01-18 NOTE — Telephone Encounter (Signed)
Patient given detailed instructions per Myocardial Perfusion Study Information Sheet for the test on 01/23/18. Patient notified to arrive 15 minutes early and that it is imperative to arrive on time for appointment to keep from having the test rescheduled.  If you need to cancel or reschedule your appointment, please call the office within 24 hours of your appointment. . Patient verbalized understanding. Deundra Bard Jacqueline    

## 2018-01-23 ENCOUNTER — Ambulatory Visit (HOSPITAL_COMMUNITY): Payer: Medicare Other | Attending: Cardiology

## 2018-01-23 ENCOUNTER — Other Ambulatory Visit (HOSPITAL_COMMUNITY): Payer: Self-pay | Admitting: *Deleted

## 2018-01-23 VITALS — Ht 68.0 in | Wt 170.0 lb

## 2018-01-23 DIAGNOSIS — I25118 Atherosclerotic heart disease of native coronary artery with other forms of angina pectoris: Secondary | ICD-10-CM | POA: Insufficient documentation

## 2018-01-23 DIAGNOSIS — R42 Dizziness and giddiness: Secondary | ICD-10-CM | POA: Insufficient documentation

## 2018-01-23 DIAGNOSIS — R0609 Other forms of dyspnea: Secondary | ICD-10-CM | POA: Diagnosis not present

## 2018-01-23 DIAGNOSIS — I10 Essential (primary) hypertension: Secondary | ICD-10-CM | POA: Insufficient documentation

## 2018-01-23 DIAGNOSIS — I779 Disorder of arteries and arterioles, unspecified: Secondary | ICD-10-CM | POA: Insufficient documentation

## 2018-01-23 DIAGNOSIS — R110 Nausea: Secondary | ICD-10-CM

## 2018-01-23 DIAGNOSIS — R0600 Dyspnea, unspecified: Secondary | ICD-10-CM | POA: Diagnosis present

## 2018-01-23 LAB — MYOCARDIAL PERFUSION IMAGING
CHL CUP NUCLEAR SDS: 0
CHL CUP NUCLEAR SRS: 0
CHL CUP RESTING HR STRESS: 60 {beats}/min
CSEPPHR: 85 {beats}/min
LV dias vol: 70 mL (ref 62–150)
LVSYSVOL: 23 mL
SSS: 0
TID: 1.12

## 2018-01-23 MED ORDER — TECHNETIUM TC 99M TETROFOSMIN IV KIT
31.9000 | PACK | Freq: Once | INTRAVENOUS | Status: AC | PRN
  Administered 2018-01-23: 31.9 via INTRAVENOUS
  Filled 2018-01-23: qty 32

## 2018-01-23 MED ORDER — TECHNETIUM TC 99M TETROFOSMIN IV KIT
10.6000 | PACK | Freq: Once | INTRAVENOUS | Status: AC | PRN
  Administered 2018-01-23: 10.6 via INTRAVENOUS
  Filled 2018-01-23: qty 11

## 2018-01-23 MED ORDER — AMINOPHYLLINE 25 MG/ML IV SOLN
75.0000 mg | Freq: Once | INTRAVENOUS | Status: AC
Start: 2018-01-23 — End: 2018-01-23
  Administered 2018-01-23: 75 mg via INTRAVENOUS

## 2018-01-23 MED ORDER — REGADENOSON 0.4 MG/5ML IV SOLN
0.4000 mg | Freq: Once | INTRAVENOUS | Status: AC
  Administered 2018-01-23: 0.4 mg via INTRAVENOUS

## 2018-01-31 ENCOUNTER — Telehealth: Payer: Self-pay | Admitting: Cardiovascular Disease

## 2018-01-31 NOTE — Telephone Encounter (Signed)
OK to stop norvasc

## 2018-01-31 NOTE — Telephone Encounter (Signed)
Called patient's daughter back. Patient has been complaining of BLE pitting edema , nausea, dizziness and with BP running around 100/50. Patient had two falls one on Saturday and then on Monday. Informed patient's daughter that patient's myoview and echo looked normal and patient had right carotid stenosis, with no advancement. Asked if patient had seen his PCP. Patient's daughter stated he saw him this week and he could not find anything wrong. Daughter wants patient to come off his norvasc, that she thinks it's too much for him. Daughter stated patient will come to the office and say he is fine, but he is not. Informed patient daughter that patient could cut his norvasc in half to see if his symptoms improve. Will send message to Dr. Johnsie Cancel for further advisement.

## 2018-01-31 NOTE — Telephone Encounter (Signed)
New Message    Pt daughter came in this afternoon about her father. She said that her father has had test done for his SOB and he has fallen twice.He feel on Saturday and Monday. She said the medication he is on is making him lightheaded and dizzy. Please call pt daughter Verdie Drown.   She said she wants to know if there is anything else that he can take other than norvasc.

## 2018-02-01 NOTE — Telephone Encounter (Signed)
Called patient back about Dr. Kyla Balzarine recommendations. Patient's daughter verbalized understanding. Will update patient's medication list.

## 2018-02-21 ENCOUNTER — Other Ambulatory Visit: Payer: Self-pay | Admitting: Cardiovascular Disease

## 2018-02-22 ENCOUNTER — Telehealth: Payer: Self-pay

## 2018-02-22 MED ORDER — AMLODIPINE BESYLATE 10 MG PO TABS: 10.0000 mg | ORAL_TABLET | Freq: Every day | ORAL | 3 refills | Status: DC

## 2018-02-22 NOTE — Telephone Encounter (Signed)
Patient stated his BP has gone back up and he wants to start taking amlodipine again. Patient was on amlodipine 10 mg by mouth daily. Patient stated his BP has been going up ever since he stopped taking amlodipine and now he is having trouble sleeping. Patient's current BP is 155/90. At patient's last visit his BP was 100/66, and he was on amlodipine at the time. Will forward to Dr. Johnsie Cancel for advisement.   Also, discuss with patient about changing his DPR next time he is in the office. Informed patient that right now he has his daughter listed for his designated party release.

## 2018-02-22 NOTE — Telephone Encounter (Signed)
OK to restart 

## 2018-02-22 NOTE — Telephone Encounter (Signed)
Will send amlodipine to patient's pharmacy of choice. Patient will call if BP is low and he starts having symptoms.

## 2018-02-22 NOTE — Telephone Encounter (Signed)
Patient called and was very upset that his daughter called on 01/31/18 and discussed his low blood pressure, dizziness, and falls. He states that his medication changes should be discussed with him. He states that he was not told to stop his Amlodipine even though he has not taken the medication in 2-3 weeks. He states that this morning his blood pressure was 155/90. He would like a call back.

## 2018-03-01 DIAGNOSIS — I10 Essential (primary) hypertension: Secondary | ICD-10-CM | POA: Diagnosis not present

## 2018-03-01 DIAGNOSIS — N401 Enlarged prostate with lower urinary tract symptoms: Secondary | ICD-10-CM | POA: Diagnosis not present

## 2018-03-01 DIAGNOSIS — I25119 Atherosclerotic heart disease of native coronary artery with unspecified angina pectoris: Secondary | ICD-10-CM | POA: Diagnosis not present

## 2018-03-01 DIAGNOSIS — J411 Mucopurulent chronic bronchitis: Secondary | ICD-10-CM | POA: Diagnosis not present

## 2018-03-02 DIAGNOSIS — I25119 Atherosclerotic heart disease of native coronary artery with unspecified angina pectoris: Secondary | ICD-10-CM | POA: Diagnosis not present

## 2018-03-02 DIAGNOSIS — N401 Enlarged prostate with lower urinary tract symptoms: Secondary | ICD-10-CM | POA: Diagnosis not present

## 2018-03-02 DIAGNOSIS — Z125 Encounter for screening for malignant neoplasm of prostate: Secondary | ICD-10-CM | POA: Diagnosis not present

## 2018-03-02 DIAGNOSIS — J411 Mucopurulent chronic bronchitis: Secondary | ICD-10-CM | POA: Diagnosis not present

## 2018-03-02 DIAGNOSIS — I10 Essential (primary) hypertension: Secondary | ICD-10-CM | POA: Diagnosis not present

## 2018-03-02 LAB — BASIC METABOLIC PANEL
BUN: 13 (ref 4–21)
CO2: 25 — AB (ref 13–22)
Chloride: 108 (ref 99–108)
Creatinine: 0.9 (ref 0.6–1.3)
Glucose: 106
Potassium: 4.2 (ref 3.4–5.3)
Sodium: 140 (ref 137–147)

## 2018-03-02 LAB — COMPREHENSIVE METABOLIC PANEL
GFR calc Af Amer: 99
GFR calc non Af Amer: 86

## 2018-03-02 LAB — HEPATIC FUNCTION PANEL
ALT: 30 (ref 10–40)
AST: 36 (ref 14–40)
Alkaline Phosphatase: 107 (ref 25–125)
Bilirubin, Total: 0.5

## 2018-03-02 LAB — PSA: PSA: 3.6

## 2018-03-30 DIAGNOSIS — N3020 Other chronic cystitis without hematuria: Secondary | ICD-10-CM | POA: Diagnosis not present

## 2018-04-18 ENCOUNTER — Other Ambulatory Visit: Payer: Self-pay | Admitting: Cardiovascular Disease

## 2018-05-04 DIAGNOSIS — Z961 Presence of intraocular lens: Secondary | ICD-10-CM | POA: Diagnosis not present

## 2018-06-04 DIAGNOSIS — J411 Mucopurulent chronic bronchitis: Secondary | ICD-10-CM | POA: Diagnosis not present

## 2018-06-04 DIAGNOSIS — N401 Enlarged prostate with lower urinary tract symptoms: Secondary | ICD-10-CM | POA: Diagnosis not present

## 2018-06-04 DIAGNOSIS — I25119 Atherosclerotic heart disease of native coronary artery with unspecified angina pectoris: Secondary | ICD-10-CM | POA: Diagnosis not present

## 2018-06-04 DIAGNOSIS — I10 Essential (primary) hypertension: Secondary | ICD-10-CM | POA: Diagnosis not present

## 2018-07-02 NOTE — Progress Notes (Signed)
Patient ID: Brandon [name], male   DOB: 28-Apr-1946, 73 y.o.   MRN: [id number]           HPI: 73 y.o. seen in hospital 2015 with unstable angina and had stenting of mid LAD and OM1. EF was Normal. CRF;s HTN. Has carotid plaque by duplex 01/09/38  Normal myovue 01/23/18 EF 68% Had dyspnea  In August 2019 TTE EF 65-70% only grade one diastolic appropriate for age. 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 Ciprofloxacin Nausea And Vomiting     Current Outpatient Medications  Medication Sig Dispense Refill  . atorvastatin (LIPITOR) 80 MG tablet TAKE ONE TABLET BY MOUTH DAILY AT 6 PM. 30 tablet 8  . butalbital-acetaminophen-caffeine (FIORICET, ESGIC) 50-325-40 MG per tablet Take 1 tablet by mouth every 4 (four) hours as needed for headache.    . esomeprazole (NEXIUM) 20 MG capsule Take 1 caplsule by mouth before breakfast    . NITROSTAT 0.4 MG SL tablet PLACE 1 TABLET UNDER TONGUE FOR CHEST PAIN. MAY REPEAT EVERY 5 MIN UPTO 3 DOSES-NO RELIEF,CALL 911. 25 tablet 3  . amLODipine (NORVASC) 10 MG tablet Take 1 tablet (10 mg total) by mouth daily. 90 tablet 3   No current facility-administered medications for this visit.     Past Medical History:  Diagnosis Date  . Arthritis   . Blood transfusion    no trouble-51 years ago  . CAD S/P percutaneous coronary angioplasty: DES PCI to mLAD & OM1 04/03/2014   a. Lesion #1: (OM 1 90%) Promus Premier DES 2.25 x 12 mm (2.5 mm)  Lesion #2: (mid LAD 70% ) Promus Premier DES 2.5 x 16 mm  (2.75 mm)   . Cancer (HCC)    Skin  . Chronic headaches   . Chronic prostatitis   . Diverticulitis   . GERD (gastroesophageal reflux disease)     . Hematuria   . Hernia of other specified sites of abdominal cavity without mention of obstruction or gangrene   . Hypertension   . Other specified forms of hearing loss   . PONV (postoperative nausea and vomiting)   . Shortness of breath    with exertion    Past Surgical History:  Procedure Laterality Date  . CHOLECYSTECTOMY     aph-15 yrs ago0-jenkins  . CORONARY STENT PLACEMENT  04/03/2014   DES  OMI  &  LAD  . 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 INGUINAL HERNIA REPAIR  04/13/2011   Procedure: HERNIA REPAIR INGUINAL ADULT;  Surgeon: [name];  Location: [facility];  Service: General;  Laterality: Right;  . LEFT HEART CATHETERIZATION WITH CORONARY ANGIOGRAM N/A 04/03/2014   Procedure: LEFT HEART CATHETERIZATION WITH CORONARY ANGIOGRAM;  Surgeon: [name], MD;  Location: [facility] CATH LAB;  Service: Cardiovascular;  Laterality: N/A;  . PERCUTANEOUS CORONARY STENT INTERVENTION (PCI-S)  04/03/2014   Procedure: PERCUTANEOUS CORONARY STENT INTERVENTION (PCI-S);  Surgeon: [name], MD;  Location: [facility] CATH LAB;  Service:  Cardiovascular;;  . RECONSTRUCTION MID-FACE     left face    Family History  Problem Relation Age of Onset  . Heart attack Mother   . Heart disease Mother   . Hypertension Mother   . Heart attack Father   . Heart disease Father   . Hypertension Father     Social History   Socioeconomic History  . Marital status: Married    Spouse name: Not on file  . Number of children: Not on file  . Years of education: Not on file  . Highest education level: Not on file  Occupational History  . Not on file  Social Needs  . Financial resource strain: Not on file  . Food insecurity:    Worry: Not on file    Inability: Not on file  . Transportation needs:    Medical: Not on file    Non-medical: Not on file  Tobacco Use  . Smoking status: Former Smoker    Packs/day: 2.00     Years: 14.00    Pack years: 28.00    Types: Cigarettes    Last attempt to quit: 04/10/1969    Years since quitting: 49.2  . Smokeless tobacco: Never Used  Substance and Sexual Activity  . Alcohol use: No  . Drug use: No  . Sexual activity: Not on file  Lifestyle  . Physical activity:    Days per week: Not on file    Minutes per session: Not on file  . Stress: Not on file  Relationships  . Social connections:    Talks on phone: Not on file    Gets together: Not on file    Attends religious service: Not on file    Active member of club or organization: Not on file    Attends meetings of clubs or organizations: Not on file    Relationship status: Not on file  . Intimate partner violence:    Fear of current or ex partner: Not on file    Emotionally abused: Not on file    Physically abused: Not on file    Forced sexual activity: Not on file  Other Topics Concern  . Not on file  Social History Narrative  . Not on file    ROS: See HPI otherwise negative  BP 118/74   Pulse 72   Ht 5\' 8"  (1.727 m)   Wt 174 lb (78.9 kg)   BMI 26.46 kg/m   Affect appropriate Anxious thin male  HEENT: normal Neck supple with no adenopathy JVP normal no bruits no thyromegaly Lungs clear with no wheezing and good diaphragmatic motion Heart:  S1/S2 SEM murmur, no rub, gallop or click PMI normal Abdomen: benighn, BS positve, no tenderness, no AAA no bruit.  No HSM or HJR Distal pulses intact with no bruits No edema Neuro non-focal Skin warm and dry No muscular weakness  Left arm scars broken multiple times copper bracelet     Wt Readings from Last 3 Encounters:  07/09/18 174 lb (78.9 kg)  01/23/18 170 lb (77.1 kg)  01/05/18 170 lb (77.1 kg)    Lab Results  Component Value Date   WBC 18.4 (H) 01/05/2018   HGB 14.1 01/05/2018   HCT 42.0 01/05/2018   PLT 230 01/05/2018   GLUCOSE 199 (H) 01/05/2018   CHOL 118 05/27/2014   TRIG 92.0 05/27/2014   HDL 33.60 (L) 05/27/2014    LDLCALC 66 05/27/2014   ALT 31 01/05/2018   AST 29 01/05/2018  NA 139 01/05/2018   K 4.3 01/05/2018   CL 107 01/05/2018   CREATININE 1.17 01/05/2018   BUN 26 (H) 01/05/2018   CO2 23 01/05/2018   INR 1.0 03/31/2014    EKG  07/09/18 SR rate 72 normal   Plan:  CAD:  11/15 stenting of OM and LAD positive ETT Last myovue  01/23/18 normal  No longer on DAT   HTN:  Well controlled.  Continue current medications and low sodium Dash type diet.  Cozaar stopped due to headache but may be cervical issue as he still has it at night when sleeping   Chol:  On statin labs with primary   Carotid plaque no stenosis f/u August 2021   Dyspnea/Edema:  : Normal exam with no edema Normal EF by TTE 01/12/18 observe   Diplopia-  2nd opinion with Dr Gershon Crane ok          [name]

## 2018-07-09 ENCOUNTER — Encounter (INDEPENDENT_AMBULATORY_CARE_PROVIDER_SITE_OTHER): Payer: Self-pay

## 2018-07-09 ENCOUNTER — Ambulatory Visit (INDEPENDENT_AMBULATORY_CARE_PROVIDER_SITE_OTHER): Payer: Medicare Other | Admitting: Cardiovascular Disease

## 2018-07-09 VITALS — BP 118/74 | HR 72 | Ht 68.0 in | Wt 174.0 lb

## 2018-07-09 DIAGNOSIS — I25118 Atherosclerotic heart disease of native coronary artery with other forms of angina pectoris: Secondary | ICD-10-CM | POA: Diagnosis not present

## 2018-07-09 DIAGNOSIS — I6523 Occlusion and stenosis of bilateral carotid arteries: Secondary | ICD-10-CM

## 2018-07-09 DIAGNOSIS — I10 Essential (primary) hypertension: Secondary | ICD-10-CM

## 2018-07-09 NOTE — Patient Instructions (Addendum)

## 2018-07-23 DIAGNOSIS — I25119 Atherosclerotic heart disease of native coronary artery with unspecified angina pectoris: Secondary | ICD-10-CM | POA: Diagnosis not present

## 2018-07-23 DIAGNOSIS — I10 Essential (primary) hypertension: Secondary | ICD-10-CM | POA: Diagnosis not present

## 2018-07-23 DIAGNOSIS — G44321 Chronic post-traumatic headache, intractable: Secondary | ICD-10-CM | POA: Diagnosis not present

## 2018-07-23 DIAGNOSIS — R1032 Left lower quadrant pain: Secondary | ICD-10-CM | POA: Diagnosis not present

## 2018-10-01 ENCOUNTER — Other Ambulatory Visit: Payer: Self-pay | Admitting: Cardiology

## 2018-10-01 DIAGNOSIS — I6523 Occlusion and stenosis of bilateral carotid arteries: Secondary | ICD-10-CM

## 2018-12-18 DIAGNOSIS — N3020 Other chronic cystitis without hematuria: Secondary | ICD-10-CM | POA: Diagnosis not present

## 2018-12-18 DIAGNOSIS — R8271 Bacteriuria: Secondary | ICD-10-CM | POA: Diagnosis not present

## 2018-12-25 DIAGNOSIS — I2510 Atherosclerotic heart disease of native coronary artery without angina pectoris: Secondary | ICD-10-CM | POA: Diagnosis not present

## 2018-12-25 DIAGNOSIS — J411 Mucopurulent chronic bronchitis: Secondary | ICD-10-CM | POA: Diagnosis not present

## 2018-12-25 DIAGNOSIS — I10 Essential (primary) hypertension: Secondary | ICD-10-CM | POA: Diagnosis not present

## 2018-12-25 DIAGNOSIS — N401 Enlarged prostate with lower urinary tract symptoms: Secondary | ICD-10-CM | POA: Diagnosis not present

## 2019-01-06 NOTE — Patient Instructions (Addendum)
Medication Instructions:  STOP: Isosorbide (Imdur)  If you need a refill on your cardiac medications before your next appointment, please call your pharmacy.   Lab work: None   If you have labs (blood work) drawn today and your tests are completely normal, you will receive your results only by: Marland Kitchen MyChart Message (if you have MyChart) OR . A paper copy in the mail If you have any lab test that is abnormal or we need to change your treatment, we will call you to review the results.  Testing/Procedures: none  Follow-Up: At [facility], you and your health needs are our priority.  As part of our continuing mission to provide you with exceptional heart care, we have created designated Provider Care Teams.  These Care Teams include your primary Cardiologist (physician) and Advanced Practice Providers (APPs -  Physician Assistants and Nurse Practitioners) who all work together to provide you with the care you need, when you need it. You will need a follow up appointment in 6 months.  Please call our office 2 months in advance to schedule this appointment.  You may see [name], MD or one of the following Advanced Practice Providers on your designated Care Team:   Truitt Merle, NP Cecilie Kicks, NP . Kathyrn Drown, NP  Any Other Special Instructions Will Be Listed Below (If Applicable).   Try to decrease the amount of very strenuous activity like using a chainsaw or sledge hammer. If you do strenuous activity, take breaks and make sure you get enough to drink on hot days.  Call us if you develop back or chest pain with lesser activity.     Lifestyle Modifications to Prevent and Treat Heart Disease -Recommend heart healthy/Mediterranean diet, with whole grains, fruits, vegetables, fish, lean meats, nuts, olive oil and avocado oil.  -Limit salt intake to less than 1500 mg per day.  -Recommend moderate walking, starting slowly with a few minutes and working up to 3-5 times/week for  30-50 minutes each session. Aim for at least 150 minutes.week. Goal should be pace of 3 miles/hours, or walking 1.5 miles in 30 minutes -Recommend avoidance of tobacco products. Avoid excess alcohol. -Keep blood pressure well controlled, ideally less than 130/80.     DASH Eating Plan DASH stands for "Dietary Approaches to Stop Hypertension." The DASH eating plan is a healthy eating plan that has been shown to reduce high blood pressure (hypertension). It may also reduce your risk for type 2 diabetes, heart disease, and stroke. The DASH eating plan may also help with weight loss. What are tips for following this plan?  General guidelines  Avoid eating more than 2,300 mg (milligrams) of salt (sodium) a day. If you have hypertension, you may need to reduce your sodium intake to 1,500 mg a day.  Limit alcohol intake to no more than 1 drink a day for nonpregnant women and 2 drinks a day for men. One drink equals 12 oz of beer, 5 oz of wine, or 1 oz of hard liquor.  Work with your health care provider to maintain a healthy body weight or to lose weight. Ask what an ideal weight is for you.  Get at least 30 minutes of exercise that causes your heart to beat faster (aerobic exercise) most days of the week. Activities may include walking, swimming, or biking.  Work with your health care provider or diet and nutrition specialist (dietitian) to adjust your eating plan to your individual calorie needs. Reading food labels   Check  food labels for the amount of sodium per serving. Choose foods with less than 5 percent of the Daily Value of sodium. Generally, foods with less than 300 mg of sodium per serving fit into this eating plan.  To find whole grains, look for the word "whole" as the first word in the ingredient list. Shopping  Buy products labeled as "low-sodium" or "no salt added."  Buy fresh foods. Avoid canned foods and premade or frozen meals. Cooking  Avoid adding salt when cooking.  Use salt-free seasonings or herbs instead of table salt or sea salt. Check with your health care provider or pharmacist before using salt substitutes.  Do not fry foods. Cook foods using healthy methods such as baking, boiling, grilling, and broiling instead.  Cook with heart-healthy oils, such as olive, canola, soybean, or sunflower oil. Meal planning  Eat a balanced diet that includes: ? 5 or more servings of fruits and vegetables each day. At each meal, try to fill half of your plate with fruits and vegetables. ? Up to 6-8 servings of whole grains each day. ? Less than 6 oz of lean meat, poultry, or fish each day. A 3-oz serving of meat is about the same size as a deck of cards. One egg equals 1 oz. ? 2 servings of low-fat dairy each day. ? A serving of nuts, seeds, or beans 5 times each week. ? Heart-healthy fats. Healthy fats called Omega-3 fatty acids are found in foods such as flaxseeds and coldwater fish, like sardines, salmon, and mackerel.  Limit how much you eat of the following: ? Canned or prepackaged foods. ? Food that is high in trans fat, such as fried foods. ? Food that is high in saturated fat, such as fatty meat. ? Sweets, desserts, sugary drinks, and other foods with added sugar. ? Full-fat dairy products.  Do not salt foods before eating.  Try to eat at least 2 vegetarian meals each week.  Eat more home-cooked food and less restaurant, buffet, and fast food.  When eating at a restaurant, ask that your food be prepared with less salt or no salt, if possible. What foods are recommended? The items listed may not be a complete list. Talk with your dietitian about what dietary choices are best for you. Grains Whole-grain or whole-wheat bread. Whole-grain or whole-wheat pasta. Brown rice. [place]. Bulgur. Whole-grain and low-sodium cereals. Pita bread. Low-fat, low-sodium crackers. Whole-wheat flour tortillas. Vegetables Fresh or frozen vegetables (raw,  steamed, roasted, or grilled). Low-sodium or reduced-sodium tomato and vegetable juice. Low-sodium or reduced-sodium tomato sauce and tomato paste. Low-sodium or reduced-sodium canned vegetables. Fruits All fresh, dried, or frozen fruit. Canned fruit in natural juice (without added sugar). Meat and other protein foods Skinless chicken or [place]. Ground chicken or [place]. Pork with fat trimmed off. Fish and seafood. Egg whites. Dried beans, peas, or lentils. Unsalted nuts, nut butters, and seeds. Unsalted canned beans. Lean cuts of beef with fat trimmed off. Low-sodium, lean deli meat. Dairy Low-fat (1%) or fat-free (skim) milk. Fat-free, low-fat, or reduced-fat cheeses. Nonfat, low-sodium ricotta or cottage cheese. Low-fat or nonfat yogurt. Low-fat, low-sodium cheese. Fats and oils Soft margarine without trans fats. Vegetable oil. Low-fat, reduced-fat, or light mayonnaise and salad dressings (reduced-sodium). Canola, safflower, olive, soybean, and sunflower oils. Avocado. Seasoning and other foods Herbs. Spices. Seasoning mixes without salt. Unsalted popcorn and pretzels. Fat-free sweets. What foods are not recommended? The items listed may not be a complete list. Talk with your dietitian about what  dietary choices are best for you. Grains Baked goods made with fat, such as croissants, muffins, or some breads. Dry pasta or rice meal packs. Vegetables Creamed or fried vegetables. Vegetables in a cheese sauce. Regular canned vegetables (not low-sodium or reduced-sodium). Regular canned tomato sauce and paste (not low-sodium or reduced-sodium). Regular tomato and vegetable juice (not low-sodium or reduced-sodium). Angie Fava. Olives. Fruits Canned fruit in a light or heavy syrup. Fried fruit. Fruit in cream or butter sauce. Meat and other protein foods Fatty cuts of meat. Ribs. Fried meat. Berniece Salines. Sausage. Bologna and other processed lunch meats. Salami. Fatback. Hotdogs. Bratwurst. Salted nuts and  seeds. Canned beans with added salt. Canned or smoked fish. Whole eggs or egg yolks. Chicken or [place] with skin. Dairy Whole or 2% milk, cream, and half-and-half. Whole or full-fat cream cheese. Whole-fat or sweetened yogurt. Full-fat cheese. Nondairy creamers. Whipped toppings. Processed cheese and cheese spreads. Fats and oils Butter. Stick margarine. Lard. Shortening. Ghee. Bacon fat. Tropical oils, such as coconut, palm kernel, or palm oil. Seasoning and other foods Salted popcorn and pretzels. Onion salt, garlic salt, seasoned salt, table salt, and sea salt. Worcestershire sauce. Tartar sauce. Barbecue sauce. Teriyaki sauce. Soy sauce, including reduced-sodium. Steak sauce. Canned and packaged gravies. Fish sauce. Oyster sauce. Cocktail sauce. Horseradish that you find on the shelf. Ketchup. Mustard. Meat flavorings and tenderizers. Bouillon cubes. Hot sauce and Tabasco sauce. Premade or packaged marinades. Premade or packaged taco seasonings. Relishes. Regular salad dressings. Where to find more information:  National Heart, Lung, and [place]: https://wilson-eaton.com/  American Heart Association: www.heart.org Summary  The DASH eating plan is a healthy eating plan that has been shown to reduce high blood pressure (hypertension). It may also reduce your risk for type 2 diabetes, heart disease, and stroke.  With the DASH eating plan, you should limit salt (sodium) intake to 2,300 mg a day. If you have hypertension, you may need to reduce your sodium intake to 1,500 mg a day.  When on the DASH eating plan, aim to eat more fresh fruits and vegetables, whole grains, lean proteins, low-fat dairy, and heart-healthy fats.  Work with your health care provider or diet and nutrition specialist (dietitian) to adjust your eating plan to your individual calorie needs. This information is not intended to replace advice given to you by your health care provider. Make sure you discuss any questions you  have with your health care provider. Document Released: 05/05/2011 Document Revised: 04/28/2017 Document Reviewed: 05/09/2016 Elsevier Patient Education  2020 [employer].

## 2019-01-06 NOTE — Progress Notes (Signed)
Cardiology Office Note:    Date:  01/07/2019   ID:  Brandon, [name] 09/05/1945, MRN [id number]  PCP:  Brandon Du, MD  Cardiologist:  Brandon Rouge, MD  Referring MD: Brandon Du, MD   Chief Complaint  Patient presents with   Chest Pain    Possible angina    History of Present Illness:    Brandon [name] is a 73 y.o. male with a past medical history significant for CAD s/p DES to LAD and OM1 2015, hypertension, carotid artery disease.  He says that last year he had back apin and was evaluated by Dr. Johnsie [name] and had a normal Myoview 01/23/2018. He says that over the last 6 months when he uses a chain saw or a sledge hammer he gets worn out after 20 minutes with significant back pain, shortness of breath and his blood pressure drops. He does not get any back pain with other activities other than chainsaw and sledge hammer outside in the heat. He does not get any chest pain or pressure. No shortness of breath with daily activities or his usual work. He gets short of breath when he going up a steep hill in the woods.   He feels like his back is tight when he is standing in a humped position like when fishing. It feels good to him to stretch his arms out and loosen his back muscles.   On Saturday he climbed up on a piece of equipment and he felt lightheaded. His vision got dark and he felt like he was going to pass out. He went home and checked his BP which was 88/52. He felt wiped out all day.   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 He uses a chain saw. Anginal equivalent is dyspnea.  Past Medical History:  Diagnosis Date   Arthritis    Blood transfusion    no trouble-51 years ago   CAD S/P percutaneous coronary angioplasty: DES PCI to mLAD & OM1 04/03/2014   a. Lesion #1: (OM 1 90%) Promus Premier DES 2.25 x 12 mm (2.5 mm)   Lesion #2: (mid LAD 70% ) Promus Premier DES 2.5 x 16 mm  (2.75 mm)    Cancer (HCC)    Skin   Chronic headaches    Chronic prostatitis    Diverticulitis    GERD (gastroesophageal reflux disease)    Hematuria    Hernia of other specified sites of abdominal cavity without mention of obstruction or gangrene    Hypertension    Other specified forms of hearing loss    PONV (postoperative nausea and vomiting)    Shortness of breath    with exertion    Past Surgical History:  Procedure Laterality Date   CHOLECYSTECTOMY     aph-15 yrs ago0-Brandon   CORONARY STENT PLACEMENT  04/03/2014   DES  OMI  &  LAD   EYE SURGERY     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":   FRACTURE SURGERY     left arm-25 yrs ago   [facility]  04/13/2011   Procedure: HERNIA REPAIR INGUINAL ADULT;  Surgeon: [name];  Location: [facility];  Service: General;  Laterality: Right;   LEFT HEART CATHETERIZATION WITH CORONARY ANGIOGRAM N/A 04/03/2014   Procedure: LEFT HEART CATHETERIZATION WITH CORONARY ANGIOGRAM;  Surgeon: Harrell Gave  Brandon Evans, MD;  Location: [facility];  Service: Cardiovascular;  Laterality: N/A;   PERCUTANEOUS CORONARY STENT INTERVENTION (PCI-S)  04/03/2014   Procedure: PERCUTANEOUS CORONARY STENT INTERVENTION (PCI-S);  Surgeon: [name], MD;  Location: [facility];  Service: Cardiovascular;;   RECONSTRUCTION MID-FACE     left face    Current Medications: Current Meds  Medication Sig   atorvastatin (LIPITOR) 80 MG tablet TAKE ONE TABLET BY MOUTH DAILY AT 6 PM.   butalbital-acetaminophen-caffeine (FIORICET, ESGIC) 50-325-40 MG per tablet Take 1 tablet by mouth every 4 (four) hours as needed for headache.   esomeprazole (NEXIUM) 20 MG capsule Take 1 caplsule by mouth before breakfast   isosorbide mononitrate (IMDUR) 30 MG 24 hr tablet Take 30 mg by mouth daily.   NITROSTAT 0.4 MG SL tablet PLACE 1 TABLET UNDER TONGUE FOR CHEST PAIN. MAY  REPEAT EVERY 5 MIN UPTO 3 DOSES-NO RELIEF,CALL 911.     Allergies:   Ciprofloxacin   Social History   Socioeconomic History   Marital status: Married    Spouse name: Not on file   Number of children: Not on file   Years of education: Not on file   Highest education level: Not on file  Occupational History   Not on file  Social Needs   Financial resource strain: Not on file   Food insecurity    Worry: Not on file    Inability: Not on file   Transportation needs    Medical: Not on file    Non-medical: Not on file  Tobacco Use   Smoking status: Former Smoker    Packs/day: 2.00    Years: 14.00    Pack years: 28.00    Types: Cigarettes    Quit date: 04/10/1969    Years since quitting: 49.7   Smokeless tobacco: Never Used  Substance and Sexual Activity   Alcohol use: No   Drug use: No   Sexual activity: Not on file  Lifestyle   Physical activity    Days per week: Not on file    Minutes per session: Not on file   Stress: Not on file  Relationships   Social connections    Talks on phone: Not on file    Gets together: Not on file    Attends religious service: Not on file    Active member of club or organization: Not on file    Attends meetings of clubs or organizations: Not on file    Relationship status: Not on file  Other Topics Concern   Not on file  Social History Narrative   Not on file     Family History: The patient's family history includes Heart attack in his father and mother; Heart disease in his father and mother; Hypertension in his father and mother. ROS:   Please see the history of present illness.     All other systems reviewed and are negative.  EKGs/Labs/Other Studies Reviewed:    The following studies were reviewed today:  Stress myoview 01/23/2018  Nuclear stress EF: 68%.  There was no ST segment deviation noted during stress.  Defect 1: There is a small defect of mild severity present in the apex location.  The  study is normal.  This is a low risk study.  The left ventricular ejection fraction is hyperdynamic (>65%).   Normal low risk stress nuclear study with mild apical thinning but no ischemia.  Gated ejection fraction 65% with normal wall motion.  Echocardiogram 01/12/2018 Study  Conclusions - Left ventricle: The cavity size was normal. Wall thickness was   normal. Systolic function was vigorous. The estimated ejection   fraction was in the range of 65% to 70%. Wall motion was normal;   there were no regional wall motion abnormalities. Doppler   parameters are consistent with abnormal left ventricular   relaxation (grade 1 diastolic dysfunction). - Aorta: Ascending aortic diameter: 38 mm (S). - Ascending aorta: The ascending aorta was mildly dilated. - Mitral valve: There was trivial regurgitation. - Right atrium: The atrium was mildly dilated.  EKG:  EKG is ordered today.  The ekg ordered today demonstrates NSR, 69 bpm with no significant ST/T changes  Recent Labs: No results found for requested labs within last 8760 hours.   Recent Lipid Panel    Component Value Date/Time   CHOL 118 05/27/2014 0731   TRIG 92.0 05/27/2014 0731   HDL 33.60 (L) 05/27/2014 0731   CHOLHDL 4 05/27/2014 0731   VLDL 18.4 05/27/2014 0731   LDLCALC 66 05/27/2014 0731    Physical Exam:    VS:  BP 122/72    Pulse 69    Ht 5\' 8"  (1.727 m)    Wt 168 lb 12.8 oz (76.6 kg)    SpO2 98%    BMI 25.67 kg/m     Orthostatic VS for the past 24 hrs (Last 3 readings):  BP- Lying Pulse- Lying BP- Sitting Pulse- Sitting BP- Standing at 0 minutes Pulse- Standing at 0 minutes  01/07/19 1508 104/71 73 112/74 71 106/71 76     Wt Readings from Last 3 Encounters:  01/07/19 168 lb 12.8 oz (76.6 kg)  07/09/18 174 lb (78.9 kg)  01/23/18 170 lb (77.1 kg)     Physical Exam  Constitutional: He is oriented to person, place, and time. He appears well-developed and well-nourished. No distress.  HENT:  Head: Normocephalic  and atraumatic.  Neck: Normal range of motion. Neck supple. No JVD present.  Cardiovascular: Normal rate, regular rhythm, normal heart sounds and intact distal pulses. Exam reveals no gallop and no friction rub.  No murmur heard. Pulmonary/Chest: Effort normal and breath sounds normal. No respiratory distress. He has no wheezes. He has no rales.  Abdominal: Soft. Bowel sounds are normal.  Musculoskeletal: Normal range of motion.        General: No edema.  Neurological: He is alert and oriented to person, place, and time.  Skin: Skin is warm and dry.  Psychiatric: He has a normal mood and affect. His behavior is normal. Judgment and thought content normal.  Nursing note and vitals reviewed.   ASSESSMENT:    1. Coronary artery disease involving native coronary artery of native heart without angina pectoris   2. Essential (primary) hypertension   3. Hyperlipidemia, unspecified hyperlipidemia type    PLAN:    In order of problems listed above:  CAD with stable angina -S/p stenting of OM and LAD in 2015.  Last Myoview was normal in 12/2017. -No longer on anti-platelet therapy -Pt with upper back pain during extreme exertion like chainsaw and sledge hammer, after 10-20 minutes.  No chest pain.  He only has shortness of breath with climbing steep hills in the woods but not with his usual exertion. His symptoms sound more musculoskeletal. I advised him to limit the extreme exertion and focus on the other tasks at work. His son can do those heavy things.  He is upset because he says he has been doing that kind of work  his whole life.  I tried to reassure him that it is okay to slow down a little bit as we age. -The patient had an episode of low blood pressure and near syncope on Saturday while outside working in the heat.  He had been started on Imdur by PCP for possible angina.  We will stop the Imdur and have patient ensure that he has had enough to drink when out in the heat and take breaks.    -I discussed a possible repeat stress test even though it has been just under a year since the last one.  He says he would rather wait and monitor his symptoms. -He will monitor his symptoms and call us if he continues to note symptoms even with cutting out the extreme exertion. Will plan on stress test if he continues to have the back pain with lesser activity.   Hypertension -On amlodipine, Imdur added by PCP.  -DASH diet -Pt with hypotension and episode of lightheadedness.  -Orthostatic VS done today are normal.  -Will stop the Imdur.   Hyperlipidemia -On Lipitor 80 mg daily -Lipids followed by primary care   Medication Adjustments/Labs and Tests Ordered: Current medicines are reviewed at length with the patient today.  Concerns regarding medicines are outlined above. Labs and tests ordered and medication changes are outlined in the patient instructions below:  Patient Instructions  Lifestyle Modifications to Prevent and Treat Heart Disease -Recommend heart healthy/Mediterranean diet, with whole grains, fruits, vegetables, fish, lean meats, nuts, olive oil and avocado oil.  -Limit salt intake to less than 1500 mg per day.  -Recommend moderate walking, starting slowly with a few minutes and working up to 3-5 times/week for 30-50 minutes each session. Aim for at least 150 minutes.week. Goal should be pace of 3 miles/hours, or walking 1.5 miles in 30 minutes -Recommend avoidance of tobacco products. Avoid excess alcohol. -Keep blood pressure well controlled, ideally less than 130/80.     DASH Eating Plan DASH stands for "Dietary Approaches to Stop Hypertension." The DASH eating plan is a healthy eating plan that has been shown to reduce high blood pressure (hypertension). It may also reduce your risk for type 2 diabetes, heart disease, and stroke. The DASH eating plan may also help with weight loss. What are tips for following this plan?  General guidelines  Avoid eating more than  2,300 mg (milligrams) of salt (sodium) a day. If you have hypertension, you may need to reduce your sodium intake to 1,500 mg a day.  Limit alcohol intake to no more than 1 drink a day for nonpregnant women and 2 drinks a day for men. One drink equals 12 oz of beer, 5 oz of wine, or 1 oz of hard liquor.  Work with your health care provider to maintain a healthy body weight or to lose weight. Ask what an ideal weight is for you.  Get at least 30 minutes of exercise that causes your heart to beat faster (aerobic exercise) most days of the week. Activities may include walking, swimming, or biking.  Work with your health care provider or diet and nutrition specialist (dietitian) to adjust your eating plan to your individual calorie needs. Reading food labels   Check food labels for the amount of sodium per serving. Choose foods with less than 5 percent of the Daily Value of sodium. Generally, foods with less than 300 mg of sodium per serving fit into this eating plan.  To find whole grains, look for the word "whole"  as the first word in the ingredient list. Shopping  Buy products labeled as "low-sodium" or "no salt added."  Buy fresh foods. Avoid canned foods and premade or frozen meals. Cooking  Avoid adding salt when cooking. Use salt-free seasonings or herbs instead of table salt or sea salt. Check with your health care provider or pharmacist before using salt substitutes.  Do not fry foods. Cook foods using healthy methods such as baking, boiling, grilling, and broiling instead.  Cook with heart-healthy oils, such as olive, canola, soybean, or sunflower oil. Meal planning  Eat a balanced diet that includes: ? 5 or more servings of fruits and vegetables each day. At each meal, try to fill half of your plate with fruits and vegetables. ? Up to 6-8 servings of whole grains each day. ? Less than 6 oz of lean meat, poultry, or fish each day. A 3-oz serving of meat is about the same size  as a deck of cards. One egg equals 1 oz. ? 2 servings of low-fat dairy each day. ? A serving of nuts, seeds, or beans 5 times each week. ? Heart-healthy fats. Healthy fats called Omega-3 fatty acids are found in foods such as flaxseeds and coldwater fish, like sardines, salmon, and mackerel.  Limit how much you eat of the following: ? Canned or prepackaged foods. ? Food that is high in trans fat, such as fried foods. ? Food that is high in saturated fat, such as fatty meat. ? Sweets, desserts, sugary drinks, and other foods with added sugar. ? Full-fat dairy products.  Do not salt foods before eating.  Try to eat at least 2 vegetarian meals each week.  Eat more home-cooked food and less restaurant, buffet, and fast food.  When eating at a restaurant, ask that your food be prepared with less salt or no salt, if possible. What foods are recommended? The items listed may not be a complete list. Talk with your dietitian about what dietary choices are best for you. Grains Whole-grain or whole-wheat bread. Whole-grain or whole-wheat pasta. Brown rice. [place]. Bulgur. Whole-grain and low-sodium cereals. Pita bread. Low-fat, low-sodium crackers. Whole-wheat flour tortillas. Vegetables Fresh or frozen vegetables (raw, steamed, roasted, or grilled). Low-sodium or reduced-sodium tomato and vegetable juice. Low-sodium or reduced-sodium tomato sauce and tomato paste. Low-sodium or reduced-sodium canned vegetables. Fruits All fresh, dried, or frozen fruit. Canned fruit in natural juice (without added sugar). Meat and other protein foods Skinless chicken or [place]. Ground chicken or [place]. Pork with fat trimmed off. Fish and seafood. Egg whites. Dried beans, peas, or lentils. Unsalted nuts, nut butters, and seeds. Unsalted canned beans. Lean cuts of beef with fat trimmed off. Low-sodium, lean deli meat. Dairy Low-fat (1%) or fat-free (skim) milk. Fat-free, low-fat, or reduced-fat cheeses.  Nonfat, low-sodium ricotta or cottage cheese. Low-fat or nonfat yogurt. Low-fat, low-sodium cheese. Fats and oils Soft margarine without trans fats. Vegetable oil. Low-fat, reduced-fat, or light mayonnaise and salad dressings (reduced-sodium). Canola, safflower, olive, soybean, and sunflower oils. Avocado. Seasoning and other foods Herbs. Spices. Seasoning mixes without salt. Unsalted popcorn and pretzels. Fat-free sweets. What foods are not recommended? The items listed may not be a complete list. Talk with your dietitian about what dietary choices are best for you. Grains Baked goods made with fat, such as croissants, muffins, or some breads. Dry pasta or rice meal packs. Vegetables Creamed or fried vegetables. Vegetables in a cheese sauce. Regular canned vegetables (not low-sodium or reduced-sodium). Regular canned tomato sauce and paste (  not low-sodium or reduced-sodium). Regular tomato and vegetable juice (not low-sodium or reduced-sodium). Angie Fava. Olives. Fruits Canned fruit in a light or heavy syrup. Fried fruit. Fruit in cream or butter sauce. Meat and other protein foods Fatty cuts of meat. Ribs. Fried meat. Berniece Salines. Sausage. Bologna and other processed lunch meats. Salami. Fatback. Hotdogs. Bratwurst. Salted nuts and seeds. Canned beans with added salt. Canned or smoked fish. Whole eggs or egg yolks. Chicken or [place] with skin. Dairy Whole or 2% milk, cream, and half-and-half. Whole or full-fat cream cheese. Whole-fat or sweetened yogurt. Full-fat cheese. Nondairy creamers. Whipped toppings. Processed cheese and cheese spreads. Fats and oils Butter. Stick margarine. Lard. Shortening. Ghee. Bacon fat. Tropical oils, such as coconut, palm kernel, or palm oil. Seasoning and other foods Salted popcorn and pretzels. Onion salt, garlic salt, seasoned salt, table salt, and sea salt. Worcestershire sauce. Tartar sauce. Barbecue sauce. Teriyaki sauce. Soy sauce, including reduced-sodium. Steak  sauce. Canned and packaged gravies. Fish sauce. Oyster sauce. Cocktail sauce. Horseradish that you find on the shelf. Ketchup. Mustard. Meat flavorings and tenderizers. Bouillon cubes. Hot sauce and Tabasco sauce. Premade or packaged marinades. Premade or packaged taco seasonings. Relishes. Regular salad dressings. Where to find more information:  National Heart, Lung, and [place]: https://wilson-eaton.com/  American Heart Association: www.heart.org Summary  The DASH eating plan is a healthy eating plan that has been shown to reduce high blood pressure (hypertension). It may also reduce your risk for type 2 diabetes, heart disease, and stroke.  With the DASH eating plan, you should limit salt (sodium) intake to 2,300 mg a day. If you have hypertension, you may need to reduce your sodium intake to 1,500 mg a day.  When on the DASH eating plan, aim to eat more fresh fruits and vegetables, whole grains, lean proteins, low-fat dairy, and heart-healthy fats.  Work with your health care provider or diet and nutrition specialist (dietitian) to adjust your eating plan to your individual calorie needs. This information is not intended to replace advice given to you by your health care provider. Make sure you discuss any questions you have with your health care provider. Document Released: 05/05/2011 Document Revised: 04/28/2017 Document Reviewed: 05/09/2016 Elsevier Patient Education  2020 [place], Daune Perch, NP  01/07/2019 3:02 PM    [facility]

## 2019-01-07 ENCOUNTER — Ambulatory Visit (INDEPENDENT_AMBULATORY_CARE_PROVIDER_SITE_OTHER): Payer: Medicare Other | Admitting: Cardiology

## 2019-01-07 ENCOUNTER — Other Ambulatory Visit: Payer: Self-pay

## 2019-01-07 ENCOUNTER — Encounter (INDEPENDENT_AMBULATORY_CARE_PROVIDER_SITE_OTHER): Payer: Self-pay

## 2019-01-07 ENCOUNTER — Encounter: Payer: Self-pay | Admitting: Cardiology

## 2019-01-07 VITALS — BP 122/72 | HR 69 | Ht 68.0 in | Wt 168.8 lb

## 2019-01-07 DIAGNOSIS — I10 Essential (primary) hypertension: Secondary | ICD-10-CM

## 2019-01-07 DIAGNOSIS — I2510 Atherosclerotic heart disease of native coronary artery without angina pectoris: Secondary | ICD-10-CM | POA: Diagnosis not present

## 2019-01-07 DIAGNOSIS — E785 Hyperlipidemia, unspecified: Secondary | ICD-10-CM

## 2019-01-07 DIAGNOSIS — I6523 Occlusion and stenosis of bilateral carotid arteries: Secondary | ICD-10-CM | POA: Diagnosis not present

## 2019-01-29 DIAGNOSIS — N3020 Other chronic cystitis without hematuria: Secondary | ICD-10-CM | POA: Diagnosis not present

## 2019-03-04 ENCOUNTER — Other Ambulatory Visit: Payer: Self-pay | Admitting: Cardiovascular Disease

## 2019-03-26 DIAGNOSIS — I10 Essential (primary) hypertension: Secondary | ICD-10-CM | POA: Diagnosis not present

## 2019-03-26 DIAGNOSIS — I25119 Atherosclerotic heart disease of native coronary artery with unspecified angina pectoris: Secondary | ICD-10-CM | POA: Diagnosis not present

## 2019-03-26 DIAGNOSIS — N401 Enlarged prostate with lower urinary tract symptoms: Secondary | ICD-10-CM | POA: Diagnosis not present

## 2019-05-06 DIAGNOSIS — Z961 Presence of intraocular lens: Secondary | ICD-10-CM | POA: Diagnosis not present

## 2019-05-26 IMAGING — DX DG CHEST 2V
2 series · 2 of 2 positions shown · non-contrast
Comparison: Radiographs and CT scan January 05, 2018.

CLINICAL DATA: Chest pain after fall 2 weeks ago.

EXAM:
CHEST - 2 VIEW

[chest pa]
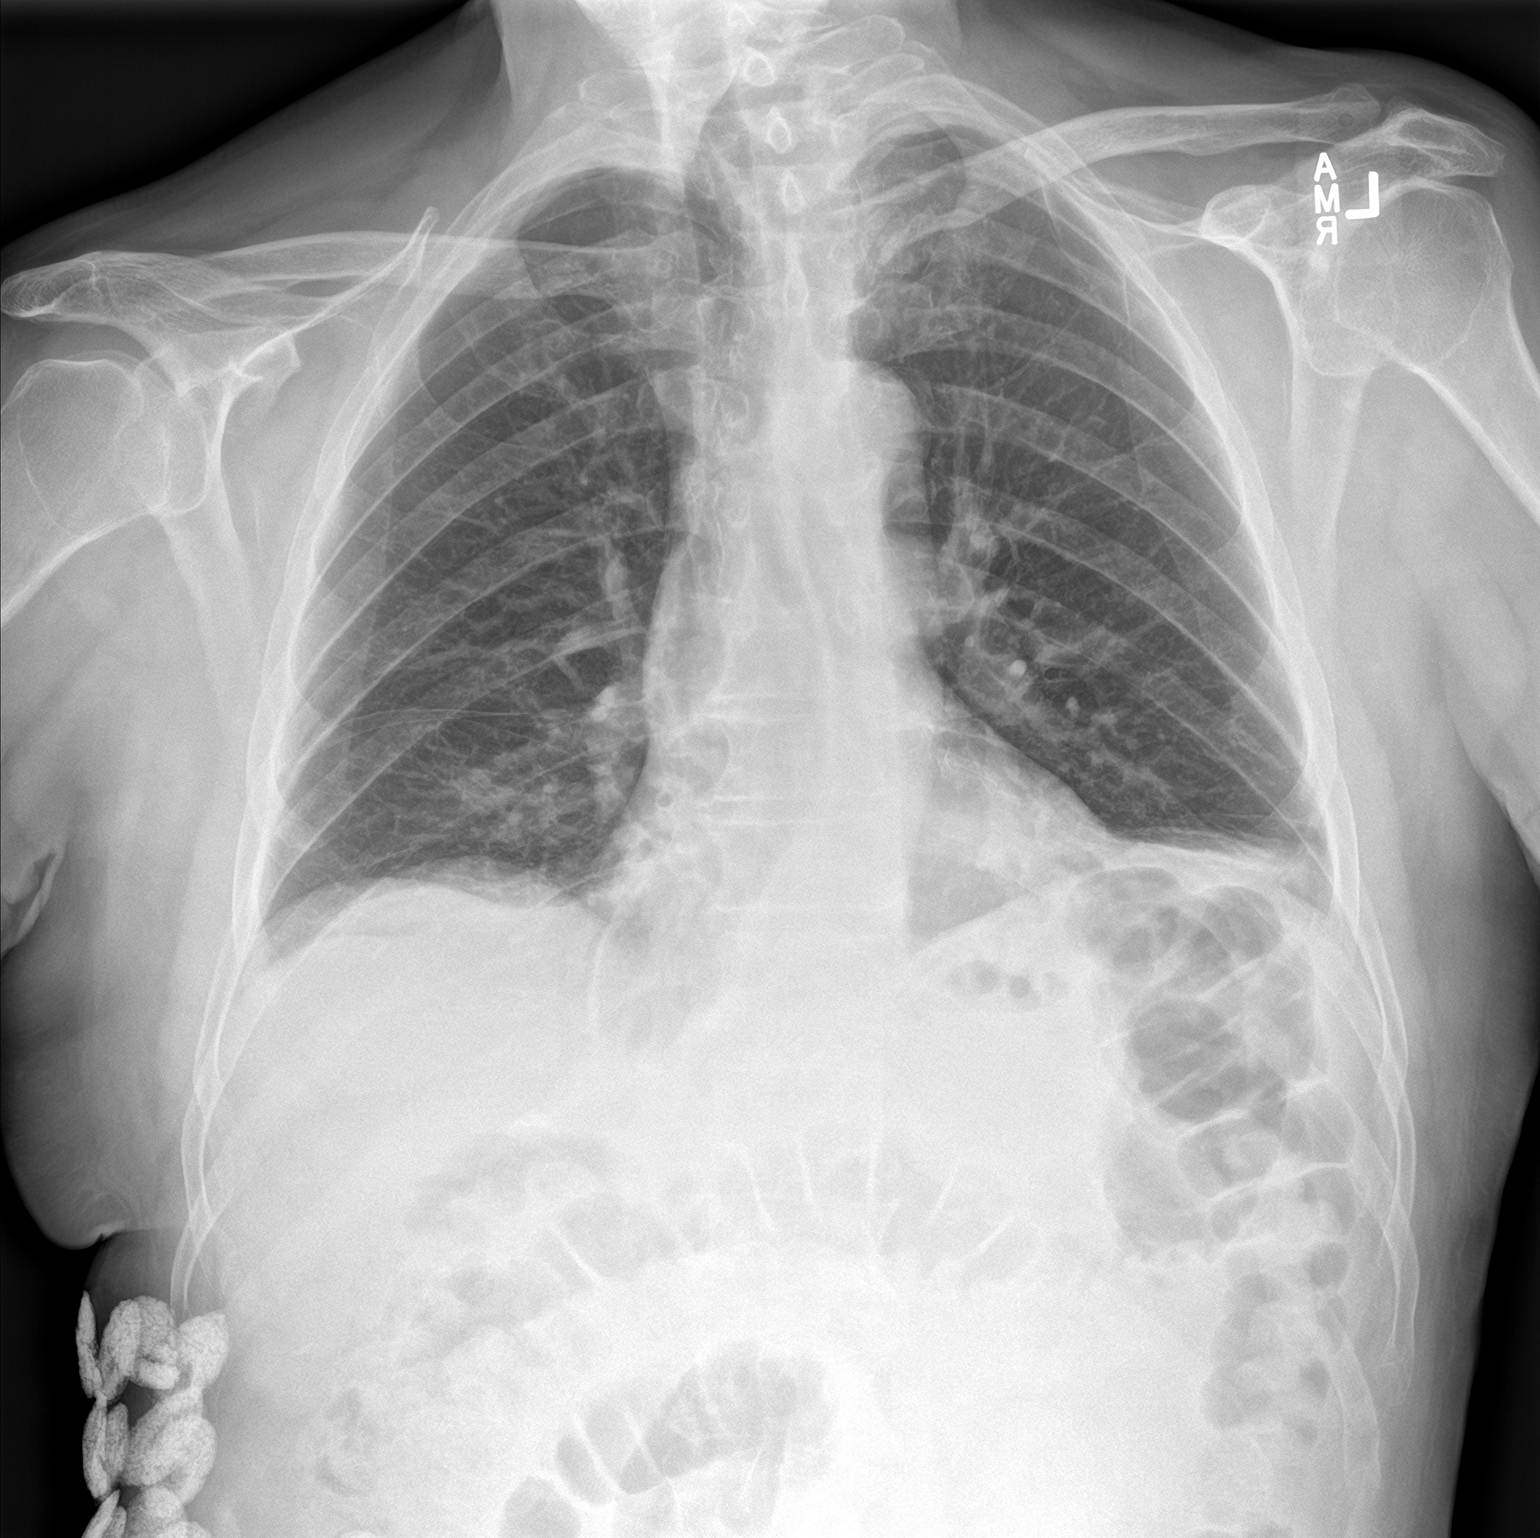

[chest lat]
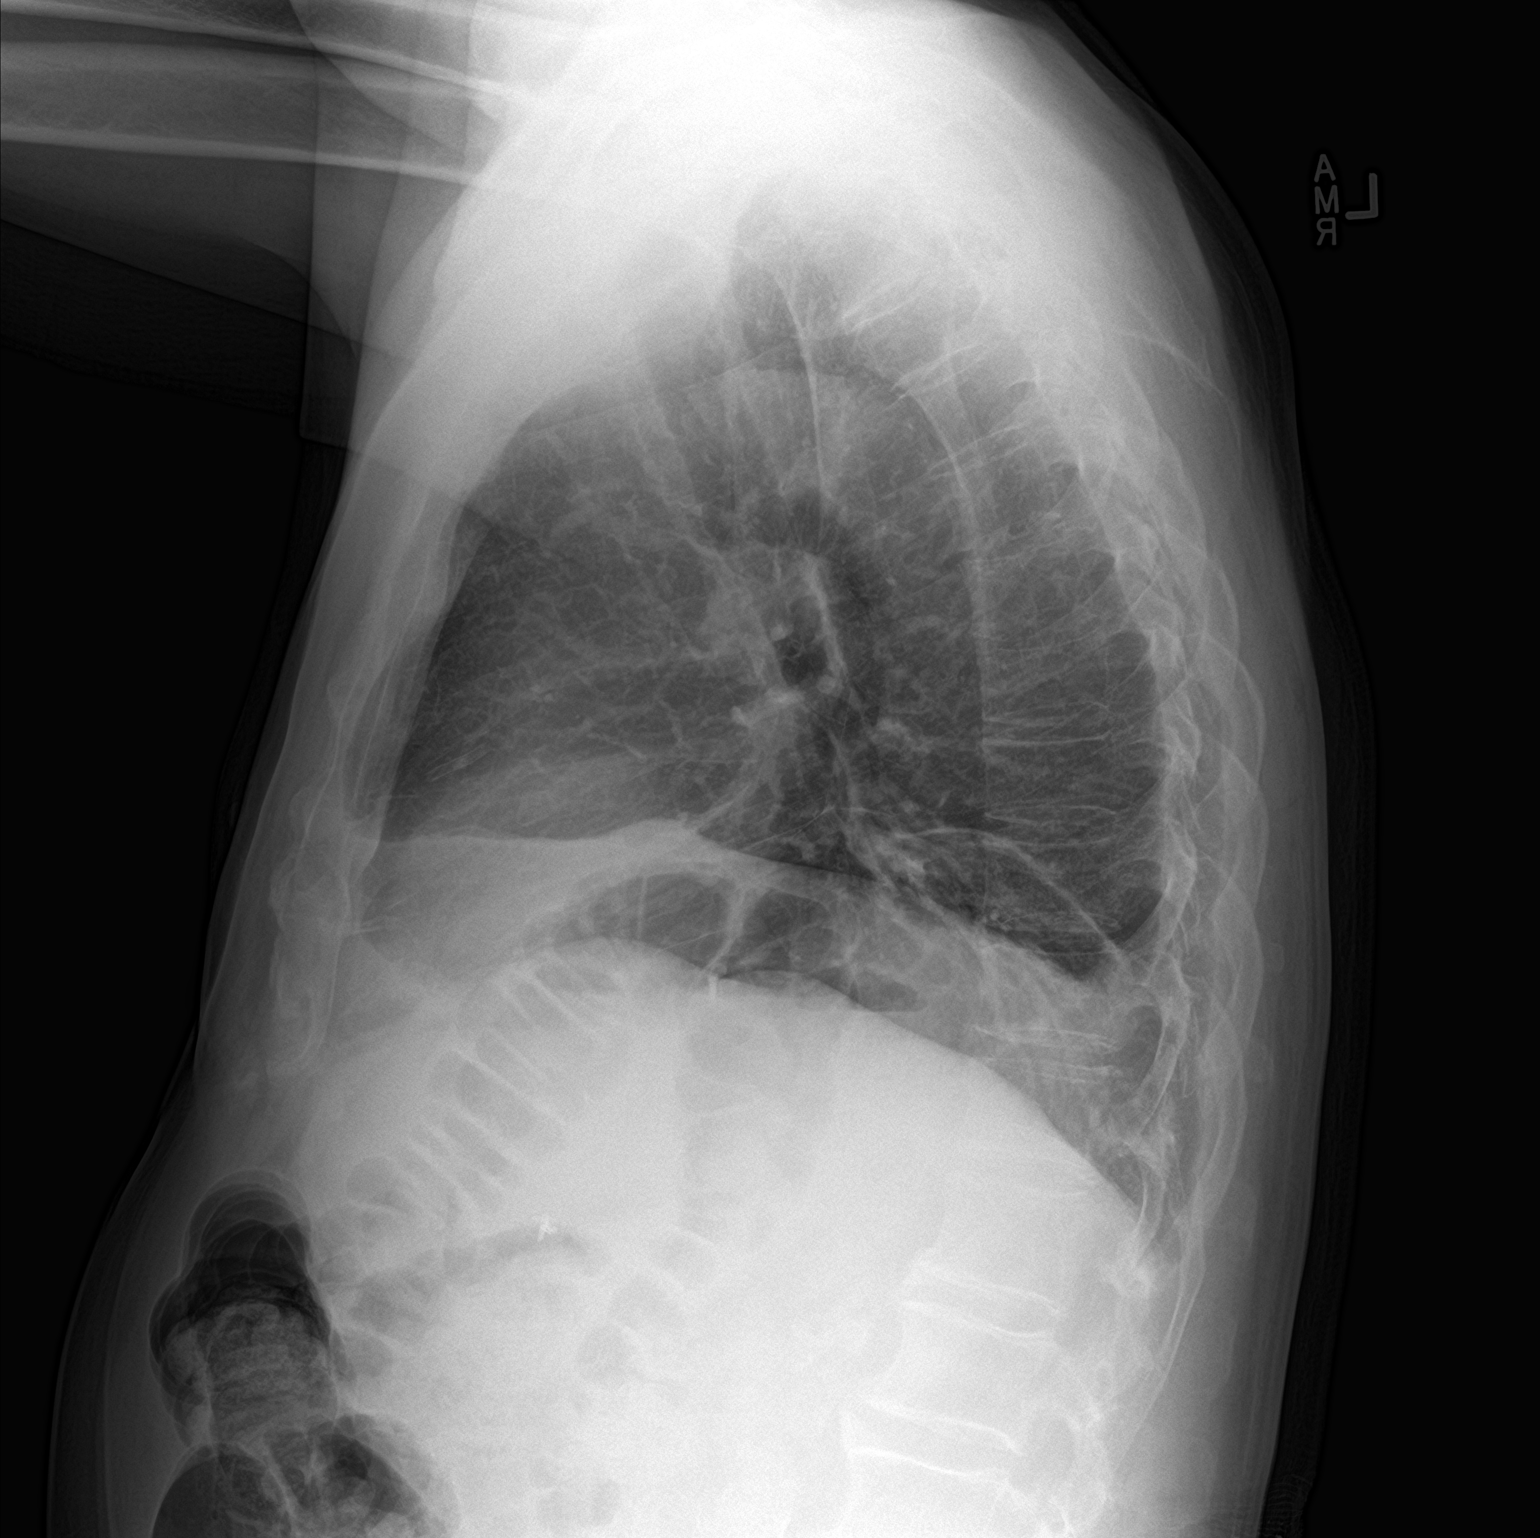

[2 of 2 positions shown; findings below may reference images not displayed]

FINDINGS: The heart size and mediastinal contours are within normal limits.
Right tenth rib fracture noted on prior CT scan is not visualized on
this study. No other rib abnormality or bony abnormality is noted.
Hypoinflation of the lungs is noted with mild bibasilar subsegmental
atelectasis. No pneumothorax is noted. No significant pleural
effusion is noted.
IMPRESSION: Hypoinflation of the lungs is noted with mild bibasilar subsegmental
atelectasis. No definite rib fractures are noted on this study.

## 2019-05-31 ENCOUNTER — Other Ambulatory Visit: Payer: Self-pay

## 2019-05-31 ENCOUNTER — Emergency Department (HOSPITAL_COMMUNITY): Payer: Medicare Other

## 2019-05-31 ENCOUNTER — Encounter (HOSPITAL_COMMUNITY): Payer: Self-pay | Admitting: *Deleted

## 2019-05-31 ENCOUNTER — Emergency Department (HOSPITAL_COMMUNITY)
Admission: EM | Admit: 2019-05-31 | Discharge: 2019-05-31 | Disposition: A | Discharge status: Home / Self Care | Payer: Medicare Other | Source: Home / Self Care | Attending: Emergency Medicine | Admitting: Emergency Medicine

## 2019-05-31 DIAGNOSIS — R0602 Shortness of breath: Secondary | ICD-10-CM | POA: Diagnosis not present

## 2019-05-31 DIAGNOSIS — J9601 Acute respiratory failure with hypoxia: Secondary | ICD-10-CM | POA: Diagnosis not present

## 2019-05-31 DIAGNOSIS — R112 Nausea with vomiting, unspecified: Secondary | ICD-10-CM | POA: Diagnosis not present

## 2019-05-31 DIAGNOSIS — J1282 Pneumonia due to coronavirus disease 2019: Secondary | ICD-10-CM | POA: Diagnosis not present

## 2019-05-31 DIAGNOSIS — K219 Gastro-esophageal reflux disease without esophagitis: Secondary | ICD-10-CM | POA: Diagnosis not present

## 2019-05-31 DIAGNOSIS — U071 COVID-19: Secondary | ICD-10-CM

## 2019-05-31 DIAGNOSIS — R0789 Other chest pain: Secondary | ICD-10-CM | POA: Insufficient documentation

## 2019-05-31 DIAGNOSIS — I2510 Atherosclerotic heart disease of native coronary artery without angina pectoris: Secondary | ICD-10-CM | POA: Insufficient documentation

## 2019-05-31 DIAGNOSIS — R197 Diarrhea, unspecified: Secondary | ICD-10-CM | POA: Insufficient documentation

## 2019-05-31 DIAGNOSIS — A0839 Other viral enteritis: Secondary | ICD-10-CM | POA: Diagnosis not present

## 2019-05-31 DIAGNOSIS — Z85828 Personal history of other malignant neoplasm of skin: Secondary | ICD-10-CM | POA: Insufficient documentation

## 2019-05-31 DIAGNOSIS — R1110 Vomiting, unspecified: Secondary | ICD-10-CM | POA: Insufficient documentation

## 2019-05-31 DIAGNOSIS — Z79899 Other long term (current) drug therapy: Secondary | ICD-10-CM | POA: Insufficient documentation

## 2019-05-31 DIAGNOSIS — I10 Essential (primary) hypertension: Secondary | ICD-10-CM | POA: Insufficient documentation

## 2019-05-31 DIAGNOSIS — Z87891 Personal history of nicotine dependence: Secondary | ICD-10-CM | POA: Insufficient documentation

## 2019-05-31 LAB — CBC WITH DIFFERENTIAL/PLATELET
Abs Immature Granulocytes: 0.02 10*3/uL (ref 0.00–0.07)
Basophils Absolute: 0 10*3/uL (ref 0.0–0.1)
Basophils Relative: 0 %
Eosinophils Absolute: 0 10*3/uL (ref 0.0–0.5)
Eosinophils Relative: 0 %
HCT: 45.6 % (ref 39.0–52.0)
Hemoglobin: 15 g/dL (ref 13.0–17.0)
Immature Granulocytes: 0 %
Lymphocytes Relative: 20 %
Lymphs Abs: 1.4 10*3/uL (ref 0.7–4.0)
MCH: 30.4 pg (ref 26.0–34.0)
MCHC: 32.9 g/dL (ref 30.0–36.0)
MCV: 92.3 fL (ref 80.0–100.0)
Monocytes Absolute: 0.5 10*3/uL (ref 0.1–1.0)
Monocytes Relative: 7 %
Neutro Abs: 5.2 10*3/uL (ref 1.7–7.7)
Neutrophils Relative %: 73 %
Platelets: 207 10*3/uL (ref 150–400)
RBC: 4.94 MIL/uL (ref 4.22–5.81)
RDW: 12.1 % (ref 11.5–15.5)
WBC: 7.1 10*3/uL (ref 4.0–10.5)
nRBC: 0 % (ref 0.0–0.2)

## 2019-05-31 LAB — COMPREHENSIVE METABOLIC PANEL
ALT: 46 U/L — ABNORMAL HIGH (ref 0–44)
AST: 49 U/L — ABNORMAL HIGH (ref 15–41)
Albumin: 4.1 g/dL (ref 3.5–5.0)
Alkaline Phosphatase: 69 U/L (ref 38–126)
Anion gap: 15 (ref 5–15)
BUN: 26 mg/dL — ABNORMAL HIGH (ref 8–23)
CO2: 22 mmol/L (ref 22–32)
Calcium: 9.3 mg/dL (ref 8.9–10.3)
Chloride: 100 mmol/L (ref 98–111)
Creatinine, Ser: 1.04 mg/dL (ref 0.61–1.24)
GFR calc Af Amer: >60 mL/min (ref >60)
GFR calc non Af Amer: >60 mL/min (ref >60)
Glucose, Bld: 113 mg/dL — ABNORMAL HIGH (ref 70–99)
Potassium: 3.8 mmol/L (ref 3.5–5.1)
Sodium: 137 mmol/L (ref 135–145)
Total Bilirubin: 0.9 mg/dL (ref 0.3–1.2)
Total Protein: 8 g/dL (ref 6.5–8.1)

## 2019-05-31 LAB — TRIGLYCERIDES: Triglycerides: 89 mg/dL (ref <150)

## 2019-05-31 LAB — POC SARS CORONAVIRUS 2 AG -  ED: SARS Coronavirus 2 Ag: POSITIVE — AB

## 2019-05-31 LAB — LACTIC ACID, PLASMA
Lactic Acid, Venous: 1.8 mmol/L (ref 0.5–1.9)
Lactic Acid, Venous: 1.6 mmol/L (ref 0.5–1.9)

## 2019-05-31 LAB — FERRITIN: Ferritin: 306 ng/mL (ref 24–336)

## 2019-05-31 LAB — LACTATE DEHYDROGENASE: LDH: 148 U/L (ref 98–192)

## 2019-05-31 LAB — TROPONIN I (HIGH SENSITIVITY)
Troponin I (High Sensitivity): <2 ng/L
Troponin I (High Sensitivity): 2 ng/L (ref <18)

## 2019-05-31 LAB — C-REACTIVE PROTEIN: CRP: 2.8 mg/dL — ABNORMAL HIGH

## 2019-05-31 LAB — FIBRINOGEN: Fibrinogen: 490 mg/dL — ABNORMAL HIGH (ref 210–475)

## 2019-05-31 LAB — PROCALCITONIN: Procalcitonin: <0.1 ng/mL

## 2019-05-31 LAB — D-DIMER, QUANTITATIVE: D-Dimer, Quant: 0.66 ug/mL-FEU — ABNORMAL HIGH (ref 0.00–0.50)

## 2019-05-31 MED ORDER — SODIUM CHLORIDE 0.9 % IV SOLN: INTRAVENOUS | Status: DC

## 2019-05-31 MED ORDER — ONDANSETRON 4 MG PO TBDP: 4.0000 mg | ORAL_TABLET | Freq: Three times a day (TID) | ORAL | 0 refills | Status: DC | PRN

## 2019-05-31 MED ORDER — PROMETHAZINE HCL 25 MG PO TABS: 25.0000 mg | ORAL_TABLET | Freq: Four times a day (QID) | ORAL | 0 refills | Status: DC | PRN

## 2019-05-31 MED ORDER — ONDANSETRON HCL 4 MG/2ML IJ SOLN
4.0000 mg | Freq: Once | INTRAMUSCULAR | Status: AC
  Administered 2019-05-31: 12:00:00 4 mg via INTRAVENOUS
  Filled 2019-05-31: qty 2

## 2019-05-31 NOTE — ED Provider Notes (Addendum)
[facility] EMERGENCY DEPARTMENT Provider Note   CSN: [id number] Arrival date & time: 05/31/19  1120     History Chief Complaint  Patient presents with  . Emesis  . Diarrhea  . Shortness of Breath    Brandon [name] is a 74 y.o. male.  HPI    74 year old male, known history of coronary disease with multiple coronary stents, history of acid reflux and hypertension.  He was exposed to multiple family members with COVID-19 this week, he and his wife have both presented today with an illness.  He states that his illness is a combination of increasing coughing in fact he has been coughing so much that he started to have chest pain with coughing.  He is feeling short of breath especially with exertion and he has had persistent vomiting and diarrhea over the last couple of days.  This is gradually worsening and because his symptoms became severe today he seeks a medical exam.  He denies fevers or chills at this time.  He has not been able to eat or drink anything today.  Past Medical History:  Diagnosis Date  . Arthritis   . Blood transfusion    no trouble-51 years ago  . CAD S/P percutaneous coronary angioplasty: DES PCI to mLAD & OM1 04/03/2014   a. Lesion #1: (OM 1 90%) Promus Premier DES 2.25 x 12 mm (2.5 mm)  Lesion #2: (mid LAD 70% ) Promus Premier DES 2.5 x 16 mm  (2.75 mm)   . Cancer (HCC)    Skin  . Chronic headaches   . Chronic prostatitis   . Diverticulitis   . GERD (gastroesophageal reflux disease)   . Hematuria   . Hernia of other specified sites of abdominal cavity without mention of obstruction or gangrene   . Hypertension   . Other specified forms of hearing loss   . PONV (postoperative nausea and vomiting)   . Shortness of breath    with exertion    Patient Active Problem List   Diagnosis Date Noted  . Hyperlipidemia 04/14/2014  . Unstable angina ([place]) 04/03/2014  . CAD S/P percutaneous coronary angioplasty: DES PCI to mLAD & OM1 04/03/2014    Class: Status  post  . Right carotid bruit 11/14/2012  . HTN (hypertension) 08/02/2012  . Chest pain 06/21/2012  . Fatigue 06/21/2012  . Dyspnea 06/21/2012    Past Surgical History:  Procedure Laterality Date  . CHOLECYSTECTOMY     aph-15 yrs ago0-[facility]  . CORONARY STENT PLACEMENT  04/03/2014   DES  OMI  &  LAD  . 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 INGUINAL HERNIA REPAIR  04/13/2011   Procedure: HERNIA REPAIR INGUINAL ADULT;  Surgeon: [name];  Location: [facility];  Service: General;  Laterality: Right;  . LEFT HEART CATHETERIZATION WITH CORONARY ANGIOGRAM N/A 04/03/2014   Procedure: LEFT HEART CATHETERIZATION WITH CORONARY ANGIOGRAM;  Surgeon: [name], MD;  Location: [facility] CATH LAB;  Service: Cardiovascular;  Laterality: N/A;  . PERCUTANEOUS CORONARY STENT INTERVENTION (PCI-S)  04/03/2014   Procedure: PERCUTANEOUS CORONARY STENT INTERVENTION (PCI-S);  Surgeon: [name], MD;  Location: [facility] CATH LAB;  Service: Cardiovascular;;  . RECONSTRUCTION MID-FACE     left face       Family History  Problem Relation Age of Onset  . Heart attack Mother   . Heart disease Mother   .  Hypertension Mother   . Heart attack Father   . Heart disease Father   . Hypertension Father     Social History   Tobacco Use  . Smoking status: Former Smoker    Packs/day: 2.00    Years: 14.00    Pack years: 28.00    Types: Cigarettes    Quit date: 04/10/1969    Years since quitting: 50.1  . Smokeless tobacco: Never Used  Substance Use Topics  . Alcohol use: No  . Drug use: No    Home Medications Prior to Admission medications   Medication Sig Start Date End Date Taking? Authorizing Provider  amLODipine (NORVASC) 10 MG tablet Take 1 tablet (10 mg total) by mouth daily. 02/22/18 05/31/19 Yes Josue Hector, MD  atorvastatin (LIPITOR) 80 MG tablet TAKE ONE TABLET BY MOUTH DAILY AT 6 PM. 03/04/19  Yes Josue Hector, MD  butalbital-acetaminophen-caffeine (FIORICET, ESGIC) 50-325-40 MG per tablet Take 1 tablet by mouth every 4 (four) hours as needed for headache.   Yes [provider]  esomeprazole (NEXIUM) 20 MG capsule Take 1 caplsule by mouth before breakfast 04/09/14  Yes Josue Hector, MD  NITROSTAT 0.4 MG SL tablet PLACE 1 TABLET UNDER TONGUE FOR CHEST PAIN. MAY REPEAT EVERY 5 MIN UPTO 3 DOSES-NO RELIEF,CALL 911. 02/24/17  Yes Josue Hector, MD  ondansetron (ZOFRAN ODT) 4 MG disintegrating tablet Take 1 tablet (4 mg total) by mouth every 8 (eight) hours as needed for nausea. 05/31/19   Noemi Chapel, MD  promethazine (PHENERGAN) 25 MG tablet Take 1 tablet (25 mg total) by mouth every 6 (six) hours as needed for nausea or vomiting. 05/31/19   Noemi Chapel, MD    Allergies    Ciprofloxacin  Review of Systems   Review of Systems  All other systems reviewed and are negative.   Physical Exam Updated Vital Signs BP 135/75   Pulse 69   Temp 98.6 F (37 C)   Resp (!) 23   SpO2 92%   Physical Exam Vitals and nursing note reviewed.  Constitutional:      General: He is not in acute distress.    Appearance: He is well-developed.  HENT:     Head: Normocephalic and atraumatic.     Mouth/Throat:     Pharynx: No oropharyngeal exudate.  Eyes:     General: No scleral icterus.       Right eye: No discharge.        Left eye: No discharge.     Conjunctiva/sclera: Conjunctivae normal.     Pupils: Pupils are equal, round, and reactive to light.     Comments: Disconjugate gaze  Neck:     Thyroid: No thyromegaly.     Vascular: No JVD.  Cardiovascular:     Rate and Rhythm: Normal rate and regular rhythm.     Heart sounds: Normal heart sounds. No murmur. No friction rub. No gallop.   Pulmonary:     Effort: Pulmonary effort is normal. No respiratory distress.     Breath sounds: Normal breath sounds. No wheezing or rales.  Abdominal:     General: Bowel sounds are normal. There is no  distension.     Palpations: Abdomen is soft. There is no mass.     Tenderness: There is abdominal tenderness.     Comments: Very soft abdomen, mild diffuse tenderness, no guarding or peritoneal signs  Musculoskeletal:        General: No tenderness. Normal range of motion.  Cervical back: Normal range of motion and neck supple.  Lymphadenopathy:     Cervical: No cervical adenopathy.  Skin:    General: Skin is warm and dry.     Findings: No erythema or rash.  Neurological:     Mental Status: He is alert.     Coordination: Coordination normal.  Psychiatric:        Behavior: Behavior normal.     ED Results / Procedures / Treatments   Labs (all labs ordered are listed, but only abnormal results are displayed) Labs Reviewed  COMPREHENSIVE METABOLIC PANEL - Abnormal; Notable for the following components:      Result Value   Glucose, Bld 113 (*)    BUN 26 (*)    AST 49 (*)    ALT 46 (*)    All other components within normal limits  D-DIMER, QUANTITATIVE (NOT AT [facility]) - Abnormal; Notable for the following components:   D-Dimer, Quant 0.66 (*)    All other components within normal limits  FIBRINOGEN - Abnormal; Notable for the following components:   Fibrinogen 490 (*)    All other components within normal limits  C-REACTIVE PROTEIN - Abnormal; Notable for the following components:   CRP 2.8 (*)    All other components within normal limits  POC SARS CORONAVIRUS 2 AG -  ED - Abnormal; Notable for the following components:   SARS Coronavirus 2 Ag POSITIVE (*)    All other components within normal limits  CULTURE, BLOOD (ROUTINE X 2)  CULTURE, BLOOD (ROUTINE X 2)  LACTIC ACID, PLASMA  CBC WITH DIFFERENTIAL/PLATELET  PROCALCITONIN  LACTATE DEHYDROGENASE  FERRITIN  TRIGLYCERIDES  LACTIC ACID, PLASMA  TROPONIN I (HIGH SENSITIVITY)  TROPONIN I (HIGH SENSITIVITY)    EKG EKG Interpretation  Date/Time:  Friday May 31 2019 12:26:56 EST Ventricular Rate:  68 PR  Interval:    QRS Duration: 106 QT Interval:  398 QTC Calculation: 424 R Axis:   69 Text Interpretation: Sinus rhythm Low voltage, precordial leads unchanged, no ischemia Confirmed by Noemi Chapel [phone]) on 05/31/2019 12:39:46 PM   Radiology DG Chest Port 1 View  Result Date: 05/31/2019 CLINICAL DATA:  Vomiting and diarrhea. Shortness of breath and chest pain EXAM: PORTABLE CHEST 1 VIEW COMPARISON:  01/09/2018. FINDINGS: The heart size appears within normal limits. No pleural or pericardial effusion identified. No airspace opacities identified. Visualized osseous structures are unremarkable IMPRESSION: No acute cardiopulmonary abnormalities Electronically Signed   By: Kerby Moors M.D.   On: 05/31/2019 12:24    Procedures Procedures (including critical care time)  Medications Ordered in ED Medications  0.9 %  sodium chloride infusion ( Intravenous New Bag/Given 05/31/19 1221)  ondansetron (ZOFRAN) injection 4 mg (4 mg Intravenous Given 05/31/19 1221)    ED Course  I have reviewed the triage vital signs and the nursing notes.  Pertinent labs & imaging results that were available during my care of the patient were reviewed by me and considered in my medical decision making (see chart for details).  Clinical Course as of May 31 1415  Fri May 31, 2019  1414 This patient's laboratory work-up is remarkable for a positive Covid sample, negative troponin and EKG, metabolic panel which shows only a very slight hint of a transaminitis with no significant renal dysfunction, no elevation of the lactic acid and no leukocytosis.  There is no signs of pneumonia on the x-ray and the patient is breathing well without hypoxia.  After receiving antiemetics and fluids he feels  better though he is still nauseated.  He is willing to go home with antiemetics.  He is agreeable to the plan and understands the indications for return including worsening symptoms or hypoxia shortness of breath dyspnea chest pain or  persistent nausea vomiting   [BM]    Clinical Course User Index [BM] Noemi Chapel, MD   MDM Rules/Calculators/A&P                      The patient appears uncomfortable, he is leaning over an emesis bag persistently vomiting.  He has clear lung sounds and oxygen seems to be between 93 and 96%.  He speaks in full sentences.  He is afebrile and normotensive.  The patient is progressively more symptomatic and will need testing and evaluation for Covid.  He is agreeable, he will get IV fluids, antiemetics and evaluation for his heart given his chest pain.  The patient had a successful p.o. trial prior to discharge  KEEAN HILYARD was evaluated in Emergency Department on 05/31/2019 for the symptoms described in the history of present illness. He was evaluated in the context of the global COVID-19 pandemic, which necessitated consideration that the patient might be at risk for infection with the SARS-CoV-2 virus that causes COVID-19. Institutional protocols and algorithms that pertain to the evaluation of patients at risk for COVID-19 are in a state of rapid change based on information released by regulatory bodies including the CDC and federal and state organizations. These policies and algorithms were followed during the patient's care in the ED.   Final Clinical Impression(s) / ED Diagnoses Final diagnoses:  COVID-19    Rx / DC Orders ED Discharge Orders         Ordered    ondansetron (ZOFRAN ODT) 4 MG disintegrating tablet  Every 8 hours PRN     05/31/19 1415    promethazine (PHENERGAN) 25 MG tablet  Every 6 hours PRN     05/31/19 1415           Noemi Chapel, MD 05/31/19 1417    Noemi Chapel, MD 05/31/19 [phone]

## 2019-05-31 NOTE — Discharge Instructions (Signed)
You have been diagnosed with coronavirus.  Fortunately you do not have any signs of pneumonia and your blood work was reassuring.  You may find that you have ongoing symptoms including coughing, shortness of breath, chest pain, nausea, vomiting, diarrhea or generalized fatigue, fevers, aches or headache.  All of these are common symptoms.  If you should become more symptomatic or severely symptomatic such that you are not able to tolerate food fluids or medications or if your shortness of breath worsens or in general if you are concerned about your condition you should return to the emergency department immediately.  Otherwise please follow-up with your doctor in 1 week.  You will be contagious for a total of at minimum 10 days from the first day of your symptoms and can only come out of quarantine if you have no fever for 24 hours without medications and are improving with regards to your symptoms.  Occasionally symptoms will last longer, sometimes even up to a month so please drink plenty of clear liquids and use the following medications as needed for nausea  \ Zofran, 1 tablet dissolvable on your tongue every 8 hours as needed for nausea Phenergan, 25 mg by mouth every 6 hours as needed for nausea if the Zofran is not working.  Please be aware that Phenergan may make you's very sleepy and you should not drive a vehicle or operate heavy machinery if you are taking this medication.

## 2019-05-31 NOTE — ED Triage Notes (Signed)
C/o vomiting and diarrhea, shortness of breath exposed to covid

## 2019-05-31 NOTE — ED Notes (Signed)
Pt returned call to er requesting prescription to be sent to another pharmacy Dr Sedonia Small notified and changed prescription to paper, prescriptions printed and given to pt

## 2019-05-31 NOTE — ED Notes (Signed)
Pt given sprite 

## 2019-05-31 NOTE — ED Notes (Signed)
Pt states sprite is too sweet. Pt given glass of water.

## 2019-06-01 ENCOUNTER — Other Ambulatory Visit: Payer: Self-pay | Admitting: Unknown Physician Specialty

## 2019-06-01 ENCOUNTER — Telehealth: Payer: Self-pay | Admitting: Unknown Physician Specialty

## 2019-06-01 NOTE — Telephone Encounter (Signed)
  I connected by phone with Brandon [name] on 06/01/2019 at 11:06 AM to discuss the potential use of an new treatment for mild to moderate COVID-19 viral infection in non-hospitalized patients.  This patient is a 74 y.o. male that meets the FDA criteria for Emergency Use Authorization of bamlanivimab or casirivimab\imdevimab.  Has a (+) direct SARS-CoV-2 viral test result  Has mild or moderate COVID-19   Is ? 74 years of age and weighs ? 40 kg  Is NOT hospitalized due to COVID-19  Is NOT requiring oxygen therapy or requiring an increase in baseline oxygen flow rate due to COVID-19  Is within 10 days of symptom onset  Has at least one of the high risk factor(s) for progression to severe COVID-19 and/or hospitalization as defined in EUA.  Specific high risk criteria : >/= 76 yo with co-morbid conditions   I have spoken and communicated the following to the patient or parent/caregiver:  1. FDA has authorized the emergency use of bamlanivimab and casirivimab\imdevimab for the treatment of mild to moderate COVID-19 in adults and pediatric patients with positive results of direct SARS-CoV-2 viral testing who are 20 years of age and older weighing at least 40 kg, and who are at high risk for progressing to severe COVID-19 and/or hospitalization.  2. The significant known and potential risks and benefits of bamlanivimab and casirivimab\imdevimab, and the extent to which such potential risks and benefits are unknown.  3. Information on available alternative treatments and the risks and benefits of those alternatives, including clinical trials.  4. Patients treated with bamlanivimab and casirivimab\imdevimab should continue to self-isolate and use infection control measures (e.g., wear mask, isolate, social distance, avoid sharing personal items, clean and disinfect "high touch" surfaces, and frequent handwashing) according to CDC guidelines.   5. The patient or parent/caregiver has the option to  accept or refuse bamlanivimab or casirivimab\imdevimab .  After reviewing this information with the patient, The patient agreed to proceed with receiving the casirivimab\imdevimab infusion and will be provided a copy of the Fact sheet prior to receiving the infusion.Brandon [name] 06/01/2019 11:06 AM  Symptom onset 12/27

## 2019-06-03 ENCOUNTER — Ambulatory Visit (HOSPITAL_COMMUNITY): Payer: Medicare Other

## 2019-06-03 ENCOUNTER — Other Ambulatory Visit: Payer: Self-pay

## 2019-06-03 ENCOUNTER — Encounter (HOSPITAL_COMMUNITY): Payer: Self-pay | Admitting: Emergency Medicine

## 2019-06-03 ENCOUNTER — Inpatient Hospital Stay (HOSPITAL_COMMUNITY)
Admission: EM | Admit: 2019-06-03 | Discharge: 2019-06-06 | DRG: 177 | Disposition: A | Discharge status: Home / Self Care | Payer: Medicare Other | Attending: Internal Medicine | Admitting: Internal Medicine

## 2019-06-03 ENCOUNTER — Emergency Department (HOSPITAL_COMMUNITY): Payer: Medicare Other

## 2019-06-03 DIAGNOSIS — N411 Chronic prostatitis: Secondary | ICD-10-CM | POA: Diagnosis present

## 2019-06-03 DIAGNOSIS — U071 COVID-19: Secondary | ICD-10-CM

## 2019-06-03 DIAGNOSIS — J9601 Acute respiratory failure with hypoxia: Secondary | ICD-10-CM | POA: Diagnosis present

## 2019-06-03 DIAGNOSIS — R0609 Other forms of dyspnea: Secondary | ICD-10-CM

## 2019-06-03 DIAGNOSIS — Z9049 Acquired absence of other specified parts of digestive tract: Secondary | ICD-10-CM

## 2019-06-03 DIAGNOSIS — R0602 Shortness of breath: Secondary | ICD-10-CM | POA: Diagnosis not present

## 2019-06-03 DIAGNOSIS — Z87891 Personal history of nicotine dependence: Secondary | ICD-10-CM | POA: Diagnosis not present

## 2019-06-03 DIAGNOSIS — R112 Nausea with vomiting, unspecified: Secondary | ICD-10-CM

## 2019-06-03 DIAGNOSIS — I10 Essential (primary) hypertension: Secondary | ICD-10-CM | POA: Diagnosis present

## 2019-06-03 DIAGNOSIS — E860 Dehydration: Secondary | ICD-10-CM | POA: Diagnosis present

## 2019-06-03 DIAGNOSIS — Z881 Allergy status to other antibiotic agents status: Secondary | ICD-10-CM | POA: Diagnosis not present

## 2019-06-03 DIAGNOSIS — A0839 Other viral enteritis: Secondary | ICD-10-CM | POA: Diagnosis not present

## 2019-06-03 DIAGNOSIS — K219 Gastro-esophageal reflux disease without esophagitis: Secondary | ICD-10-CM | POA: Diagnosis present

## 2019-06-03 DIAGNOSIS — E785 Hyperlipidemia, unspecified: Secondary | ICD-10-CM | POA: Diagnosis present

## 2019-06-03 DIAGNOSIS — H9190 Unspecified hearing loss, unspecified ear: Secondary | ICD-10-CM | POA: Diagnosis present

## 2019-06-03 DIAGNOSIS — J1282 Pneumonia due to coronavirus disease 2019: Secondary | ICD-10-CM | POA: Diagnosis present

## 2019-06-03 DIAGNOSIS — Z955 Presence of coronary angioplasty implant and graft: Secondary | ICD-10-CM

## 2019-06-03 DIAGNOSIS — Z79899 Other long term (current) drug therapy: Secondary | ICD-10-CM | POA: Diagnosis not present

## 2019-06-03 DIAGNOSIS — R197 Diarrhea, unspecified: Secondary | ICD-10-CM | POA: Diagnosis not present

## 2019-06-03 DIAGNOSIS — I2510 Atherosclerotic heart disease of native coronary artery without angina pectoris: Secondary | ICD-10-CM | POA: Diagnosis present

## 2019-06-03 DIAGNOSIS — Z8249 Family history of ischemic heart disease and other diseases of the circulatory system: Secondary | ICD-10-CM | POA: Diagnosis not present

## 2019-06-03 HISTORY — DX: COVID-19: U07.1

## 2019-06-03 LAB — FIBRINOGEN: Fibrinogen: 593 mg/dL — ABNORMAL HIGH (ref 210–475)

## 2019-06-03 LAB — COMPREHENSIVE METABOLIC PANEL
ALT: 40 U/L (ref 0–44)
AST: 46 U/L — ABNORMAL HIGH (ref 15–41)
Albumin: 4.1 g/dL (ref 3.5–5.0)
Alkaline Phosphatase: 75 U/L (ref 38–126)
Anion gap: 15 (ref 5–15)
BUN: 30 mg/dL — ABNORMAL HIGH (ref 8–23)
CO2: 20 mmol/L — ABNORMAL LOW (ref 22–32)
Calcium: 9.2 mg/dL (ref 8.9–10.3)
Chloride: 99 mmol/L (ref 98–111)
Creatinine, Ser: 1.14 mg/dL (ref 0.61–1.24)
GFR calc Af Amer: >60 mL/min (ref >60)
GFR calc non Af Amer: >60 mL/min (ref >60)
Glucose, Bld: 139 mg/dL — ABNORMAL HIGH (ref 70–99)
Potassium: 4.2 mmol/L (ref 3.5–5.1)
Sodium: 134 mmol/L — ABNORMAL LOW (ref 135–145)
Total Bilirubin: 1.2 mg/dL (ref 0.3–1.2)
Total Protein: 8.5 g/dL — ABNORMAL HIGH (ref 6.5–8.1)

## 2019-06-03 LAB — PROCALCITONIN: Procalcitonin: <0.1 ng/mL

## 2019-06-03 LAB — CBC
HCT: 45.9 % (ref 39.0–52.0)
Hemoglobin: 15.1 g/dL (ref 13.0–17.0)
MCH: 29.8 pg (ref 26.0–34.0)
MCHC: 32.9 g/dL (ref 30.0–36.0)
MCV: 90.7 fL (ref 80.0–100.0)
Platelets: 192 10*3/uL (ref 150–400)
RBC: 5.06 MIL/uL (ref 4.22–5.81)
RDW: 11.9 % (ref 11.5–15.5)
WBC: 6.9 10*3/uL (ref 4.0–10.5)
nRBC: 0 % (ref 0.0–0.2)

## 2019-06-03 LAB — LIPASE, BLOOD: Lipase: 32 U/L (ref 11–51)

## 2019-06-03 LAB — TROPONIN I (HIGH SENSITIVITY)
Troponin I (High Sensitivity): 2 ng/L (ref <18)
Troponin I (High Sensitivity): 3 ng/L (ref <18)

## 2019-06-03 LAB — C-REACTIVE PROTEIN: CRP: 2.8 mg/dL — ABNORMAL HIGH (ref <1.0)

## 2019-06-03 LAB — D-DIMER, QUANTITATIVE: D-Dimer, Quant: 0.92 ug/mL-FEU — ABNORMAL HIGH (ref 0.00–0.50)

## 2019-06-03 LAB — LACTIC ACID, PLASMA
Lactic Acid, Venous: 1.7 mmol/L (ref 0.5–1.9)
Lactic Acid, Venous: 1.3 mmol/L (ref 0.5–1.9)

## 2019-06-03 LAB — TRIGLYCERIDES: Triglycerides: 95 mg/dL (ref <150)

## 2019-06-03 LAB — FERRITIN: Ferritin: 524 ng/mL — ABNORMAL HIGH (ref 24–336)

## 2019-06-03 LAB — LACTATE DEHYDROGENASE: LDH: 167 U/L (ref 98–192)

## 2019-06-03 MED ORDER — HEPARIN SODIUM (PORCINE) 5000 UNIT/ML IJ SOLN
5000.0000 [IU] | Freq: Three times a day (TID) | INTRAMUSCULAR | Status: DC
  Administered 2019-06-03 – 2019-06-06 (×7): 5000 [IU] via SUBCUTANEOUS
  Filled 2019-06-03 (×7): qty 1

## 2019-06-03 MED ORDER — PROMETHAZINE HCL 25 MG/ML IJ SOLN: 12.5000 mg | Freq: Four times a day (QID) | INTRAMUSCULAR | Status: DC | PRN

## 2019-06-03 MED ORDER — ONDANSETRON 4 MG PO TBDP: 4.0000 mg | ORAL_TABLET | Freq: Three times a day (TID) | ORAL | Status: DC | PRN

## 2019-06-03 MED ORDER — ONDANSETRON HCL 4 MG/2ML IJ SOLN
4.0000 mg | Freq: Four times a day (QID) | INTRAMUSCULAR | Status: DC | PRN
  Filled 2019-06-03: qty 2

## 2019-06-03 MED ORDER — DEXAMETHASONE SODIUM PHOSPHATE 10 MG/ML IJ SOLN
10.0000 mg | Freq: Once | INTRAMUSCULAR | Status: AC
  Administered 2019-06-03: 10 mg via INTRAVENOUS
  Filled 2019-06-03: qty 1

## 2019-06-03 MED ORDER — IPRATROPIUM BROMIDE 0.02 % IN SOLN: 3.0000 mL | RESPIRATORY_TRACT | Status: DC | PRN

## 2019-06-03 MED ORDER — AMLODIPINE BESYLATE 5 MG PO TABS
10.0000 mg | ORAL_TABLET | Freq: Every day | ORAL | Status: DC
  Administered 2019-06-04 – 2019-06-06 (×3): 10 mg via ORAL
  Filled 2019-06-03 (×4): qty 2

## 2019-06-03 MED ORDER — ONDANSETRON HCL 4 MG PO TABS: 4.0000 mg | ORAL_TABLET | Freq: Four times a day (QID) | ORAL | Status: DC | PRN

## 2019-06-03 MED ORDER — DEXAMETHASONE SODIUM PHOSPHATE 10 MG/ML IJ SOLN
6.0000 mg | INTRAMUSCULAR | Status: DC
  Administered 2019-06-04 – 2019-06-06 (×3): 6 mg via INTRAVENOUS
  Filled 2019-06-03 (×3): qty 1

## 2019-06-03 MED ORDER — SODIUM CHLORIDE 0.9% FLUSH
3.0000 mL | Freq: Once | INTRAVENOUS | Status: AC
  Administered 2019-06-03: 3 mL via INTRAVENOUS

## 2019-06-03 MED ORDER — LACTATED RINGERS IV SOLN: INTRAVENOUS | Status: DC

## 2019-06-03 MED ORDER — NITROGLYCERIN 0.4 MG SL SUBL: 0.4000 mg | SUBLINGUAL_TABLET | SUBLINGUAL | Status: DC | PRN

## 2019-06-03 MED ORDER — SODIUM CHLORIDE 0.9 % IV SOLN
200.0000 mg | Freq: Once | INTRAVENOUS | Status: AC
  Administered 2019-06-03: 200 mg via INTRAVENOUS
  Filled 2019-06-03: qty 40

## 2019-06-03 MED ORDER — OXYCODONE HCL 5 MG PO TABS
5.0000 mg | ORAL_TABLET | ORAL | Status: DC | PRN
  Administered 2019-06-04: 5 mg via ORAL
  Filled 2019-06-03: qty 1

## 2019-06-03 MED ORDER — ACETAMINOPHEN 325 MG PO TABS: 650.0000 mg | ORAL_TABLET | Freq: Four times a day (QID) | ORAL | Status: DC | PRN

## 2019-06-03 MED ORDER — POLYETHYLENE GLYCOL 3350 17 G PO PACK: 17.0000 g | PACK | Freq: Every day | ORAL | Status: DC | PRN

## 2019-06-03 MED ORDER — PANTOPRAZOLE SODIUM 40 MG PO TBEC
40.0000 mg | DELAYED_RELEASE_TABLET | Freq: Every day | ORAL | Status: DC
  Administered 2019-06-03 – 2019-06-05 (×3): 40 mg via ORAL
  Filled 2019-06-03 (×3): qty 1

## 2019-06-03 MED ORDER — ATORVASTATIN CALCIUM 40 MG PO TABS
80.0000 mg | ORAL_TABLET | Freq: Every day | ORAL | Status: DC
  Administered 2019-06-04 – 2019-06-05 (×2): 80 mg via ORAL
  Filled 2019-06-03 (×3): qty 2

## 2019-06-03 MED ORDER — ALBUTEROL SULFATE HFA 108 (90 BASE) MCG/ACT IN AERS
2.0000 | INHALATION_SPRAY | Freq: Four times a day (QID) | RESPIRATORY_TRACT | Status: DC | PRN
  Filled 2019-06-03: qty 6.7

## 2019-06-03 MED ORDER — SODIUM CHLORIDE 0.9 % IV SOLN
100.0000 mg | Freq: Every day | INTRAVENOUS | Status: DC
  Administered 2019-06-04 – 2019-06-06 (×3): 100 mg via INTRAVENOUS
  Filled 2019-06-03 (×2): qty 20
  Filled 2019-06-03: qty 100
  Filled 2019-06-03: qty 20

## 2019-06-03 MED ORDER — ONDANSETRON HCL 4 MG/2ML IJ SOLN
4.0000 mg | Freq: Once | INTRAMUSCULAR | Status: AC
  Administered 2019-06-03: 4 mg via INTRAVENOUS
  Filled 2019-06-03: qty 2

## 2019-06-03 NOTE — ED Notes (Signed)
Consulted AC for Remdesivir dose. Reported would bring dose down.

## 2019-06-03 NOTE — ED Notes (Signed)
Attempted to update pt granddaughter, rachel, per pt request. No answer at contact information given. [phone])

## 2019-06-03 NOTE — ED Notes (Signed)
Pulse ox dropped to high 80%'s with ambulation and exertion.

## 2019-06-03 NOTE — ED Triage Notes (Signed)
Pt dx with covid 01-01/21.  Reports shortness of breath, back pain, decreased appetite, nauseated and chest pain

## 2019-06-03 NOTE — ED Notes (Signed)
Pt granddaughter called back and was updated on pt condition and care plan.

## 2019-06-03 NOTE — H&P (Signed)
TRH H&P    Patient Demographics:    Brandon [name], is a 74 y.o. male  MRN: [id number]  DOB - 1946-05-06  Admit Date - 06/03/2019  Referring MD/NP/PA: Dr. Laverta Baltimore  Outpatient Primary MD for the patient is Sinda Du, MD  Patient coming from: Home  Chief complaint- Shortness of breath, chest pain   HPI:    Brandon [name]  is a 74 y.o. male, with a history of hypertension, hyperlipidemia, GERD, CAD and more presents to the ED today with a chief complaint of shortness of breath and chest pain.  Patient reports that he has had shortness of breath and chest pain for 1 week.  He reports that is been getting progressively worse over the week.  The chest pain is not exertional but it is pleuritic.  It goes across both sides of his chest at the level of ribs 6.  There is no radiation of this chest pain.  He reports that its been present since before he was diagnosed with Covid on January 1.  Patient has nausea as well.  He reports an associated anorexia.  He reports that he is nauseous all day long every day.  He cannot quantify how many times he has been throwing up.  He reports no blood in his emesis.  Patient also complains of cough productive of white sputum.  He reports no hemoptysis.  Patient denies fevers and body aches.  Patient denies any sick contacts recently and reports that he has been social distancing since he got sick.  ED course Temperature 98.1, pulse 75, respiratory rate up to 31, blood pressure 147/86 satting at 97% on 2 L nasal cannula. White blood cell 6.9 BUN 30 Creatinine 1.14 BUN to creatinine ratio 26 Troponin 2 LDH 167, triglycerides 95, ferritin 524, CRP 2.8, lactic acid 1.7, pro-Cal less than 0.10, D-dimer 0.92, fibrinogen 593 Blood cultures pending Positive Covid test from January 1 Chest x-ray shows bilateral Covid pneumonia EKG shows normal sinus rhythm with a heart rate of 74, QTc of  448 Patient was given Decadron and Zofran in the ED Patient's oxygen saturations dropped to mid 80s with ambulation    Review of systems:    In addition to the HPI above,  No Fever-chills, Positive for headaches, No changes with Vision or hearing, No problems swallowing food or Liquids, Positive for pleuritic chest pain, Cough and shortness of Breath, Positive for abdominal pain associated with retching, + Nausea + Vomiting, positive for diarrhea No Blood in stool or Urine, No dysuria, No new skin rashes or bruises, No new joints pains-aches Positive for generalized weakness, tingling, numbness in any extremity, No recent weight gain or loss, No polyuria, polydypsia or polyphagia, No significant Mental Stressors.  All other systems reviewed and are negative.    Past History of the following :    Past Medical History:  Diagnosis Date  . Arthritis   . Blood transfusion    no trouble-51 years ago  . CAD S/P percutaneous coronary angioplasty: DES PCI to mLAD & OM1 04/03/2014  a. Lesion #1: (OM 1 90%) Promus Premier DES 2.25 x 12 mm (2.5 mm)  Lesion #2: (mid LAD 70% ) Promus Premier DES 2.5 x 16 mm  (2.75 mm)   . Cancer (HCC)    Skin  . Chronic headaches   . Chronic prostatitis   . Diverticulitis   . GERD (gastroesophageal reflux disease)   . Hematuria   . Hernia of other specified sites of abdominal cavity without mention of obstruction or gangrene   . Hypertension   . Other specified forms of hearing loss   . PONV (postoperative nausea and vomiting)   . Shortness of breath    with exertion      Past Surgical History:  Procedure Laterality Date  . CHOLECYSTECTOMY     aph-15 yrs ago0-jenkins  . CORONARY STENT PLACEMENT  04/03/2014   DES  OMI  &  LAD  . 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 INGUINAL HERNIA REPAIR  04/13/2011   Procedure: HERNIA REPAIR INGUINAL ADULT;  Surgeon: [name];   Location: [facility];  Service: General;  Laterality: Right;  . LEFT HEART CATHETERIZATION WITH CORONARY ANGIOGRAM N/A 04/03/2014   Procedure: LEFT HEART CATHETERIZATION WITH CORONARY ANGIOGRAM;  Surgeon: [name], MD;  Location[facility] CATH LAB;  Service: Cardiovascular;  Laterality: N/A;  . PERCUTANEOUS CORONARY STENT INTERVENTION (PCI-S)  04/03/2014   Procedure: PERCUTANEOUS CORONARY STENT INTERVENTION (PCI-S);  Surgeon: [name], MD;  Location: [facility] CATH LAB;  Service: Cardiovascular;;  . RECONSTRUCTION MID-FACE     left face      Social History:      Social History   Tobacco Use  . Smoking status: Former Smoker    Packs/day: 2.00    Years: 14.00    Pack years: 28.00    Types: Cigarettes    Quit date: 04/10/1969    Years since quitting: 50.1  . Smokeless tobacco: Never Used  Substance Use Topics  . Alcohol use: No       Family History :     Family History  Problem Relation Age of Onset  . Heart attack Mother   . Heart disease Mother   . Hypertension Mother   . Heart attack Father   . Heart disease Father   . Hypertension Father       Home Medications:   Prior to Admission medications   Medication Sig Start Date End Date Taking? Authorizing Provider  amLODipine (NORVASC) 10 MG tablet Take 1 tablet (10 mg total) by mouth daily. 02/22/18 06/03/19 Yes Josue Hector, MD  atorvastatin (LIPITOR) 80 MG tablet TAKE ONE TABLET BY MOUTH DAILY AT 6 PM. Patient taking differently: Take 80 mg by mouth daily at 6 PM.  03/04/19  Yes Josue Hector, MD  butalbital-acetaminophen-caffeine (FIORICET, ESGIC) 50-325-40 MG per tablet Take 1 tablet by mouth every 4 (four) hours as needed for headache.   Yes [provider]  esomeprazole (NEXIUM) 20 MG capsule Take 1 caplsule by mouth before breakfast 04/09/14  Yes Josue Hector, MD  NITROSTAT 0.4 MG SL tablet PLACE 1 TABLET UNDER TONGUE FOR CHEST PAIN. MAY REPEAT EVERY 5 MIN UPTO 3 DOSES-NO RELIEF,CALL  911. Patient taking differently: Place 0.4 mg under the tongue every 5 (five) minutes as needed for chest pain.  02/24/17  Yes Josue Hector, MD  ondansetron (ZOFRAN ODT) 4 MG  disintegrating tablet Take 1 tablet (4 mg total) by mouth every 8 (eight) hours as needed for nausea. 05/31/19  Yes Maudie Flakes, MD  promethazine (PHENERGAN) 25 MG tablet Take 1 tablet (25 mg total) by mouth every 6 (six) hours as needed for nausea or vomiting. 05/31/19  Yes Maudie Flakes, MD     Allergies:     Allergies  Allergen Reactions  . Ciprofloxacin Nausea And Vomiting     Physical Exam:   Vitals  Blood pressure 118/81, pulse 66, temperature 98.1 F (36.7 C), temperature source Oral, resp. rate 20, height 5\' 8"  (1.727 m), weight 77.1 kg, SpO2 90 %.  1.  General: Lying supine in bed, ill-appearing, but no acute distress  2. Psychiatric: Pleasant and cooperative with exam  3. Neurologic: A&O x4, cranial nerves II through XII grossly intact, no focal deficit on limited exam  4. HEENMT:  Head is atraumatic, normocephalic.  Pupils are reactive to light.  Extraocular muscles intact.  Trachea midline.  5. Respiratory : Lungs clear to auscultation bilaterally, no use of accessory muscles, 2 L nasal cannula in place  6. Cardiovascular : H RRR  7. Gastrointestinal:  Diffuse mild tenderness, no distention, no guarding, no Murphy sign, no Rovsing sign  8. Skin:  No acute lesions on limited skin exam  9.Musculoskeletal:  No peripheral edema    Data Review:    CBC Recent Labs  Lab 05/31/19 1157 06/03/19 1536  WBC 7.1 6.9  HGB 15.0 15.1  HCT 45.6 45.9  PLT 207 192  MCV 92.3 90.7  MCH 30.4 29.8  MCHC 32.9 32.9  RDW 12.1 11.9  LYMPHSABS 1.4  --   MONOABS 0.5  --   EOSABS 0.0  --   BASOSABS 0.0  --    ------------------------------------------------------------------------------------------------------------------  Results for orders placed or performed during the hospital  encounter of 06/03/19 (from the past 48 hour(s))  CBC     Status: None   Collection Time: 06/03/19  3:36 PM  Result Value Ref Range   WBC 6.9 4.0 - 10.5 K/uL   RBC 5.06 4.22 - 5.81 MIL/uL   Hemoglobin 15.1 13.0 - 17.0 g/dL   HCT 45.9 39.0 - 52.0 %   MCV 90.7 80.0 - 100.0 fL   MCH 29.8 26.0 - 34.0 pg   MCHC 32.9 30.0 - 36.0 g/dL   RDW 11.9 11.5 - 15.5 %   Platelets 192 150 - 400 K/uL   nRBC 0.0 0.0 - 0.2 %    Comment: Performed at [facility], [address] [place] [address], [place], [place] [address]  Troponin I (High Sensitivity)     Status: None   Collection Time: 06/03/19  3:36 PM  Result Value Ref Range   Troponin I (High Sensitivity) 2 <18 ng/L    Comment: (NOTE) Elevated high sensitivity troponin I (hsTnI) values and significant  changes across serial measurements may suggest ACS but many other  chronic and acute conditions are known to elevate hsTnI results.  Refer to the "Links" section for chest pain algorithms and additional  guidance. Performed at [facility], [address] [place], [place] [address]   Lactic acid, plasma     Status: None   Collection Time: 06/03/19  3:37 PM  Result Value Ref Range   Lactic Acid, Venous 1.7 0.5 - 1.9 mmol/L    Comment: Performed at [facility], [address] [place], [place] [address]  Comprehensive metabolic panel     Status: Abnormal   Collection Time:  06/03/19  3:37 PM  Result Value Ref Range   Sodium 134 (L) 135 - 145 mmol/L   Potassium 4.2 3.5 - 5.1 mmol/L   Chloride 99 98 - 111 mmol/L   CO2 20 (L) 22 - 32 mmol/L   Glucose, Bld 139 (H) 70 - 99 mg/dL   BUN 30 (H) 8 - 23 mg/dL   Creatinine, Ser 1.14 0.61 - 1.24 mg/dL   Calcium 9.2 8.9 - 10.3 mg/dL   Total Protein 8.5 (H) 6.5 - 8.1 g/dL   Albumin 4.1 3.5 - 5.0 g/dL   AST 46 (H) 15 - 41 U/L   ALT 40 0 - 44 U/L   Alkaline Phosphatase 75 38 - 126 U/L   Total Bilirubin 1.2 0.3 - 1.2 mg/dL   GFR calc non Af Amer >60 >60 mL/min   GFR calc Af Amer >60 >60 mL/min   Anion gap 15 5 - 15     Comment: Performed at [facility], [address], [place], [place] [address]  D-dimer, quantitative     Status: Abnormal   Collection Time: 06/03/19  3:37 PM  Result Value Ref Range   D-Dimer, Quant 0.92 (H) 0.00 - 0.50 ug/mL-FEU    Comment: (NOTE) At the manufacturer cut-off of 0.50 ug/mL FEU, this assay has been documented to exclude PE with a sensitivity and negative predictive value of 97 to 99%.  At this time, this assay has not been approved by the FDA to exclude DVT/VTE. Results should be correlated with clinical presentation. Performed at [facility], [address], [place], [place] [address]   Procalcitonin     Status: None   Collection Time: 06/03/19  3:37 PM  Result Value Ref Range   Procalcitonin <0.10 ng/mL    Comment:        Interpretation: PCT (Procalcitonin) <= 0.5 ng/mL: Systemic infection (sepsis) is not likely. Local bacterial infection is possible. (NOTE)       Sepsis PCT Algorithm           Lower Respiratory Tract                                      Infection PCT Algorithm    ----------------------------     ----------------------------         PCT < 0.25 ng/mL                PCT < 0.10 ng/mL         Strongly encourage             Strongly discourage   discontinuation of antibiotics    initiation of antibiotics    ----------------------------     -----------------------------       PCT 0.25 - 0.50 ng/mL            PCT 0.10 - 0.25 ng/mL               OR       >80% decrease in PCT            Discourage initiation of                                            antibiotics      Encourage discontinuation  of antibiotics    ----------------------------     -----------------------------         PCT >= 0.50 ng/mL              PCT 0.26 - 0.50 ng/mL               AND        <80% decrease in PCT             Encourage initiation of                                             antibiotics       Encourage continuation           of antibiotics     ----------------------------     -----------------------------        PCT >= 0.50 ng/mL                  PCT > 0.50 ng/mL               AND         increase in PCT                  Strongly encourage                                      initiation of antibiotics    Strongly encourage escalation           of antibiotics                                     -----------------------------                                           PCT <= 0.25 ng/mL                                                 OR                                        > 80% decrease in PCT                                     Discontinue / Do not initiate                                             antibiotics Performed at [facility], [address], [place], [place] [address]   Lactate dehydrogenase     Status: None   Collection Time: 06/03/19  3:37 PM  Result Value Ref Range   LDH 167 98 - 192 U/L    Comment:  Performed at [facility], [address] [place], [place] [address]  Fibrinogen     Status: Abnormal   Collection Time: 06/03/19  3:37 PM  Result Value Ref Range   Fibrinogen 593 (H) 210 - 475 mg/dL    Comment: Performed at [facility], [address], [place], [place] [address]  Lipase, blood     Status: None   Collection Time: 06/03/19  3:37 PM  Result Value Ref Range   Lipase 32 11 - 51 U/L    Comment: Performed at [facility], [address], [place], [place] [address]  Ferritin     Status: Abnormal   Collection Time: 06/03/19  3:38 PM  Result Value Ref Range   Ferritin 524 (H) 24 - 336 ng/mL    Comment: Performed at [facility], [address] [place], [place] [address]  Triglycerides     Status: None   Collection Time: 06/03/19  3:38 PM  Result Value Ref Range   Triglycerides 95 <150 mg/dL    Comment: Performed at [facility], [address] [place], [place] [address]  C-reactive protein     Status: Abnormal   Collection Time: 06/03/19  3:38 PM  Result Value Ref Range   CRP 2.8 (H) <1.0 mg/dL     Comment: Performed at [facility], [address] [place], [place] [address]    Chemistries  Recent Labs  Lab 05/31/19 1157 06/03/19 1537  NA 137 134*  K 3.8 4.2  CL 100 99  CO2 22 20*  GLUCOSE 113* 139*  BUN 26* 30*  CREATININE 1.04 1.14  CALCIUM 9.3 9.2  AST 49* 46*  ALT 46* 40  ALKPHOS 69 75  BILITOT 0.9 1.2   ------------------------------------------------------------------------------------------------------------------  ------------------------------------------------------------------------------------------------------------------ GFR: Estimated Creatinine Clearance: 54.2 mL/min (by C-G formula based on SCr of 1.14 mg/dL). Liver Function Tests: Recent Labs  Lab 05/31/19 1157 06/03/19 1537  AST 49* 46*  ALT 46* 40  ALKPHOS 69 75  BILITOT 0.9 1.2  PROT 8.0 8.5*  ALBUMIN 4.1 4.1   Recent Labs  Lab 06/03/19 1537  LIPASE 32   No results for input(s): AMMONIA in the last 168 hours. Coagulation Profile: No results for input(s): INR, PROTIME in the last 168 hours. Cardiac Enzymes: No results for input(s): CKTOTAL, CKMB, CKMBINDEX, TROPONINI in the last 168 hours. BNP (last 3 results) No results for input(s): PROBNP in the last 8760 hours. HbA1C: No results for input(s): HGBA1C in the last 72 hours. CBG: No results for input(s): GLUCAP in the last 168 hours. Lipid Profile: Recent Labs    06/03/19 1538  TRIG 95   Thyroid Function Tests: No results for input(s): TSH, T4TOTAL, FREET4, T3FREE, THYROIDAB in the last 72 hours. Anemia Panel: Recent Labs    06/03/19 1538  FERRITIN 524*    --------------------------------------------------------------------------------------------------------------- Urine analysis:    Component Value Date/Time   COLORURINE YELLOW 01/05/2018 2033   APPEARANCEUR CLEAR 01/05/2018 2033   LABSPEC >1.046 (H) 01/05/2018 2033   PHURINE 5.0 01/05/2018 2033   GLUCOSEU 50 (A) 01/05/2018 2033   HGBUR NEGATIVE 01/05/2018 2033    BILIRUBINUR NEGATIVE 01/05/2018 2033   KETONESUR NEGATIVE 01/05/2018 2033   PROTEINUR NEGATIVE 01/05/2018 2033   NITRITE POSITIVE (A) 01/05/2018 2033   LEUKOCYTESUR MODERATE (A) 01/05/2018 2033      Imaging Results:    DG Chest Port 1 View  Result Date: 06/03/2019 CLINICAL DATA:  74 year old male with COVID-19. Shortness of breath, chest and back pain. EXAM: PORTABLE CHEST 1 VIEW  COMPARISON:  05/31/2019 portable chest and earlier. FINDINGS: Portable AP upright view at 1532 hours. Subtle asymmetric interstitial and hazy opacity in the right mid lung and at the left lung base but otherwise both lungs appear clear when allowing for portable technique. Mediastinal contours remain normal. Visualized tracheal air column is within normal limits. No pneumothorax or pleural effusion. Negative visible bowel gas pattern. No acute osseous abnormality identified. IMPRESSION: Possible mild bilateral COVID-19 pneumonia but otherwise no acute cardiopulmonary abnormality. Electronically Signed   By: Genevie Ann M.D.   On: 06/03/2019 15:50    My personal review of EKG: Rhythm NSR, Rate 74 /min, QTc 448,no Acute ST changes   Assessment & Plan:    Active Problems:   COVID-19   1. Acute hypoxic respiratory failure 1. Oxygen sats dropping to mid 80s with ambulation 2. Continue O2 nasal cannula 3. Continue to monitor pulse ox with vitals 4. Albuterol and Atrovent for shortness of breath or wheezing 5. Hypoxia likely secondary to Covid infection 6. D-dimer is elevated but this is likely inflammatory reaction to Covid infection and not the result of a clot.  We will continue to monitor for worsening hypoxia, tachycardia, hypotension -low threshold for CTA 7. Serial troponins to rule out cardiac etiology of hypoxia -initial troponin 2 2. COVID-19 1. Continue Decadron 2. Start remdesivir 3. Covid precautions 4. See plan above 3. Pleuritic chest pain 1. This is not typical chest pain 2. Serial  troponins 3. EKG without ischemic changes 4. Monitor on telemetry 4. Dehydration 1. Likely secondary to poor p.o. intake, nausea, vomiting, diarrhea 2. No fluid bolus given in the setting of COVID-19 in an effort to avoid pulmonary edema 3. Maintenance fluids with LR 5. Elevated D-dimer 1. Again likely inflammatory in COVID-19 infection, and not the result of the clot 2. Trend in the a.m. 3. Low threshold for CTA/anticoagulation with any significant increase in D-dimer 6. Hypertension 1. Continue home meds 2. Near goal now at 147/86 7. Hyperlipidemia 1. Continue statin 8. GERD 1. Continue PPI therapy   DVT Prophylaxis-Heparin- SCDs   AM Labs Ordered, also please review Full Orders  Family Communication: No family at bedside Code Status:  Full  Admission status:Inpatient: Based on patients clinical presentation and evaluation of above clinical data, I have made determination that patient meets Inpatient criteria at this time.  Time spent in minutes : [place]

## 2019-06-03 NOTE — ED Provider Notes (Signed)
Emergency Department Provider Note   I have reviewed the triage vital signs and the nursing notes.   HISTORY  Chief Complaint Shortness of Breath   HPI Brandon [name] is a 74 y.o. male with PMH of CAD,, GERD, and HTN returns to the emergency department with worsening shortness of breath with associated chest pain, severe nausea, vomiting, diarrhea in the setting of known COVID-19 infection.  Patient tested positive for Covid 3 days ago on 1/1.  He states he is returned home but has had progressively worsening symptoms since that ED visit including left-sided chest tightness.  Denies pleuritic pain, radiation, modifying factors.  He is not experiencing abdominal pain but states he is not been able to keep anything down including fluids which she has been trying throughout the day.  His wife is also admitted to the hospital with COVID-19.    Past Medical History:  Diagnosis Date  . Arthritis   . Blood transfusion    no trouble-51 years ago  . CAD S/P percutaneous coronary angioplasty: DES PCI to mLAD & OM1 04/03/2014   a. Lesion #1: (OM 1 90%) Promus Premier DES 2.25 x 12 mm (2.5 mm)  Lesion #2: (mid LAD 70% ) Promus Premier DES 2.5 x 16 mm  (2.75 mm)   . Cancer (HCC)    Skin  . Chronic headaches   . Chronic prostatitis   . Diverticulitis   . GERD (gastroesophageal reflux disease)   . Hematuria   . Hernia of other specified sites of abdominal cavity without mention of obstruction or gangrene   . Hypertension   . Other specified forms of hearing loss   . PONV (postoperative nausea and vomiting)   . Shortness of breath    with exertion    Patient Active Problem List   Diagnosis Date Noted  . COVID-19 06/03/2019  . Hyperlipidemia 04/14/2014  . Unstable angina ([place]) 04/03/2014  . CAD S/P percutaneous coronary angioplasty: DES PCI to mLAD & OM1 04/03/2014    Class: Status post  . Right carotid bruit 11/14/2012  . HTN (hypertension) 08/02/2012  . Chest pain 06/21/2012  .  Fatigue 06/21/2012  . Dyspnea 06/21/2012    Past Surgical History:  Procedure Laterality Date  . CHOLECYSTECTOMY     aph-15 yrs ago0-[facility]  . CORONARY STENT PLACEMENT  04/03/2014   DES  OMI  &  LAD  . 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 INGUINAL HERNIA REPAIR  04/13/2011   Procedure: HERNIA REPAIR INGUINAL ADULT;  Surgeon: [name];  Location: [facility];  Service: General;  Laterality: Right;  . LEFT HEART CATHETERIZATION WITH CORONARY ANGIOGRAM N/A 04/03/2014   Procedure: LEFT HEART CATHETERIZATION WITH CORONARY ANGIOGRAM;  Surgeon: [name], MD;  Location: [facility] CATH LAB;  Service: Cardiovascular;  Laterality: N/A;  . PERCUTANEOUS CORONARY STENT INTERVENTION (PCI-S)  04/03/2014   Procedure: PERCUTANEOUS CORONARY STENT INTERVENTION (PCI-S);  Surgeon: [name], MD;  Location: [facility] CATH LAB;  Service: Cardiovascular;;  . RECONSTRUCTION MID-FACE     left face    Allergies Ciprofloxacin  Family History  Problem Relation Age of Onset  . Heart attack Mother   . Heart disease Mother   . Hypertension Mother   . Heart attack Father   . Heart disease Father   . Hypertension Father     Social History Social History  Tobacco Use  . Smoking status: Former Smoker    Packs/day: 2.00    Years: 14.00    Pack years: 28.00    Types: Cigarettes    Quit date: 04/10/1969    Years since quitting: 50.1  . Smokeless tobacco: Never Used  Substance Use Topics  . Alcohol use: No  . Drug use: No    Review of Systems  Constitutional: Positive fever/chills and fatigue.  Eyes: No visual changes. ENT: No sore throat. Cardiovascular: Positive chest pain. Respiratory: Positive shortness of breath. Gastrointestinal: No abdominal pain. Positive nausea, vomiting, and diarrhea.  No constipation. Genitourinary: Negative for dysuria. Musculoskeletal: Negative for back pain. Skin: Negative for  rash. Neurological: Negative for headaches, focal weakness or numbness.  10-point ROS otherwise negative.  ____________________________________________   PHYSICAL EXAM:  VITAL SIGNS: ED Triage Vitals [06/03/19 1147]  Enc Vitals Group     BP 97/86     Pulse Rate 75     Resp 20     Temp 98.1 F (36.7 C)     Temp Source Oral     SpO2 97 %     Weight 170 lb (77.1 kg)     Height 5\' 8"  (1.727 m)   Constitutional: Alert and oriented. Well appearing and in no acute distress. Eyes: Conjunctivae are normal.  Head: Atraumatic. Nose: No congestion/rhinnorhea. Mouth/Throat: Mucous membranes are slightly dry.  Neck: No stridor.   Cardiovascular: Normal rate, regular rhythm. Good peripheral circulation. Grossly normal heart sounds.   Respiratory: Normal respiratory effort.  No retractions. Lungs CTAB. Gastrointestinal: Soft with mild diffuse tenderness. No focal tenderness, rebound, or guarding. No distention.  Musculoskeletal: No lower extremity tenderness nor edema. No gross deformities of extremities. Neurologic:  Normal speech and language. No gross focal neurologic deficits are appreciated.  Skin:  Skin is warm, dry and intact. No rash noted.   ____________________________________________   LABS (all labs ordered are listed, but only abnormal results are displayed)  Labs Reviewed  COMPREHENSIVE METABOLIC PANEL - Abnormal; Notable for the following components:      Result Value   Sodium 134 (*)    CO2 20 (*)    Glucose, Bld 139 (*)    BUN 30 (*)    Total Protein 8.5 (*)    AST 46 (*)    All other components within normal limits  D-DIMER, QUANTITATIVE (NOT AT [facility]) - Abnormal; Notable for the following components:   D-Dimer, Quant 0.92 (*)    All other components within normal limits  FERRITIN - Abnormal; Notable for the following components:   Ferritin 524 (*)    All other components within normal limits  FIBRINOGEN - Abnormal; Notable for the following components:    Fibrinogen 593 (*)    All other components within normal limits  C-REACTIVE PROTEIN - Abnormal; Notable for the following components:   CRP 2.8 (*)    All other components within normal limits  CULTURE, BLOOD (ROUTINE X 2)  CULTURE, BLOOD (ROUTINE X 2)  CBC  LACTIC ACID, PLASMA  LACTIC ACID, PLASMA  PROCALCITONIN  LACTATE DEHYDROGENASE  TRIGLYCERIDES  LIPASE, BLOOD  CBC WITH DIFFERENTIAL/PLATELET  COMPREHENSIVE METABOLIC PANEL  D-DIMER, QUANTITATIVE (NOT AT [facility])  MAGNESIUM  TROPONIN I (HIGH SENSITIVITY)  TROPONIN I (HIGH SENSITIVITY)   ____________________________________________  EKG   EKG Interpretation  Date/Time:  Monday June 03 2019 11:51:13 EST Ventricular Rate:  74 PR Interval:  134 QRS Duration: 86 QT Interval:  404 QTC Calculation: 448 R Axis:  42 Text Interpretation: Normal sinus rhythm Normal ECG No STEMI Confirmed by Nanda Quinton [phone]) on 06/03/2019 3:29:58 PM       ____________________________________________  RADIOLOGY  DG Chest Port 1 View  Result Date: 06/03/2019 CLINICAL DATA:  74 year old male with COVID-19. Shortness of breath, chest and back pain. EXAM: PORTABLE CHEST 1 VIEW COMPARISON:  05/31/2019 portable chest and earlier. FINDINGS: Portable AP upright view at 1532 hours. Subtle asymmetric interstitial and hazy opacity in the right mid lung and at the left lung base but otherwise both lungs appear clear when allowing for portable technique. Mediastinal contours remain normal. Visualized tracheal air column is within normal limits. No pneumothorax or pleural effusion. Negative visible bowel gas pattern. No acute osseous abnormality identified. IMPRESSION: Possible mild bilateral COVID-19 pneumonia but otherwise no acute cardiopulmonary abnormality. Electronically Signed   By: Genevie Ann M.D.   On: 06/03/2019 15:50    ____________________________________________   PROCEDURES  Procedure(s) performed:   Procedures  CRITICAL CARE Performed  by: Margette Fast Total critical care time: 35 minutes Critical care time was exclusive of separately billable procedures and treating other patients. Critical care was necessary to treat or prevent imminent or life-threatening deterioration. Critical care was time spent personally by me on the following activities: development of treatment plan with patient and/or surrogate as well as nursing, discussions with consultants, evaluation of patient's response to treatment, examination of patient, obtaining history from patient or surrogate, ordering and performing treatments and interventions, ordering and review of laboratory studies, ordering and review of radiographic studies, pulse oximetry and re-evaluation of patient's condition.  Nanda Quinton, MD Emergency Medicine  ____________________________________________   INITIAL IMPRESSION / ASSESSMENT AND PLAN / ED COURSE  Pertinent labs & imaging results that were available during my care of the patient were reviewed by me and considered in my medical decision making (see chart for details).   Patient presents to the emergency department for evaluation of worsening COVID-19 symptoms including chest pain, shortness of breath, nausea/vomiting/diarrhea.  He has been unable to keep down solids or liquids for the past several days.  He appears moderately dehydrated and fatigued.  He does not have hypoxemia at rest.  Will ambulate as patient does seem somewhat tachypneic and occasionally has oxygen saturations into the low 90s at rest.  He has multiple medical comorbidities.  I have sent the pre-admit COVID labs and will treat with Zofran pending test results.  Labs reviewed. D-dimer slightly elevated. CP not consistent with PE clinically so will defer CTA for now. Patient desaturated to 80s with ambulation but not requiring O2 at rest. He continues to have nausea and poor PO intake. Plan for admit. Decadron ordered.   Discussed patient's case with [facility] to  request admission. Patient and family (if present) updated with plan. Care transferred to [facility] service.  I reviewed all nursing notes, vitals, pertinent old records, EKGs, labs, imaging (as available).  ____________________________________________  FINAL CLINICAL IMPRESSION(S) / ED DIAGNOSES  Final diagnoses:  Acute respiratory failure with hypoxia (HCC)  COVID-19  Nausea vomiting and diarrhea     MEDICATIONS GIVEN DURING THIS VISIT:  Medications  dexamethasone (DECADRON) injection 6 mg (has no administration in time range)  amLODipine (NORVASC) tablet 10 mg (10 mg Oral Not Given 06/03/19 2146)  atorvastatin (LIPITOR) tablet 80 mg (has no administration in time range)  nitroGLYCERIN (NITROSTAT) SL tablet 0.4 mg (has no administration in time range)  pantoprazole (PROTONIX) EC tablet 40 mg (40 mg Oral Given 06/03/19 2144)  ondansetron (ZOFRAN-ODT) disintegrating tablet 4 mg (has no administration in time range)  promethazine (PHENERGAN) injection 12.5 mg (has no administration in time range)  heparin injection 5,000 Units (5,000 Units Subcutaneous Given 06/03/19 2143)  albuterol (VENTOLIN HFA) 108 (90 Base) MCG/ACT inhaler 2 puff (has no administration in time range)  ipratropium (ATROVENT) nebulizer solution 0.6 mg (has no administration in time range)  lactated ringers infusion ( Intravenous New Bag/Given 06/03/19 2143)  acetaminophen (TYLENOL) tablet 650 mg (has no administration in time range)  oxyCODONE (Oxy IR/ROXICODONE) immediate release tablet 5 mg (has no administration in time range)  ondansetron (ZOFRAN) tablet 4 mg (has no administration in time range)    Or  ondansetron (ZOFRAN) injection 4 mg (has no administration in time range)  polyethylene glycol (MIRALAX / GLYCOLAX) packet 17 g (has no administration in time range)  remdesivir 200 mg in sodium chloride 0.9% 250 mL IVPB (0 mg Intravenous Stopped 06/03/19 2112)    Followed by  remdesivir 100 mg in sodium chloride 0.9 % 100  mL IVPB (has no administration in time range)  sodium chloride flush (NS) 0.9 % injection 3 mL (3 mLs Intravenous Given 06/03/19 1559)  ondansetron (ZOFRAN) injection 4 mg (4 mg Intravenous Given 06/03/19 1558)  dexamethasone (DECADRON) injection 10 mg (10 mg Intravenous Given 06/03/19 1717)    Note:  This document was prepared using Dragon voice recognition software and may include unintentional dictation errors.  Nanda Quinton, MD, [facility] Emergency Medicine    Wymon Swaney, [facility], MD 06/03/19 2322

## 2019-06-03 NOTE — Progress Notes (Signed)
Pharmacy Antibiotic Note  URI ARCHILLA is a 74 y.o. male admitted on 06/03/2019 with COVID-19 PNA and worsening SOB. Pt to begin remdesivir. CRP up, procal neg, LFTs wnl.  Plan: Remdesivir 200mg  IV x1 then 100mg  IV daily x4 days   Height: 5\' 8"  (172.7 cm) Weight: 170 lb (77.1 kg) IBW/kg (Calculated) : 68.4  Temp (24hrs), Avg:98.1 F (36.7 C), Min:98.1 F (36.7 C), Max:98.1 F (36.7 C)  Recent Labs  Lab 05/31/19 1157 05/31/19 1444 06/03/19 1536 06/03/19 1537  WBC 7.1  --  6.9  --   CREATININE 1.04  --   --  1.14  LATICACIDVEN 1.8 1.6  --  1.7    Estimated Creatinine Clearance: 54.2 mL/min (by C-G formula based on SCr of 1.14 mg/dL).    Allergies  Allergen Reactions  . Ciprofloxacin Nausea And Vomiting    Arrie Senate, PharmD, BCPS Clinical Pharmacist Please check AMION for all [place] numbers 06/03/2019

## 2019-06-03 NOTE — ED Notes (Signed)
Pt gave verbal permission to give Apolonio Schneiders, granddaughter, health information.

## 2019-06-04 ENCOUNTER — Inpatient Hospital Stay (HOSPITAL_COMMUNITY): Payer: Medicare Other

## 2019-06-04 DIAGNOSIS — R112 Nausea with vomiting, unspecified: Secondary | ICD-10-CM

## 2019-06-04 DIAGNOSIS — J9601 Acute respiratory failure with hypoxia: Secondary | ICD-10-CM

## 2019-06-04 DIAGNOSIS — J1282 Pneumonia due to coronavirus disease 2019: Secondary | ICD-10-CM

## 2019-06-04 DIAGNOSIS — R197 Diarrhea, unspecified: Secondary | ICD-10-CM

## 2019-06-04 DIAGNOSIS — U071 COVID-19: Secondary | ICD-10-CM

## 2019-06-04 DIAGNOSIS — A0839 Other viral enteritis: Secondary | ICD-10-CM

## 2019-06-04 HISTORY — DX: Pneumonia due to coronavirus disease 2019: J12.82

## 2019-06-04 HISTORY — DX: COVID-19: U07.1

## 2019-06-04 LAB — CBC WITH DIFFERENTIAL/PLATELET
Abs Immature Granulocytes: 0.01 10*3/uL (ref 0.00–0.07)
Basophils Absolute: 0 10*3/uL (ref 0.0–0.1)
Basophils Relative: 0 %
Eosinophils Absolute: 0 10*3/uL (ref 0.0–0.5)
Eosinophils Relative: 0 %
HCT: 41.1 % (ref 39.0–52.0)
Hemoglobin: 13.5 g/dL (ref 13.0–17.0)
Immature Granulocytes: 0 %
Lymphocytes Relative: 31 %
Lymphs Abs: 1.6 10*3/uL (ref 0.7–4.0)
MCH: 29.9 pg (ref 26.0–34.0)
MCHC: 32.8 g/dL (ref 30.0–36.0)
MCV: 90.9 fL (ref 80.0–100.0)
Monocytes Absolute: 0.3 10*3/uL (ref 0.1–1.0)
Monocytes Relative: 6 %
Neutro Abs: 3.2 10*3/uL (ref 1.7–7.7)
Neutrophils Relative %: 63 %
Platelets: 184 10*3/uL (ref 150–400)
RBC: 4.52 MIL/uL (ref 4.22–5.81)
RDW: 11.7 % (ref 11.5–15.5)
WBC: 5.1 10*3/uL (ref 4.0–10.5)
nRBC: 0 % (ref 0.0–0.2)

## 2019-06-04 LAB — COMPREHENSIVE METABOLIC PANEL
ALT: 32 U/L (ref 0–44)
AST: 35 U/L (ref 15–41)
Albumin: 3.3 g/dL — ABNORMAL LOW (ref 3.5–5.0)
Alkaline Phosphatase: 63 U/L (ref 38–126)
Anion gap: 10 (ref 5–15)
BUN: 27 mg/dL — ABNORMAL HIGH (ref 8–23)
CO2: 22 mmol/L (ref 22–32)
Calcium: 8.5 mg/dL — ABNORMAL LOW (ref 8.9–10.3)
Chloride: 104 mmol/L (ref 98–111)
Creatinine, Ser: 0.87 mg/dL (ref 0.61–1.24)
GFR calc Af Amer: >60 mL/min (ref >60)
GFR calc non Af Amer: >60 mL/min (ref >60)
Glucose, Bld: 123 mg/dL — ABNORMAL HIGH (ref 70–99)
Potassium: 4.4 mmol/L (ref 3.5–5.1)
Sodium: 136 mmol/L (ref 135–145)
Total Bilirubin: 0.8 mg/dL (ref 0.3–1.2)
Total Protein: 7.2 g/dL (ref 6.5–8.1)

## 2019-06-04 LAB — D-DIMER, QUANTITATIVE: D-Dimer, Quant: 3.34 ug/mL-FEU — ABNORMAL HIGH (ref 0.00–0.50)

## 2019-06-04 LAB — C-REACTIVE PROTEIN: CRP: 2.6 mg/dL — ABNORMAL HIGH (ref <1.0)

## 2019-06-04 LAB — FERRITIN: Ferritin: 420 ng/mL — ABNORMAL HIGH (ref 24–336)

## 2019-06-04 LAB — MAGNESIUM: Magnesium: 2.1 mg/dL (ref 1.7–2.4)

## 2019-06-04 MED ORDER — IOHEXOL 350 MG/ML SOLN
100.0000 mL | Freq: Once | INTRAVENOUS | Status: AC | PRN
  Administered 2019-06-04: 100 mL via INTRAVENOUS

## 2019-06-04 NOTE — Plan of Care (Signed)

## 2019-06-04 NOTE — Care Management Important Message (Signed)
Important Message  Patient Details  Name: Brandon [name] MRN: [id number] Date of Birth: May 19, 1946   Medicare Important Message Given:  Yes(Shannon, RN will deliver letter to patient due to ocntact precautions)     Tommy Medal 06/04/2019, 4:28 PM

## 2019-06-04 NOTE — Progress Notes (Signed)
PROGRESS NOTE  Brandon [name] [id number] DOB: 09/02/1945 DOA: 06/03/2019 PCP: Sinda Du, MD  Brief History:  74 year old male with a 1 week history of pleuritic type chest pain, shortness of breath, nausea, vomiting, and diarrhea.  The patient was tested positive for COVID-19 on 05/30/2018 when he came to the emergency department on the day.  However, his symptoms continued to progress to the point where he was unable to keep anything down.  As result, he presented again for further evaluation.  He had subjective fevers and chills and a cough with white sputum.  He denied any hematochezia, melena, hematemesis.  His wife has also been hospitalized for COVID-19 pneumonia.  In the emergency department, the patient desaturated into the 80s with ambulation.  He was started on IV steroids and remdesivir.  Assessment/Plan: Acute respiratory failure with hypoxia  -Secondary to COVID-19 pneumonia -Presently stable on 2 L nasal cannula -CRP 2.8 -Ferritin 524 -D-dimer 0.92>>> 3.34 -PCT <0.10 -LDH 167 -Fibrinogen 593 -Continue IV steroids and remdesivir -CT angiogram chest  Acute gastroenteritis -Secondary to COVID-19 infection -Diarrhea seems to be improving -Vomiting has improved but he remains nauseous -Continues to complain of abdominal pain -CT abdomen and pelvis -Continue PPI -Zofran as needed nausea vomiting  Pleuritic chest pain -Troponins unremarkable -Personally reviewed EKG--normal sinus rhythm without any ST-T wave changes -Personally reviewed chest x-ray--bilateral subtle interstitial opacities -CTA chest as discussed above  Dehydration -Continue judicious IV fluids  Essential hypertension -Continue amlodipine   Hyperlipidemia -Continue statin     Disposition Plan:   Home in 2-3 days  Family Communication:  No Family at bedside  Consultants:  none  Code Status:  FULL   DVT Prophylaxis:  [place] Heparin    Procedures: As Listed in Progress Note  Above  Antibiotics: None       Subjective: Patient continues to have some intermittent chest discomfort.  He states that his breathing is better but still has some dyspnea on exertion.  He has a cough with white sputum.  He denies any fevers, chills, headache, hemoptysis, vomiting, diarrhea.  There is no hematochezia or melena.  He is still little nauseous.  He was able to eat his breakfast without vomiting.  Objective: Vitals:   06/03/19 2300 06/04/19 0010 06/04/19 0010 06/04/19 0516  BP: 107/66 (!) 147/83 (!) 147/83 135/87  Pulse: (!) 50  (!) 51 (!) 52  Resp: 16  20 20   Temp:   98.5 F (36.9 C) 98.4 F (36.9 C)  TempSrc:   Oral Oral  SpO2: 96%  99% 95%  Weight:  66.2 kg    Height:  5\' 8"  (1.727 m)     No intake or output data in the 24 hours ending 06/04/19 0908 Weight change:  Exam:   General:  Pt is alert, follows commands appropriately, not in acute distress  HEENT: No icterus, No thrush, No neck mass, [place]/AT  Cardiovascular: RRR, S1/S2, no rubs, no gallops  Respiratory: Bibasilar rales.  No wheezing.  Good air movement.  Abdomen: Soft/+BS, non tender, non distended, no guarding  Extremities: No edema, No lymphangitis, No petechiae, No rashes, no synovitis   Data Reviewed: I have personally reviewed following labs and imaging studies Basic Metabolic Panel: Recent Labs  Lab 05/31/19 1157 06/03/19 1537 06/04/19 0612  NA 137 134* 136  K 3.8 4.2 4.4  CL 100 99 104  CO2 22 20* 22  GLUCOSE 113* 139* 123*  BUN 26* 30*  27*  CREATININE 1.04 1.14 0.87  CALCIUM 9.3 9.2 8.5*  MG  --   --  2.1   Liver Function Tests: Recent Labs  Lab 05/31/19 1157 06/03/19 1537 06/04/19 0612  AST 49* 46* 35  ALT 46* 40 32  ALKPHOS 69 75 63  BILITOT 0.9 1.2 0.8  PROT 8.0 8.5* 7.2  ALBUMIN 4.1 4.1 3.3*   Recent Labs  Lab 06/03/19 1537  LIPASE 32   No results for input(s): AMMONIA in the last 168 hours. Coagulation Profile: No results for input(s): INR, PROTIME  in the last 168 hours. CBC: Recent Labs  Lab 05/31/19 1157 06/03/19 1536 06/04/19 0612  WBC 7.1 6.9 5.1  NEUTROABS 5.2  --  3.2  HGB 15.0 15.1 13.5  HCT 45.6 45.9 41.1  MCV 92.3 90.7 90.9  PLT 207 192 184   Cardiac Enzymes: No results for input(s): CKTOTAL, CKMB, CKMBINDEX, TROPONINI in the last 168 hours. BNP: Invalid input(s): POCBNP CBG: No results for input(s): GLUCAP in the last 168 hours. HbA1C: No results for input(s): HGBA1C in the last 72 hours. Urine analysis:    Component Value Date/Time   COLORURINE YELLOW 01/05/2018 2033   APPEARANCEUR CLEAR 01/05/2018 2033   LABSPEC >1.046 (H) 01/05/2018 2033   PHURINE 5.0 01/05/2018 2033   GLUCOSEU 50 (A) 01/05/2018 2033   HGBUR NEGATIVE 01/05/2018 2033   BILIRUBINUR NEGATIVE 01/05/2018 2033   KETONESUR NEGATIVE 01/05/2018 2033   PROTEINUR NEGATIVE 01/05/2018 2033   NITRITE POSITIVE (A) 01/05/2018 2033   LEUKOCYTESUR MODERATE (A) 01/05/2018 2033   Sepsis Labs: @LABRCNTIP (procalcitonin:4,lacticidven:4) ) Recent Results (from the past 240 hour(s))  Blood Culture (routine x 2)     Status: None (Preliminary result)   Collection Time: 05/31/19 12:27 PM   Specimen: BLOOD  Result Value Ref Range Status   Specimen Description BLOOD LEFT ANTECUBITAL  Final   Special Requests   Final    BOTTLES DRAWN AEROBIC AND ANAEROBIC Blood Culture adequate volume   Culture   Final    NO GROWTH 4 DAYS Performed at [facility], [address] [place], [place] [address]    Report Status PENDING  Incomplete  Blood Culture (routine x 2)     Status: None (Preliminary result)   Collection Time: 05/31/19 12:57 PM   Specimen: BLOOD LEFT HAND  Result Value Ref Range Status   Specimen Description   Final    BLOOD LEFT HAND BOTTLES DRAWN AEROBIC AND ANAEROBIC   Special Requests Blood Culture adequate volume  Final   Culture   Final    NO GROWTH 4 DAYS Performed at [facility], [address] [place], [place] [address]    Report Status  PENDING  Incomplete  Blood Culture (routine x 2)     Status: None (Preliminary result)   Collection Time: 06/03/19  3:26 PM   Specimen: BLOOD LEFT HAND  Result Value Ref Range Status   Specimen Description BLOOD LEFT HAND  Final   Special Requests   Final    BOTTLES DRAWN AEROBIC AND ANAEROBIC Blood Culture adequate volume   Culture   Final    NO GROWTH < 24 HOURS Performed at [facility], [address], [place], [place] [address]    Report Status PENDING  Incomplete  Blood Culture (routine x 2)     Status: None (Preliminary result)   Collection Time: 06/03/19  5:33 PM   Specimen: Right Antecubital; Blood  Result Value Ref Range Status   Specimen Description RIGHT ANTECUBITAL  Final   Special Requests   Final    BOTTLES DRAWN AEROBIC AND ANAEROBIC Blood Culture adequate volume   Culture   Final    NO GROWTH < 24 HOURS Performed at [facility], [address], [place], [place] [address]    Report Status PENDING  Incomplete     Scheduled Meds: . amLODipine  10 mg Oral Daily  . atorvastatin  80 mg Oral q1800  . dexamethasone (DECADRON) injection  6 mg Intravenous Q24H  . heparin  5,000 Units Subcutaneous Q8H  . pantoprazole  40 mg Oral Daily   Continuous Infusions: . lactated ringers 75 mL/hr at 06/03/19 2143  . remdesivir 100 mg in NS 100 mL      Procedures/Studies: DG Chest Port 1 View  Result Date: 06/03/2019 CLINICAL DATA:  74 year old male with COVID-19. Shortness of breath, chest and back pain. EXAM: PORTABLE CHEST 1 VIEW COMPARISON:  05/31/2019 portable chest and earlier. FINDINGS: Portable AP upright view at 1532 hours. Subtle asymmetric interstitial and hazy opacity in the right mid lung and at the left lung base but otherwise both lungs appear clear when allowing for portable technique. Mediastinal contours remain normal. Visualized tracheal air column is within normal limits. No pneumothorax or pleural effusion. Negative visible bowel gas pattern. No acute osseous  abnormality identified. IMPRESSION: Possible mild bilateral COVID-19 pneumonia but otherwise no acute cardiopulmonary abnormality. Electronically Signed   By: Genevie Ann M.D.   On: 06/03/2019 15:50   DG Chest Port 1 View  Result Date: 05/31/2019 CLINICAL DATA:  Vomiting and diarrhea. Shortness of breath and chest pain EXAM: PORTABLE CHEST 1 VIEW COMPARISON:  01/09/2018. FINDINGS: The heart size appears within normal limits. No pleural or pericardial effusion identified. No airspace opacities identified. Visualized osseous structures are unremarkable IMPRESSION: No acute cardiopulmonary abnormalities Electronically Signed   By: Kerby Moors M.D.   On: 05/31/2019 12:24    Orson Eva, DO  [facility] Pager [phone]  If 7PM-7AM, please contact night-coverage [email or web address] Password [email or web address] 06/04/2019, 9:08 AM   LOS: 1 day

## 2019-06-05 DIAGNOSIS — K219 Gastro-esophageal reflux disease without esophagitis: Secondary | ICD-10-CM

## 2019-06-05 LAB — MAGNESIUM: Magnesium: 2.2 mg/dL (ref 1.7–2.4)

## 2019-06-05 LAB — CBC WITH DIFFERENTIAL/PLATELET
Abs Immature Granulocytes: 0.03 10*3/uL (ref 0.00–0.07)
Basophils Absolute: 0 10*3/uL (ref 0.0–0.1)
Basophils Relative: 0 %
Eosinophils Absolute: 0 10*3/uL (ref 0.0–0.5)
Eosinophils Relative: 0 %
HCT: 41.5 % (ref 39.0–52.0)
Hemoglobin: 13.7 g/dL (ref 13.0–17.0)
Immature Granulocytes: 0 %
Lymphocytes Relative: 27 %
Lymphs Abs: 2.2 10*3/uL (ref 0.7–4.0)
MCH: 29.8 pg (ref 26.0–34.0)
MCHC: 33 g/dL (ref 30.0–36.0)
MCV: 90.2 fL (ref 80.0–100.0)
Monocytes Absolute: 0.7 10*3/uL (ref 0.1–1.0)
Monocytes Relative: 8 %
Neutro Abs: 5.5 10*3/uL (ref 1.7–7.7)
Neutrophils Relative %: 65 %
Platelets: 191 10*3/uL (ref 150–400)
RBC: 4.6 MIL/uL (ref 4.22–5.81)
RDW: 11.7 % (ref 11.5–15.5)
WBC: 8.4 10*3/uL (ref 4.0–10.5)
nRBC: 0 % (ref 0.0–0.2)

## 2019-06-05 LAB — COMPREHENSIVE METABOLIC PANEL
ALT: 28 U/L (ref 0–44)
AST: 31 U/L (ref 15–41)
Albumin: 3.2 g/dL — ABNORMAL LOW (ref 3.5–5.0)
Alkaline Phosphatase: 56 U/L (ref 38–126)
Anion gap: 10 (ref 5–15)
BUN: 27 mg/dL — ABNORMAL HIGH (ref 8–23)
CO2: 22 mmol/L (ref 22–32)
Calcium: 8.6 mg/dL — ABNORMAL LOW (ref 8.9–10.3)
Chloride: 103 mmol/L (ref 98–111)
Creatinine, Ser: 0.91 mg/dL (ref 0.61–1.24)
GFR calc Af Amer: >60 mL/min (ref >60)
GFR calc non Af Amer: >60 mL/min (ref >60)
Glucose, Bld: 128 mg/dL — ABNORMAL HIGH (ref 70–99)
Potassium: 4 mmol/L (ref 3.5–5.1)
Sodium: 135 mmol/L (ref 135–145)
Total Bilirubin: 0.9 mg/dL (ref 0.3–1.2)
Total Protein: 6.7 g/dL (ref 6.5–8.1)

## 2019-06-05 LAB — D-DIMER, QUANTITATIVE: D-Dimer, Quant: 0.91 ug/mL-FEU — ABNORMAL HIGH (ref 0.00–0.50)

## 2019-06-05 LAB — CULTURE, BLOOD (ROUTINE X 2)
Culture: NO GROWTH
Culture: NO GROWTH
Special Requests: ADEQUATE
Special Requests: ADEQUATE

## 2019-06-05 LAB — C-REACTIVE PROTEIN: CRP: 1.3 mg/dL — ABNORMAL HIGH (ref <1.0)

## 2019-06-05 LAB — FERRITIN: Ferritin: 430 ng/mL — ABNORMAL HIGH (ref 24–336)

## 2019-06-05 MED ORDER — PANTOPRAZOLE SODIUM 40 MG PO TBEC
40.0000 mg | DELAYED_RELEASE_TABLET | Freq: Two times a day (BID) | ORAL | Status: DC
  Administered 2019-06-05 – 2019-06-06 (×2): 40 mg via ORAL
  Filled 2019-06-05 (×2): qty 1

## 2019-06-05 NOTE — Progress Notes (Signed)
Pt shaved and hair washed per his request. Pt not wearing O2 today, states, "I don't really need it, I'm not short winded, and I think it makes me sick to my stomach." SaO2 remains 96-97% on RA while in bed, 93-94% when OOB to bathroom and no noticeable SOB with ambulation. Offered to let pt take a shower today, he declined stating that he just feels too weak to do anything else today. Pt coughing up small amount thick yellow mucous. IVF continue without issue.

## 2019-06-05 NOTE — Progress Notes (Signed)
PROGRESS NOTE  Brandon [name] [id number] DOB: May 28, 1946 DOA: 06/03/2019 PCP: Sinda Du, MD  Brief History:  74 year old male with a 1 week history of pleuritic type chest pain, shortness of breath, nausea, vomiting, and diarrhea.  The patient was tested positive for COVID-19 on 05/30/2018 when he came to the emergency department on the day.  However, his symptoms continued to progress to the point where he was unable to keep anything down.  As result, he presented again for further evaluation.  He had subjective fevers and chills and a cough with white sputum.  He denied any hematochezia, melena, hematemesis.  His wife has also been hospitalized for COVID-19 pneumonia.  In the emergency department, the patient desaturated into the 80s with ambulation.  He was started on IV steroids and remdesivir.  Assessment/Plan: Acute respiratory failure with hypoxia  -Secondary to COVID-19 pneumonia -Presently stable and not requiring O2 supplementation at rest today; still SOB on exertion. -CRP 2.8>> 2.6>>1.3 -Ferritin 524 -D-dimer 0.92>>> 3.34 -PCT <0.10 -LDH 167 -Fibrinogen 593 -Continue IV steroids and remdesivir -CT angiogram chest neg for PE.  Acute gastroenteritis -Secondary to COVID-19 infection/worsening reflux while using steroids. -Diarrhea seems to be improving/resolved. -Vomiting has improved/resolved -Continues to complain of intermittent abdominal pain -CT abdomen and pelvis neg for acute abnormalities.  -Continue PPI, dose adjusted to BID (see below). -Zofran as needed for nausea/vomiting  Pleuritic chest pain -Troponins unremarkable -Personally reviewed EKG--normal sinus rhythm without any ST-T wave changes -Personally reviewed chest x-ray--bilateral subtle interstitial opacities, compatible with viral infection. -CTA chest as discussed above; neg for PE -will discontinue telemetry -PPI adjusted to BID  GERD -continue PPI -dose adjusted to BID to help  with reflux sx's.  Dehydration -Continue judicious IV fluids -encourage to maintain adequate oral intake.  Essential hypertension -Continue amlodipine  -BP stable.  Hyperlipidemia -Continue statin   Disposition Plan:   Home in 1-2 days; sooner if remains stable and able to secure outpatient remdesivir infusion.   Family Communication:  No Family at bedside  Consultants:  none  Code Status:  FULL   DVT Prophylaxis:  [place] Heparin    Procedures: As Listed in Progress Note Above  Antibiotics: None   Subjective: SOB on exertion and having productive coughing spells. No fever, no CP. Currently off oxygen at rest and expressing feeling on his stomach. No vomiting.   Objective: Vitals:   06/05/19 0545 06/05/19 0800 06/05/19 1111 06/05/19 1358  BP: 124/78  116/78 107/76  Pulse: (!) 52  (!) 52 (!) 57  Resp: 18 16 16 19   Temp: 98.4 F (36.9 C)  98.2 F (36.8 C) 98.4 F (36.9 C)  TempSrc: Oral  Oral   SpO2: 97% 96% 97% 95%  Weight:      Height:        Intake/Output Summary (Last 24 hours) at 06/05/2019 2130 Last data filed at 06/05/2019 1700 Gross per 24 hour  Intake 360 ml  Output --  Net 360 ml   Weight change:   Exam: General exam: Alert, awake, oriented x 3; reports breathing easier and not wearing O2 supplementation at rest. Intermittent thick yellowish productive cough and feeling sick in his stomach. Respiratory system: positive rhonchi bilaterally, no wheezing, no using accessory muscles.  Cardiovascular system:RRR. No murmurs, rubs, gallops. Gastrointestinal system: Abdomen is nondistended, soft and nontender. No organomegaly or masses felt. Normal bowel sounds heard. Central nervous system: Alert and oriented. No focal neurological deficits. Extremities: No  C/C/E, +pedal pulses Skin: No rashes, lesions or ulcers Psychiatry: Judgement and insight appear normal. Mood & affect appropriate.     Data Reviewed: I have personally reviewed following labs and  imaging studies  Basic Metabolic Panel: Recent Labs  Lab 05/31/19 1157 06/03/19 1537 06/04/19 0612 06/05/19 0402  NA 137 134* 136 135  K 3.8 4.2 4.4 4.0  CL 100 99 104 103  CO2 22 20* 22 22  GLUCOSE 113* 139* 123* 128*  BUN 26* 30* 27* 27*  CREATININE 1.04 1.14 0.87 0.91  CALCIUM 9.3 9.2 8.5* 8.6*  MG  --   --  2.1 2.2   Liver Function Tests: Recent Labs  Lab 05/31/19 1157 06/03/19 1537 06/04/19 0612 06/05/19 0402  AST 49* 46* 35 31  ALT 46* 40 32 28  ALKPHOS 69 75 63 56  BILITOT 0.9 1.2 0.8 0.9  PROT 8.0 8.5* 7.2 6.7  ALBUMIN 4.1 4.1 3.3* 3.2*   Recent Labs  Lab 06/03/19 1537  LIPASE 32   No results for input(s): AMMONIA in the last 168 hours. Coagulation Profile: No results for input(s): INR, PROTIME in the last 168 hours. CBC: Recent Labs  Lab 05/31/19 1157 06/03/19 1536 06/04/19 0612 06/05/19 0402  WBC 7.1 6.9 5.1 8.4  NEUTROABS 5.2  --  3.2 5.5  HGB 15.0 15.1 13.5 13.7  HCT 45.6 45.9 41.1 41.5  MCV 92.3 90.7 90.9 90.2  PLT 207 192 184 191   Urine analysis:    Component Value Date/Time   COLORURINE YELLOW 01/05/2018 2033   APPEARANCEUR CLEAR 01/05/2018 2033   LABSPEC >1.046 (H) 01/05/2018 2033   PHURINE 5.0 01/05/2018 2033   GLUCOSEU 50 (A) 01/05/2018 2033   HGBUR NEGATIVE 01/05/2018 2033   BILIRUBINUR NEGATIVE 01/05/2018 2033   KETONESUR NEGATIVE 01/05/2018 2033   PROTEINUR NEGATIVE 01/05/2018 2033   NITRITE POSITIVE (A) 01/05/2018 2033   LEUKOCYTESUR MODERATE (A) 01/05/2018 2033    Recent Results (from the past 240 hour(s))  Blood Culture (routine x 2)     Status: None   Collection Time: 05/31/19 12:27 PM   Specimen: BLOOD  Result Value Ref Range Status   Specimen Description BLOOD LEFT ANTECUBITAL  Final   Special Requests   Final    BOTTLES DRAWN AEROBIC AND ANAEROBIC Blood Culture adequate volume   Culture   Final    NO GROWTH 5 DAYS Performed at [facility], [address] [place], [place] [address]    Report Status  06/05/2019 FINAL  Final  Blood Culture (routine x 2)     Status: None   Collection Time: 05/31/19 12:57 PM   Specimen: BLOOD LEFT HAND  Result Value Ref Range Status   Specimen Description BLOOD LEFT HAND BOTTLES DRAWN AEROBIC ONLY  Final   Special Requests Blood Culture adequate volume  Final   Culture   Final    NO GROWTH 5 DAYS Performed at [facility], [address], [place], [place] [address]    Report Status 06/05/2019 FINAL  Final  Blood Culture (routine x 2)     Status: None (Preliminary result)   Collection Time: 06/03/19  3:26 PM   Specimen: BLOOD LEFT HAND  Result Value Ref Range Status   Specimen Description BLOOD LEFT HAND  Final   Special Requests   Final    BOTTLES DRAWN AEROBIC AND ANAEROBIC Blood Culture adequate volume   Culture   Final    NO GROWTH 2 DAYS Performed at [facility], [address],  [place], [place] [address]    Report Status PENDING  Incomplete  Blood Culture (routine x 2)     Status: None (Preliminary result)   Collection Time: 06/03/19  5:33 PM   Specimen: Right Antecubital; Blood  Result Value Ref Range Status   Specimen Description RIGHT ANTECUBITAL  Final   Special Requests   Final    BOTTLES DRAWN AEROBIC AND ANAEROBIC Blood Culture adequate volume   Culture   Final    NO GROWTH 2 DAYS Performed at [facility], [address] [place], [place] [address]    Report Status PENDING  Incomplete     Scheduled Meds: . amLODipine  10 mg Oral Daily  . atorvastatin  80 mg Oral q1800  . dexamethasone (DECADRON) injection  6 mg Intravenous Q24H  . heparin  5,000 Units Subcutaneous Q8H  . pantoprazole  40 mg Oral BID   Continuous Infusions: . lactated ringers 75 mL/hr at 06/04/19 1600  . remdesivir 100 mg in NS 100 mL 100 mg (06/05/19 1115)    Procedures/Studies: CT ANGIO CHEST PE W OR WO CONTRAST  Result Date: 06/04/2019 CLINICAL DATA:  short of breath. COVID-19 positive. Elevated D-dimer. Rule out pulmonary embolus. Abdominal pain and  diarrhea. Evaluate for suspected abscess. EXAM: CT ANGIOGRAPHY CHEST CT ABDOMEN AND PELVIS WITH CONTRAST TECHNIQUE: Multidetector CT imaging of the chest was performed using the standard protocol during bolus administration of intravenous contrast. Multiplanar CT image reconstructions and MIPs were obtained to evaluate the vascular anatomy. Multidetector CT imaging of the abdomen and pelvis was performed using the standard protocol during bolus administration of intravenous contrast. CONTRAST:  130mL OMNIPAQUE IOHEXOL 350 MG/ML SOLN COMPARISON:  CT chest abdomen and pelvis from 01/05/2018 FINDINGS: CTA CHEST FINDINGS Cardiovascular: The heart size is normal. No pleural or pericardial effusion identified. Mild aortic atherosclerosis. Left main, left circumflex, lad and RCA coronary artery calcifications The main pulmonary artery appears patent. No lobar or segmental pulmonary artery filling defects. Mediastinum/Nodes: Normal appearance of the thyroid gland. The trachea appears patent and is midline. Normal appearance of the esophagus. No mediastinal or hilar adenopathy identified. Lungs/Pleura: Subsegmental atelectasis identified within both lung bases. Patchy areas of ground-glass attenuation are noted within both lungs with an upper lung zone predominance. Musculoskeletal: No chest wall abnormality. No acute or significant osseous findings. Review of the MIP images confirms the above findings. CT ABDOMEN and PELVIS FINDINGS Hepatobiliary: There are several scattered low attenuation foci within the liver. The largest is in segment 3 and is compatible with a simple cyst measuring 1.7 cm. The other foci are too small to characterize but appear unchanged from previous exam favoring benign cysts. Cholecystectomy. No biliary dilatation. Pancreas: Unremarkable. No pancreatic ductal dilatation or surrounding inflammatory changes. Spleen: Normal in size without focal abnormality. Adrenals/Urinary Tract: Normal adrenal  glands. Bilateral kidney cysts. No mass or hydronephrosis identified. Urinary bladder unremarkable. Stomach/Bowel: Small hiatal hernia. The small bowel loops have a normal course and caliber without obstruction. The appendix is visualized and appears normal. Left-sided the colonic diverticulosis without acute diverticulitis. Vascular/Lymphatic: Aortic atherosclerosis. No aneurysm. No abdominopelvic adenopathy identified. Reproductive: Prostate gland appears enlarged measuring 5.8 x 4.8 by 6.1 cm (volume = 89 cm^3). Other: Fat containing left inguinal hernia. Status post right inguinal herniorrhaphy. There is no free fluid or fluid collection identified within the abdomen or pelvis. Musculoskeletal: Mild multilevel degenerative disc disease. Review of the MIP images confirms the above findings. IMPRESSION: 1. No evidence for acute pulmonary embolus. 2. Bilateral lower lobe  atelectasis and multifocal bilateral areas of ground-glass attenuation. Imaging findings compatible with multifocal atypical viral infection 3. No evidence for abscess within the abdomen or pelvis. 4. Aortic atherosclerosis. Left main and 3 vessel coronary artery calcifications noted. 5. Small hiatal hernia. 6. Left-sided colonic diverticulosis without acute diverticulitis. 7. Prostate gland enlargement. Aortic Atherosclerosis (ICD10-I70.0). Electronically Signed   By: Kerby Moors M.D.   On: 06/04/2019 12:07   CT ABDOMEN PELVIS W CONTRAST  Result Date: 06/04/2019 CLINICAL DATA:  short of breath. COVID-19 positive. Elevated D-dimer. Rule out pulmonary embolus. Abdominal pain and diarrhea. Evaluate for suspected abscess. EXAM: CT ANGIOGRAPHY CHEST CT ABDOMEN AND PELVIS WITH CONTRAST TECHNIQUE: Multidetector CT imaging of the chest was performed using the standard protocol during bolus administration of intravenous contrast. Multiplanar CT image reconstructions and MIPs were obtained to evaluate the vascular anatomy. Multidetector CT imaging of  the abdomen and pelvis was performed using the standard protocol during bolus administration of intravenous contrast. CONTRAST:  180mL OMNIPAQUE IOHEXOL 350 MG/ML SOLN COMPARISON:  CT chest abdomen and pelvis from 01/05/2018 FINDINGS: CTA CHEST FINDINGS Cardiovascular: The heart size is normal. No pleural or pericardial effusion identified. Mild aortic atherosclerosis. Left main, left circumflex, lad and RCA coronary artery calcifications The main pulmonary artery appears patent. No lobar or segmental pulmonary artery filling defects. Mediastinum/Nodes: Normal appearance of the thyroid gland. The trachea appears patent and is midline. Normal appearance of the esophagus. No mediastinal or hilar adenopathy identified. Lungs/Pleura: Subsegmental atelectasis identified within both lung bases. Patchy areas of ground-glass attenuation are noted within both lungs with an upper lung zone predominance. Musculoskeletal: No chest wall abnormality. No acute or significant osseous findings. Review of the MIP images confirms the above findings. CT ABDOMEN and PELVIS FINDINGS Hepatobiliary: There are several scattered low attenuation foci within the liver. The largest is in segment 3 and is compatible with a simple cyst measuring 1.7 cm. The other foci are too small to characterize but appear unchanged from previous exam favoring benign cysts. Cholecystectomy. No biliary dilatation. Pancreas: Unremarkable. No pancreatic ductal dilatation or surrounding inflammatory changes. Spleen: Normal in size without focal abnormality. Adrenals/Urinary Tract: Normal adrenal glands. Bilateral kidney cysts. No mass or hydronephrosis identified. Urinary bladder unremarkable. Stomach/Bowel: Small hiatal hernia. The small bowel loops have a normal course and caliber without obstruction. The appendix is visualized and appears normal. Left-sided the colonic diverticulosis without acute diverticulitis. Vascular/Lymphatic: Aortic atherosclerosis. No  aneurysm. No abdominopelvic adenopathy identified. Reproductive: Prostate gland appears enlarged measuring 5.8 x 4.8 by 6.1 cm (volume = 89 cm^3). Other: Fat containing left inguinal hernia. Status post right inguinal herniorrhaphy. There is no free fluid or fluid collection identified within the abdomen or pelvis. Musculoskeletal: Mild multilevel degenerative disc disease. Review of the MIP images confirms the above findings. IMPRESSION: 1. No evidence for acute pulmonary embolus. 2. Bilateral lower lobe atelectasis and multifocal bilateral areas of ground-glass attenuation. Imaging findings compatible with multifocal atypical viral infection 3. No evidence for abscess within the abdomen or pelvis. 4. Aortic atherosclerosis. Left main and 3 vessel coronary artery calcifications noted. 5. Small hiatal hernia. 6. Left-sided colonic diverticulosis without acute diverticulitis. 7. Prostate gland enlargement. Aortic Atherosclerosis (ICD10-I70.0). Electronically Signed   By: Kerby Moors M.D.   On: 06/04/2019 12:07   DG Chest Port 1 View  Result Date: 06/03/2019 CLINICAL DATA:  74 year old male with COVID-19. Shortness of breath, chest and back pain. EXAM: PORTABLE CHEST 1 VIEW COMPARISON:  05/31/2019 portable chest and earlier. FINDINGS: Portable AP upright  view at 1532 hours. Subtle asymmetric interstitial and hazy opacity in the right mid lung and at the left lung base but otherwise both lungs appear clear when allowing for portable technique. Mediastinal contours remain normal. Visualized tracheal air column is within normal limits. No pneumothorax or pleural effusion. Negative visible bowel gas pattern. No acute osseous abnormality identified. IMPRESSION: Possible mild bilateral COVID-19 pneumonia but otherwise no acute cardiopulmonary abnormality. Electronically Signed   By: Genevie Ann M.D.   On: 06/03/2019 15:50   DG Chest Port 1 View  Result Date: 05/31/2019 CLINICAL DATA:  Vomiting and diarrhea. Shortness  of breath and chest pain EXAM: PORTABLE CHEST 1 VIEW COMPARISON:  01/09/2018. FINDINGS: The heart size appears within normal limits. No pleural or pericardial effusion identified. No airspace opacities identified. Visualized osseous structures are unremarkable IMPRESSION: No acute cardiopulmonary abnormalities Electronically Signed   By: Kerby Moors M.D.   On: 05/31/2019 12:24    Barton Dubois, MD  [facility] Pager [phone]  06/05/2019, 9:30 PM   LOS: 2 days

## 2019-06-06 DIAGNOSIS — I10 Essential (primary) hypertension: Secondary | ICD-10-CM

## 2019-06-06 DIAGNOSIS — R0602 Shortness of breath: Secondary | ICD-10-CM

## 2019-06-06 LAB — CBC WITH DIFFERENTIAL/PLATELET
Abs Immature Granulocytes: 0.03 10*3/uL (ref 0.00–0.07)
Basophils Absolute: 0 10*3/uL (ref 0.0–0.1)
Basophils Relative: 0 %
Eosinophils Absolute: 0 10*3/uL (ref 0.0–0.5)
Eosinophils Relative: 0 %
HCT: 41.2 % (ref 39.0–52.0)
Hemoglobin: 13.6 g/dL (ref 13.0–17.0)
Immature Granulocytes: 0 %
Lymphocytes Relative: 30 %
Lymphs Abs: 3.1 10*3/uL (ref 0.7–4.0)
MCH: 29.9 pg (ref 26.0–34.0)
MCHC: 33 g/dL (ref 30.0–36.0)
MCV: 90.5 fL (ref 80.0–100.0)
Monocytes Absolute: 0.8 10*3/uL (ref 0.1–1.0)
Monocytes Relative: 8 %
Neutro Abs: 6.2 10*3/uL (ref 1.7–7.7)
Neutrophils Relative %: 62 %
Platelets: 232 10*3/uL (ref 150–400)
RBC: 4.55 MIL/uL (ref 4.22–5.81)
RDW: 11.8 % (ref 11.5–15.5)
WBC: 10.1 10*3/uL (ref 4.0–10.5)
nRBC: 0 % (ref 0.0–0.2)

## 2019-06-06 LAB — COMPREHENSIVE METABOLIC PANEL
ALT: 28 U/L (ref 0–44)
AST: 31 U/L (ref 15–41)
Albumin: 3.2 g/dL — ABNORMAL LOW (ref 3.5–5.0)
Alkaline Phosphatase: 60 U/L (ref 38–126)
Anion gap: 8 (ref 5–15)
BUN: 25 mg/dL — ABNORMAL HIGH (ref 8–23)
CO2: 23 mmol/L (ref 22–32)
Calcium: 8.5 mg/dL — ABNORMAL LOW (ref 8.9–10.3)
Chloride: 105 mmol/L (ref 98–111)
Creatinine, Ser: 0.91 mg/dL (ref 0.61–1.24)
GFR calc Af Amer: >60 mL/min (ref >60)
GFR calc non Af Amer: >60 mL/min (ref >60)
Glucose, Bld: 109 mg/dL — ABNORMAL HIGH (ref 70–99)
Potassium: 4.2 mmol/L (ref 3.5–5.1)
Sodium: 136 mmol/L (ref 135–145)
Total Bilirubin: 1 mg/dL (ref 0.3–1.2)
Total Protein: 6.7 g/dL (ref 6.5–8.1)

## 2019-06-06 LAB — D-DIMER, QUANTITATIVE: D-Dimer, Quant: 0.58 ug/mL-FEU — ABNORMAL HIGH (ref 0.00–0.50)

## 2019-06-06 LAB — FERRITIN: Ferritin: 411 ng/mL — ABNORMAL HIGH (ref 24–336)

## 2019-06-06 LAB — C-REACTIVE PROTEIN: CRP: 0.8 mg/dL (ref <1.0)

## 2019-06-06 LAB — MAGNESIUM: Magnesium: 2.3 mg/dL (ref 1.7–2.4)

## 2019-06-06 MED ORDER — ALBUTEROL SULFATE HFA 108 (90 BASE) MCG/ACT IN AERS: 2.0000 | INHALATION_SPRAY | Freq: Four times a day (QID) | RESPIRATORY_TRACT | 0 refills | Status: DC | PRN

## 2019-06-06 MED ORDER — ESOMEPRAZOLE MAGNESIUM 20 MG PO CPDR: 20.0000 mg | DELAYED_RELEASE_CAPSULE | Freq: Two times a day (BID) | ORAL | 1 refills | Status: DC

## 2019-06-06 MED ORDER — DEXAMETHASONE 6 MG PO TABS: 6.0000 mg | ORAL_TABLET | Freq: Every day | ORAL | 0 refills | Status: AC

## 2019-06-06 MED ORDER — ESOMEPRAZOLE MAGNESIUM 20 MG PO CPDR: 20.0000 mg | DELAYED_RELEASE_CAPSULE | Freq: Every day | ORAL | 1 refills | Status: DC

## 2019-06-06 NOTE — Progress Notes (Signed)
Pt discharged via wheelchair to POV. Pt alert, oriented, denies any SOB, VSS. All belongings verified with pt prior to discharge. Pt aware of appt at [facility] tomorrow at 1pm for Remdesivir infusion, states will have his daughter ride with him to appt. Reviewed symptoms to report to MD and reasons for return to ED (increased SOB not relieved with inhaler, fever not relieved with OTC fever reducer, nausea/vomiting not relieved with antiemetics, confusion, etc.) Pt able to give satisfactory verbal return.

## 2019-06-06 NOTE — Plan of Care (Signed)

## 2019-06-06 NOTE — Discharge Instructions (Signed)
You are scheduled for an outpatient infusion of Remdesivir at 1PM on Friday 1/8.  Please report to [facility] at [address].  Drive to the security guard and tell them you are here for an infusion. They will direct you to the front entrance where we will come and get you.  For questions call [phone].  Thanks

## 2019-06-06 NOTE — Discharge Summary (Signed)
Physician Discharge Summary  Brandon [name] [id number] DOB: 1945/08/13 DOA: 06/03/2019  PCP: Sinda Du, MD  Admit date: 06/03/2019 Discharge date: 06/06/2019  Time spent: 35 minutes  Recommendations for Outpatient Follow-up:  1. Repeat CBC to follow WBCs and hemoglobin 2. Repeat complete basic metabolic panel to follow LFTs, electrolytes and renal function. 3. Repeat chest x-ray in 8 weeks to assure complete resolution of infiltrates.   Discharge Diagnoses:  Active Problems:   SOB (shortness of breath)   COVID-19   Gastroenteritis due to COVID-19 virus   Pneumonia due to COVID-19 virus   Acute respiratory failure with hypoxia (HCC)   Nausea vomiting and diarrhea   Discharge Condition: Stable and improved.  Patient discharged home with instruction to follow-up with PCP in 10 days.  Outpatient remdesivir infusion arrange for 06/07/2019 at 1 PM.  Diet recommendation: Heart healthy diet.  Filed Weights   06/03/19 1147 06/04/19 0010  Weight: 77.1 kg 66.2 kg    History of present illness:  As per H&P written by Dr. Clearence Ped on 06/03/19 74 year old male with a 1 week history of pleuritic type chest pain, shortness of breath, nausea, vomiting, and diarrhea.  The patient was tested positive for COVID-19 on 05/30/2018 when he came to the emergency department on the day.  However, his symptoms continued to progress to the point where he was unable to keep anything down.  As result, he presented again for further evaluation.  He had subjective fevers and chills and a cough with white sputum.  He denied any hematochezia, melena, hematemesis.  His wife has also been hospitalized for COVID-19 pneumonia.  In the emergency department, the patient desaturated into the 80s with ambulation.  He was started on IV steroids and remdesivir.  Hospital Course:  Acute respiratory failure with hypoxia  -Secondary to COVID-19 pneumonia -Presently stable and not requiring O2 supplementation at rest  today; still SOB on exertion. -CRP 2.8>> 2.6>>1.3 -Ferritin 524 -D-dimer 0.92>>> 3.34 -PCT <0.10 -LDH 167 -Fibrinogen 593 -Patient was effectively and successfully treated with IV steroids and remdesivir; will complete remdesivir infusion with extra dose on 06/07/2019 as an outpatient. -Oral steroids has been initiated and he will complete treatment on 06/10/19 -CT angiogram chest neg for PE. -Recommending repeat chest x-ray in 8 weeks to assure complete resolution of infiltrates.  Acute gastroenteritis -Secondary to COVID-19 infection/worsening reflux while using steroids. -Diarrhea seems to be improved/resolved. -Vomiting has improved/resolved -No abdominal pain at discharge. -CT abdomen and pelvis neg for acute abnormalities.  -Continue PPI, dose adjusted to BID (see below). -Continue Zofran as needed for nausea/vomiting  Pleuritic chest pain -Troponin unremarkable -Personally reviewed chest x-ray--bilateral subtle interstitial opacities, compatible with viral infection. -CTA chest as discussed above; neg for PE -No telemetry or EKG abnormalities. -PPI adjusted to BID  GERD -continue PPI -dose adjusted to BID to help with reflux sx's.  Dehydration -IV fluid resuscitation provided -Patient eating and drinking without problems at time of discharge. -encouraged to maintain adequate oral intake.  Essential hypertension -Continue amlodipine  -BP stable. -Advised to follow heart healthy diet.  Hyperlipidemia -Continue statin  Procedures:  See below for x-ray reports  Consultations:  None  Discharge Exam: Vitals:   06/06/19 0933 06/06/19 1500  BP: 100/70 105/77  Pulse: (!) 50 (!) 59  Resp: 18 (!) 21  Temp:  98.2 F (36.8 C)  SpO2: 96% 94%    General: Afebrile, no chest pain, no nausea, no vomiting.  Patient feeling much better overall and ready to go  home.  At discharge no requiring oxygen supplementation. Respiratory system: positive rhonchi  bilaterally, no wheezing, no using accessory muscles.  Cardiovascular system:RRR. No murmurs, rubs, gallops.  No JVD. Gastrointestinal system: Abdomen is nondistended, soft and nontender. No organomegaly or masses felt. Normal bowel sounds heard.  Tolerating diet without problems. Central nervous system: Alert and oriented. No focal neurological deficits. Extremities: No C/C/E, +pedal pulses Skin: No rashes, lesions or ulcers  Discharge Instructions   Discharge Instructions    Discharge instructions   Complete by: As directed    Take medications as prescribed Maintain adequate hydration Complete outpatient remdesivir infusion as instructed/scheduled. Outpatient follow-up with PCP in 10 days. Follow heart healthy diet.   Increase activity slowly   Complete by: As directed    MyChart COVID-19 home monitoring program   Complete by: Jun 06, 2019    Is the patient willing to use the [facility] for home monitoring?: Yes   Temperature monitoring   Complete by: Jun 06, 2019    After how many days would you like to receive a notification of this patient's flowsheet entries?: 1     Allergies as of 06/06/2019      Reactions   Ciprofloxacin Nausea And Vomiting      Medication List    TAKE these medications   albuterol 108 (90 Base) MCG/ACT inhaler Commonly known as: VENTOLIN HFA Inhale 2 puffs into the lungs every 6 (six) hours as needed for wheezing or shortness of breath.   amLODipine 10 MG tablet Commonly known as: NORVASC Take 1 tablet (10 mg total) by mouth daily.   atorvastatin 80 MG tablet Commonly known as: LIPITOR TAKE ONE TABLET BY MOUTH DAILY AT 6 PM. What changed: See the new instructions.   butalbital-acetaminophen-caffeine 50-325-40 MG tablet Commonly known as: FIORICET Take 1 tablet by mouth every 4 (four) hours as needed for headache.   dexamethasone 6 MG tablet Commonly known as: DECADRON Take 1 tablet (6 mg total) by mouth daily for 4 days.    esomeprazole 20 MG capsule Commonly known as: NexIUM Take 1 capsule (20 mg total) by mouth daily at 12 noon. Take 1 caplsule by mouth before breakfast What changed:   how much to take  how to take this  when to take this   Nitrostat 0.4 MG SL tablet Generic drug: nitroGLYCERIN PLACE 1 TABLET UNDER TONGUE FOR CHEST PAIN. MAY REPEAT EVERY 5 MIN UPTO 3 DOSES-NO RELIEF,CALL 911. What changed: See the new instructions.   ondansetron 4 MG disintegrating tablet Commonly known as: Zofran ODT Take 1 tablet (4 mg total) by mouth every 8 (eight) hours as needed for nausea.   promethazine 25 MG tablet Commonly known as: PHENERGAN Take 1 tablet (25 mg total) by mouth every 6 (six) hours as needed for nausea or vomiting.      Allergies  Allergen Reactions  . Ciprofloxacin Nausea And Vomiting   Follow-up Information    Sinda Du, MD. Schedule an appointment as soon as possible for a visit in 10 day(s).   Specialty: Pulmonary Disease Contact information: [address] [place] [address] [phone]        Josue Hector, MD .   Specialty: Cardiology Contact information: [id number] [name] [address] [phone]           The results of significant diagnostics from this hospitalization (including imaging, microbiology, ancillary and laboratory) are listed below for reference.    Significant Diagnostic Studies: CT ANGIO  CHEST PE W OR WO CONTRAST  Result Date: 06/04/2019 CLINICAL DATA:  short of breath. COVID-19 positive. Elevated D-dimer. Rule out pulmonary embolus. Abdominal pain and diarrhea. Evaluate for suspected abscess. EXAM: CT ANGIOGRAPHY CHEST CT ABDOMEN AND PELVIS WITH CONTRAST TECHNIQUE: Multidetector CT imaging of the chest was performed using the standard protocol during bolus administration of intravenous contrast. Multiplanar CT image reconstructions and MIPs were obtained to evaluate the vascular anatomy. Multidetector  CT imaging of the abdomen and pelvis was performed using the standard protocol during bolus administration of intravenous contrast. CONTRAST:  184mL OMNIPAQUE IOHEXOL 350 MG/ML SOLN COMPARISON:  CT chest abdomen and pelvis from 01/05/2018 FINDINGS: CTA CHEST FINDINGS Cardiovascular: The heart size is normal. No pleural or pericardial effusion identified. Mild aortic atherosclerosis. Left main, left circumflex, lad and RCA coronary artery calcifications The main pulmonary artery appears patent. No lobar or segmental pulmonary artery filling defects. Mediastinum/Nodes: Normal appearance of the thyroid gland. The trachea appears patent and is midline. Normal appearance of the esophagus. No mediastinal or hilar adenopathy identified. Lungs/Pleura: Subsegmental atelectasis identified within both lung bases. Patchy areas of ground-glass attenuation are noted within both lungs with an upper lung zone predominance. Musculoskeletal: No chest wall abnormality. No acute or significant osseous findings. Review of the MIP images confirms the above findings. CT ABDOMEN and PELVIS FINDINGS Hepatobiliary: There are several scattered low attenuation foci within the liver. The largest is in segment 3 and is compatible with a simple cyst measuring 1.7 cm. The other foci are too small to characterize but appear unchanged from previous exam favoring benign cysts. Cholecystectomy. No biliary dilatation. Pancreas: Unremarkable. No pancreatic ductal dilatation or surrounding inflammatory changes. Spleen: Normal in size without focal abnormality. Adrenals/Urinary Tract: Normal adrenal glands. Bilateral kidney cysts. No mass or hydronephrosis identified. Urinary bladder unremarkable. Stomach/Bowel: Small hiatal hernia. The small bowel loops have a normal course and caliber without obstruction. The appendix is visualized and appears normal. Left-sided the colonic diverticulosis without acute diverticulitis. Vascular/Lymphatic: Aortic  atherosclerosis. No aneurysm. No abdominopelvic adenopathy identified. Reproductive: Prostate gland appears enlarged measuring 5.8 x 4.8 by 6.1 cm (volume = 89 cm^3). Other: Fat containing left inguinal hernia. Status post right inguinal herniorrhaphy. There is no free fluid or fluid collection identified within the abdomen or pelvis. Musculoskeletal: Mild multilevel degenerative disc disease. Review of the MIP images confirms the above findings. IMPRESSION: 1. No evidence for acute pulmonary embolus. 2. Bilateral lower lobe atelectasis and multifocal bilateral areas of ground-glass attenuation. Imaging findings compatible with multifocal atypical viral infection 3. No evidence for abscess within the abdomen or pelvis. 4. Aortic atherosclerosis. Left main and 3 vessel coronary artery calcifications noted. 5. Small hiatal hernia. 6. Left-sided colonic diverticulosis without acute diverticulitis. 7. Prostate gland enlargement. Aortic Atherosclerosis (ICD10-I70.0). Electronically Signed   By: Kerby Moors M.D.   On: 06/04/2019 12:07   CT ABDOMEN PELVIS W CONTRAST  Result Date: 06/04/2019 CLINICAL DATA:  short of breath. COVID-19 positive. Elevated D-dimer. Rule out pulmonary embolus. Abdominal pain and diarrhea. Evaluate for suspected abscess. EXAM: CT ANGIOGRAPHY CHEST CT ABDOMEN AND PELVIS WITH CONTRAST TECHNIQUE: Multidetector CT imaging of the chest was performed using the standard protocol during bolus administration of intravenous contrast. Multiplanar CT image reconstructions and MIPs were obtained to evaluate the vascular anatomy. Multidetector CT imaging of the abdomen and pelvis was performed using the standard protocol during bolus administration of intravenous contrast. CONTRAST:  150mL OMNIPAQUE IOHEXOL 350 MG/ML SOLN COMPARISON:  CT chest abdomen and pelvis from 01/05/2018  FINDINGS: CTA CHEST FINDINGS Cardiovascular: The heart size is normal. No pleural or pericardial effusion identified. Mild aortic  atherosclerosis. Left main, left circumflex, lad and RCA coronary artery calcifications The main pulmonary artery appears patent. No lobar or segmental pulmonary artery filling defects. Mediastinum/Nodes: Normal appearance of the thyroid gland. The trachea appears patent and is midline. Normal appearance of the esophagus. No mediastinal or hilar adenopathy identified. Lungs/Pleura: Subsegmental atelectasis identified within both lung bases. Patchy areas of ground-glass attenuation are noted within both lungs with an upper lung zone predominance. Musculoskeletal: No chest wall abnormality. No acute or significant osseous findings. Review of the MIP images confirms the above findings. CT ABDOMEN and PELVIS FINDINGS Hepatobiliary: There are several scattered low attenuation foci within the liver. The largest is in segment 3 and is compatible with a simple cyst measuring 1.7 cm. The other foci are too small to characterize but appear unchanged from previous exam favoring benign cysts. Cholecystectomy. No biliary dilatation. Pancreas: Unremarkable. No pancreatic ductal dilatation or surrounding inflammatory changes. Spleen: Normal in size without focal abnormality. Adrenals/Urinary Tract: Normal adrenal glands. Bilateral kidney cysts. No mass or hydronephrosis identified. Urinary bladder unremarkable. Stomach/Bowel: Small hiatal hernia. The small bowel loops have a normal course and caliber without obstruction. The appendix is visualized and appears normal. Left-sided the colonic diverticulosis without acute diverticulitis. Vascular/Lymphatic: Aortic atherosclerosis. No aneurysm. No abdominopelvic adenopathy identified. Reproductive: Prostate gland appears enlarged measuring 5.8 x 4.8 by 6.1 cm (volume = 89 cm^3). Other: Fat containing left inguinal hernia. Status post right inguinal herniorrhaphy. There is no free fluid or fluid collection identified within the abdomen or pelvis. Musculoskeletal: Mild multilevel  degenerative disc disease. Review of the MIP images confirms the above findings. IMPRESSION: 1. No evidence for acute pulmonary embolus. 2. Bilateral lower lobe atelectasis and multifocal bilateral areas of ground-glass attenuation. Imaging findings compatible with multifocal atypical viral infection 3. No evidence for abscess within the abdomen or pelvis. 4. Aortic atherosclerosis. Left main and 3 vessel coronary artery calcifications noted. 5. Small hiatal hernia. 6. Left-sided colonic diverticulosis without acute diverticulitis. 7. Prostate gland enlargement. Aortic Atherosclerosis (ICD10-I70.0). Electronically Signed   By: Kerby Moors M.D.   On: 06/04/2019 12:07   DG Chest Port 1 View  Result Date: 06/03/2019 CLINICAL DATA:  74 year old male with COVID-19. Shortness of breath, chest and back pain. EXAM: PORTABLE CHEST 1 VIEW COMPARISON:  05/31/2019 portable chest and earlier. FINDINGS: Portable AP upright view at 1532 hours. Subtle asymmetric interstitial and hazy opacity in the right mid lung and at the left lung base but otherwise both lungs appear clear when allowing for portable technique. Mediastinal contours remain normal. Visualized tracheal air column is within normal limits. No pneumothorax or pleural effusion. Negative visible bowel gas pattern. No acute osseous abnormality identified. IMPRESSION: Possible mild bilateral COVID-19 pneumonia but otherwise no acute cardiopulmonary abnormality. Electronically Signed   By: Genevie Ann M.D.   On: 06/03/2019 15:50   DG Chest Port 1 View  Result Date: 05/31/2019 CLINICAL DATA:  Vomiting and diarrhea. Shortness of breath and chest pain EXAM: PORTABLE CHEST 1 VIEW COMPARISON:  01/09/2018. FINDINGS: The heart size appears within normal limits. No pleural or pericardial effusion identified. No airspace opacities identified. Visualized osseous structures are unremarkable IMPRESSION: No acute cardiopulmonary abnormalities Electronically Signed   By: Kerby Moors M.D.   On: 05/31/2019 12:24   Microbiology: Recent Results (from the past 240 hour(s))  Blood Culture (routine x 2)     Status: None  Collection Time: 05/31/19 12:27 PM   Specimen: BLOOD  Result Value Ref Range Status   Specimen Description BLOOD LEFT ANTECUBITAL  Final   Special Requests   Final    BOTTLES DRAWN AEROBIC AND ANAEROBIC Blood Culture adequate volume   Culture   Final    NO GROWTH 5 DAYS Performed at [facility], [address], [place], [place] [address]    Report Status 06/05/2019 FINAL  Final  Blood Culture (routine x 2)     Status: None   Collection Time: 05/31/19 12:57 PM   Specimen: BLOOD LEFT HAND  Result Value Ref Range Status   Specimen Description BLOOD LEFT HAND BOTTLES DRAWN AEROBIC ONLY  Final   Special Requests Blood Culture adequate volume  Final   Culture   Final    NO GROWTH 5 DAYS Performed at [facility], [address] [place], [place] [address]    Report Status 06/05/2019 FINAL  Final  Blood Culture (routine x 2)     Status: None (Preliminary result)   Collection Time: 06/03/19  3:26 PM   Specimen: BLOOD LEFT HAND  Result Value Ref Range Status   Specimen Description BLOOD LEFT HAND  Final   Special Requests   Final    BOTTLES DRAWN AEROBIC AND ANAEROBIC Blood Culture adequate volume   Culture   Final    NO GROWTH 3 DAYS Performed at [facility], [address], [place], [place] [address]    Report Status PENDING  Incomplete  Blood Culture (routine x 2)     Status: None (Preliminary result)   Collection Time: 06/03/19  5:33 PM   Specimen: Right Antecubital; Blood  Result Value Ref Range Status   Specimen Description RIGHT ANTECUBITAL  Final   Special Requests   Final    BOTTLES DRAWN AEROBIC AND ANAEROBIC Blood Culture adequate volume   Culture   Final    NO GROWTH 3 DAYS Performed at [facility], [address] [place],  [address]    Report Status PENDING  Incomplete     Labs: Basic Metabolic Panel: Recent  Labs  Lab 05/31/19 1157 06/03/19 1537 06/04/19 0612 06/05/19 0402 06/06/19 0748  NA 137 134* 136 135 136  K 3.8 4.2 4.4 4.0 4.2  CL 100 99 104 103 105  CO2 22 20* 22 22 23   GLUCOSE 113* 139* 123* 128* 109*  BUN 26* 30* 27* 27* 25*  CREATININE 1.04 1.14 0.87 0.91 0.91  CALCIUM 9.3 9.2 8.5* 8.6* 8.5*  MG  --   --  2.1 2.2 2.3   Liver Function Tests: Recent Labs  Lab 05/31/19 1157 06/03/19 1537 06/04/19 0612 06/05/19 0402 06/06/19 0748  AST 49* 46* 35 31 31  ALT 46* 40 32 28 28  ALKPHOS 69 75 63 56 60  BILITOT 0.9 1.2 0.8 0.9 1.0  PROT 8.0 8.5* 7.2 6.7 6.7  ALBUMIN 4.1 4.1 3.3* 3.2* 3.2*   Recent Labs  Lab 06/03/19 1537  LIPASE 32   CBC: Recent Labs  Lab 05/31/19 1157 06/03/19 1536 06/04/19 0612 06/05/19 0402 06/06/19 0748  WBC 7.1 6.9 5.1 8.4 10.1  NEUTROABS 5.2  --  3.2 5.5 6.2  HGB 15.0 15.1 13.5 13.7 13.6  HCT 45.6 45.9 41.1 41.5 41.2  MCV 92.3 90.7 90.9 90.2 90.5  PLT 207 192 184 191 232     Signed:  Barton Dubois MD.  [facility] 06/06/2019, 3:27 PM

## 2019-06-06 NOTE — Progress Notes (Addendum)
Patient scheduled for outpatient Remdesivir infusion at 1PM on Friday  Please advise them to report to [facility] at [address].  Drive to the security guard and tell them you are here for an infusion. They will direct you to the front entrance where we will come and get you.  For questions call [phone].  Thanks

## 2019-06-07 ENCOUNTER — Ambulatory Visit (HOSPITAL_COMMUNITY)
Admission: RE | Admit: 2019-06-07 | Discharge: 2019-06-07 | Disposition: A | Discharge status: Home / Self Care | Payer: Medicare Other | Source: Ambulatory Visit | Attending: Pulmonary Disease | Admitting: Pulmonary Disease

## 2019-06-07 DIAGNOSIS — J1289 Other viral pneumonia: Secondary | ICD-10-CM | POA: Insufficient documentation

## 2019-06-07 DIAGNOSIS — U071 COVID-19: Secondary | ICD-10-CM | POA: Diagnosis not present

## 2019-06-07 MED ORDER — SODIUM CHLORIDE 0.9 % IV SOLN: INTRAVENOUS | Status: DC | PRN

## 2019-06-07 MED ORDER — ALBUTEROL SULFATE HFA 108 (90 BASE) MCG/ACT IN AERS: 2.0000 | INHALATION_SPRAY | Freq: Once | RESPIRATORY_TRACT | Status: DC | PRN

## 2019-06-07 MED ORDER — SODIUM CHLORIDE 0.9 % IV SOLN
100.0000 mg | Freq: Once | INTRAVENOUS | Status: AC
  Administered 2019-06-07: 100 mg via INTRAVENOUS

## 2019-06-07 MED ORDER — FAMOTIDINE IN NACL 20-0.9 MG/50ML-% IV SOLN: 20.0000 mg | Freq: Once | INTRAVENOUS | Status: DC | PRN

## 2019-06-07 MED ORDER — SODIUM CHLORIDE 0.9 % IV SOLN
INTRAVENOUS | Status: AC
  Filled 2019-06-07: qty 20

## 2019-06-07 MED ORDER — DIPHENHYDRAMINE HCL 50 MG/ML IJ SOLN: 50.0000 mg | Freq: Once | INTRAMUSCULAR | Status: DC | PRN

## 2019-06-07 MED ORDER — EPINEPHRINE 0.3 MG/0.3ML IJ SOAJ: 0.3000 mg | Freq: Once | INTRAMUSCULAR | Status: DC | PRN

## 2019-06-07 MED ORDER — METHYLPREDNISOLONE SODIUM SUCC 125 MG IJ SOLR: 125.0000 mg | Freq: Once | INTRAMUSCULAR | Status: DC | PRN

## 2019-06-07 NOTE — Progress Notes (Signed)
  Diagnosis: COVID-19  Physician:  Procedure: [facility] Infusion [facility] Med: remdesivir infusion.  Complications: No immediate complications noted.  Discharge: Discharged home   Tia Masker 06/07/2019

## 2019-06-08 ENCOUNTER — Ambulatory Visit (HOSPITAL_COMMUNITY): Payer: Medicare Other

## 2019-06-08 LAB — CULTURE, BLOOD (ROUTINE X 2)
Culture: NO GROWTH
Culture: NO GROWTH
Special Requests: ADEQUATE
Special Requests: ADEQUATE

## 2019-07-24 ENCOUNTER — Ambulatory Visit: Payer: Medicare Other | Admitting: Family Medicine

## 2019-08-08 ENCOUNTER — Ambulatory Visit (INDEPENDENT_AMBULATORY_CARE_PROVIDER_SITE_OTHER): Payer: Medicare Other | Admitting: Family Medicine

## 2019-08-08 ENCOUNTER — Other Ambulatory Visit: Payer: Self-pay

## 2019-08-08 ENCOUNTER — Encounter: Payer: Self-pay | Admitting: Family Medicine

## 2019-08-08 VITALS — BP 112/74 | Temp 98.2°F | Ht 68.0 in | Wt 170.0 lb

## 2019-08-08 DIAGNOSIS — E785 Hyperlipidemia, unspecified: Secondary | ICD-10-CM | POA: Diagnosis not present

## 2019-08-08 DIAGNOSIS — R351 Nocturia: Secondary | ICD-10-CM

## 2019-08-08 DIAGNOSIS — I2510 Atherosclerotic heart disease of native coronary artery without angina pectoris: Secondary | ICD-10-CM | POA: Diagnosis not present

## 2019-08-08 DIAGNOSIS — I10 Essential (primary) hypertension: Secondary | ICD-10-CM | POA: Diagnosis not present

## 2019-08-08 DIAGNOSIS — H9193 Unspecified hearing loss, bilateral: Secondary | ICD-10-CM | POA: Diagnosis not present

## 2019-08-08 DIAGNOSIS — Z9861 Coronary angioplasty status: Secondary | ICD-10-CM | POA: Diagnosis not present

## 2019-08-08 NOTE — Progress Notes (Signed)
Subjective:  Patient ID: Brandon [name], male    DOB: 03/24/1946  Age: 74 y.o. MRN: [id number]  CC:  Chief Complaint  Patient presents with  . Establish Care    sometimes if he misses taking his bp pill he feels like he gets dizzy or off balance. He got back on his schedule with his pills and it went away   . Ear Pain    right ear hurts him sometimes especially if he lays on it, Has to wear a hearing aid too     HPI  HPI  Brandon [name] is a 74 year old male patient who presents today to establish care.  Was previous patient of Dr. Luan Pulling. He has a significant history of hypertension, CAD with stent placement, post Covid pneumonia, bilateral hearing loss, fatigue, hyperlipidemia among others.  Concerns today include ear pain and some dizziness/blood pressure medication issues.   He reports that he has right ear pain especially when he lays on it.  He has a hearing aid in it but he reports he used to have a hearing aid for last 1 before he broke or lost it and he did not have any discomfort with it.  Has not been seen by the audiologist for this discomfort.  Denies having any other signs of symptoms of having a ostia media or ear infection.  He is worn hearing aids for 6 to 7 years.  It does not hurt to have the hearing aid in.  Predominantly hurts at night and sometimes during the day.  He reports that 2 weeks ago he missed taking his blood pressure medication and he thought that it made him feel dizzy.  For 2 to 3 days he felt like this.  1 January him and his wife had developed Covid.  He reports that ever since then he was just feeling kind of bad.  That is why he missed the couple days of his blood pressure medicine.  He reports he still has a cough from this but he reports that it is improving.  He reports that he has some arthritis in his right hand to get stiff, both knees bother him throughout the winter and shoulders.  He was a walker and so he uses joint quite frequently when  he was younger and now he has discomfort with them.  Reports using Tylenol as needed otherwise does not take anything.    Cataracts removed-Dr Sharpio-better now. Eye dr yearly  Upper plate and bottom -only goes to dentist when teeth hurt.   Overall he reports he is up-to-date on all of his vaccines and screenings.  He has no other concerns outside of the ones listed above.  Today patient denies signs and symptoms of COVID 19 infection including fever, chills, cough, shortness of breath, and headache. Past Medical, Surgical, Social History, Allergies, and Medications have been Reviewed.   Past Medical History:  Diagnosis Date  . Arthritis   . Blood transfusion    no trouble-51 years ago  . CAD S/P percutaneous coronary angioplasty: DES PCI to mLAD & OM1 04/03/2014   a. Lesion #1: (OM 1 90%) Promus Premier DES 2.25 x 12 mm (2.5 mm)  Lesion #2: (mid LAD 70% ) Promus Premier DES 2.5 x 16 mm  (2.75 mm)   . Cancer (HCC)    Skin  . Chest pain 06/21/2012  . Chronic headaches   . Chronic prostatitis   . COVID-19 06/03/2019  . Diverticulitis   . Fatigue 06/21/2012  .  GERD (gastroesophageal reflux disease)   . Hematuria   . Hernia of other specified sites of abdominal cavity without mention of obstruction or gangrene   . Hypertension   . Nausea vomiting and diarrhea   . Other specified forms of hearing loss   . Pneumonia due to COVID-19 virus 06/04/2019  . PONV (postoperative nausea and vomiting)   . Shortness of breath    with exertion  . SOB (shortness of breath) 06/21/2012    Current Meds  Medication Sig  . albuterol (VENTOLIN HFA) 108 (90 Base) MCG/ACT inhaler Inhale 2 puffs into the lungs every 6 (six) hours as needed for wheezing or shortness of breath.  Marland Kitchen atorvastatin (LIPITOR) 80 MG tablet TAKE ONE TABLET BY MOUTH DAILY AT 6 PM. (Patient taking differently: Take 80 mg by mouth daily at 6 PM. )  . butalbital-acetaminophen-caffeine (FIORICET, ESGIC) 50-325-40 MG per tablet Take 1  tablet by mouth every 4 (four) hours as needed for headache.  . esomeprazole (NEXIUM) 20 MG capsule Take 1 capsule (20 mg total) by mouth 2 (two) times daily before a meal.  . NITROSTAT 0.4 MG SL tablet PLACE 1 TABLET UNDER TONGUE FOR CHEST PAIN. MAY REPEAT EVERY 5 MIN UPTO 3 DOSES-NO RELIEF,CALL 911. (Patient taking differently: Place 0.4 mg under the tongue every 5 (five) minutes as needed for chest pain. )    ROS:  Review of Systems  HENT: Positive for ear pain and hearing loss.   Eyes: Negative.   Respiratory: Negative.   Cardiovascular: Negative.   Gastrointestinal: Negative.   Genitourinary: Negative.   Musculoskeletal: Negative.   Skin: Negative.   Neurological: Negative.   Endo/Heme/Allergies: Negative.   Psychiatric/Behavioral: Negative.        Not a good sleeper, might have a headache in middle of night. Sleep for two hours -voids a lot   All other systems reviewed and are negative.    Objective:   Today's Vitals: BP 112/74   Temp 98.2 F (36.8 C) (Temporal)   Ht 5\' 8"  (1.727 m)   Wt 170 lb (77.1 kg)   BMI 25.85 kg/m  Vitals with BMI 08/08/2019 06/07/2019 06/07/2019  Height 5\' 8"  - -  Weight 170 lbs - -  BMI XX123456 - -  Systolic XX123456 XX123456 [id number]  Diastolic 74 65 64  Pulse - 56 63     Physical Exam Vitals and nursing note reviewed.  Constitutional:      Appearance: Normal appearance. He is well-developed and well-groomed. He is obese.  HENT:     Head: Normocephalic and atraumatic.     Right Ear: Tympanic membrane, ear canal and external ear normal.     Left Ear: Tympanic membrane, ear canal and external ear normal.     Ears:     Comments: Bilateral hearing aids.    Mouth/Throat:     Comments: Mask in place   Eyes:     General:        Right eye: No discharge.        Left eye: No discharge.     Conjunctiva/sclera: Conjunctivae normal.  Cardiovascular:     Rate and Rhythm: Normal rate and regular rhythm.     Pulses: Normal pulses.     Heart sounds: Normal  heart sounds.  Pulmonary:     Effort: Pulmonary effort is normal.     Breath sounds: Normal breath sounds.  Musculoskeletal:        General: Normal range of motion.     Cervical  back: Normal range of motion and neck supple.  Skin:    General: Skin is warm.  Neurological:     General: No focal deficit present.     Mental Status: He is alert and oriented to person, place, and time.  Psychiatric:        Attention and Perception: Attention normal.        Mood and Affect: Mood normal.        Speech: Speech normal.        Behavior: Behavior normal. Behavior is cooperative.        Thought Content: Thought content normal.        Cognition and Memory: Cognition normal.        Judgment: Judgment normal.     Assessment   1. CAD S/P percutaneous coronary angioplasty: DES PCI to mLAD & OM1   2. Essential hypertension   3. Hyperlipidemia, unspecified hyperlipidemia type   4. Bilateral hearing loss, unspecified hearing loss type   5. Nocturia     Tests ordered No orders of the defined types were placed in this encounter.   Plan: Please see assessment and plan per problem list above.   No orders of the defined types were placed in this encounter.   Patient to follow-up in 6 months for annual with vision check.  Perlie Mayo, NP

## 2019-08-08 NOTE — Patient Instructions (Signed)
I appreciate the opportunity to provide you with care for your health and wellness. Today we discussed: established care  Follow up: 6 months for annual, vision   No labs today.   Please call the audiologist (hearing dr) to see if hearing aid is causing ear pain.  Talk with Urologist about getting up several times a night to use the restroom. Try and avoid drinking fluids 2-3 hours before bedtime.  Please continue to practice social distancing to keep you, your family, and our community safe.  If you must go out, please wear a mask and practice good handwashing.  It was a pleasure to see you and I look forward to continuing to work together on your health and well-being. Please do not hesitate to call the office if you need care or have questions about your care.  Have a wonderful day and week. With Gratitude, Cherly Beach, DNP, AGNP-BC

## 2019-08-12 DIAGNOSIS — R3912 Poor urinary stream: Secondary | ICD-10-CM | POA: Diagnosis not present

## 2019-08-12 DIAGNOSIS — N3020 Other chronic cystitis without hematuria: Secondary | ICD-10-CM | POA: Diagnosis not present

## 2019-08-13 ENCOUNTER — Encounter: Payer: Self-pay | Admitting: Family Medicine

## 2019-08-13 DIAGNOSIS — R351 Nocturia: Secondary | ICD-10-CM | POA: Insufficient documentation

## 2019-08-13 DIAGNOSIS — H9193 Unspecified hearing loss, bilateral: Secondary | ICD-10-CM | POA: Insufficient documentation

## 2019-08-13 NOTE — Assessment & Plan Note (Signed)
Denies having any angina.  Has good activity level.  Continue current treatment plans.

## 2019-08-13 NOTE — Assessment & Plan Note (Signed)
Bilateral hearing loss.  Has lost his left hearing aid.  Right hearing aid might be giving him trouble with the right ear.  Have advised for him to follow-up with his audiologist to see if there is something going on with hearing aid placement.  There is no noted signs of infection today when visualized

## 2019-08-13 NOTE — Assessment & Plan Note (Signed)
Is on Lipitor need to get updated labs.  We will get those into 6 months appointment for now.  Continue statin medication at this time.

## 2019-08-13 NOTE — Assessment & Plan Note (Signed)
Controlled, continue current treatment plan. Encouraged low-salt diet.  In addition to active exercise 30 minutes a day.

## 2019-08-13 NOTE — Assessment & Plan Note (Signed)
He is advised to follow-up with his urologist and let him know that he has some nocturia at night and see if there is anything that he can do to help him not have to wake up sometimes during the night.

## 2019-08-23 ENCOUNTER — Other Ambulatory Visit: Payer: Self-pay

## 2019-08-23 ENCOUNTER — Ambulatory Visit (HOSPITAL_COMMUNITY)
Admission: RE | Admit: 2019-08-23 | Discharge: 2019-08-23 | Disposition: A | Discharge status: Home / Self Care | Payer: Medicare Other | Source: Ambulatory Visit | Attending: Internal Medicine | Admitting: Internal Medicine

## 2019-08-23 DIAGNOSIS — I6523 Occlusion and stenosis of bilateral carotid arteries: Secondary | ICD-10-CM | POA: Diagnosis not present

## 2019-09-23 ENCOUNTER — Telehealth: Payer: Self-pay | Admitting: Cardiovascular Disease

## 2019-09-23 NOTE — Telephone Encounter (Signed)
Spoke with patient's daughter, Drema Balzarine, who called about getting the patient an appointment with Dr. Johnsie Cancel. I advised that Dr. Johnsie Cancel does not have any openings this week but that we can find an appointment with another provider depending on severity of symptoms. Daughter and wife are on speaker phone and describe that patient is very fatigued, has decreased appetite, constant cough with thick white mucous x 2 weeks. He had Covid-19 in January and daughter states he has not been well since that time.Does not weigh himself daily - reports no weight gain, looks like he lost weight. Eats very little food daily. I asked about fever and wife states patient will not allow them to take his temperature but he was wrapped up in blankets yesterday and c/o chills. I advised that the patient's symptoms are concerning and that he should seek urgent medical attention at an urgent care or emergency department.  I asked about follow-up with PCP and daughter states the person her father was seeing is gone. I advised that they could call the office to see if there are urgent appointments offered by another provider but that he should seek treatment today. Patient's family verbalized understanding and thanked me for the call.

## 2019-09-23 NOTE — Telephone Encounter (Signed)
Pt c/o swelling: STAT is pt has developed SOB within 24 hours  1) How much weight have you gained and in what time span? Pt does not weigh himself  2) If swelling, where is the swelling located? From his knee down  3) Are you currently taking a fluid pill?   4) Are you currently SOB? extremely  5) Do you have a log of your daily weights (if so, list)? no  6) Have you gained 3 pounds in a day or 5 pounds in a week? Pt does not have weights  7) Have you traveled recently? No   Pt c/o Shortness Of Breath: STAT if SOB developed within the last 24 hours or pt is noticeably SOB on the phone  1. Are you currently SOB (can you hear that pt is SOB on the phone)? yes  2. How long have you been experiencing SOB? Since his COVID Diagnosis   3. Are you SOB when sitting or when up moving around? All the time  4. Are you currently experiencing any other symptoms? Swelling, dizziness  Daughter called for the patient. The patient in addition to SOB and swelling is coughing up a thick mucus. He was in the hospital with COVID for about a week. He gets dizzy and woozy when he stands up and has to stabilize himself. He would like to be worked in to see Dr. Johnsie Cancel. He does not want to see a male NP

## 2019-09-24 NOTE — Telephone Encounter (Signed)
Joaquim Lai the patient's wife is calling requesting to speak with Sharyn Lull to see how soon she can get Redd squeezed in with Dr. Johnsie Cancel. She states if he is not able to be worked in he will take an appointment with a PA, but wanted to at least try for Dr. Johnsie Cancel first. Please advise.

## 2019-09-24 NOTE — Telephone Encounter (Signed)
Called patient back. Patient wants to see only Dr. Johnsie Cancel. Made patient an appointment on 11/18/19.

## 2019-11-05 NOTE — Progress Notes (Signed)
Cardiology Office Note:    Date:  11/18/2019   ID:  Brandon [name] [birth year], MRN [id number]  PCP:  Brandon [name]  Cardiologist:  Brandon Rouge, [name]  Referring [name]: Brandon [name]   No chief complaint on file.   History of Present Illness:    Brandon [name] is a 74 y.o. male with a past medical history significant for CAD s/p DES to LAD and OM1 2015, hypertension, carotid artery disease. Had non ischemic myovue 01/23/18.  Carotids 08/23/19 with plaque no stenosis in ICA;s With right subclavian velocity 3.17 m/sec. His EF has been normal Last TTE 01/12/18 65-70% with no significant valve disease   He had COVID in January Complicated by significant GI component with vomiting and diarrhea. 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 He uses a chain saw. Anginal equivalent is dyspnea.  Past Medical History:  Diagnosis Date  . Arthritis   . Blood transfusion    no trouble-51 years ago  . CAD S/P percutaneous coronary angioplasty: DES PCI to mLAD & OM1 04/03/2014   a. Lesion #1: (OM 1 90%) Promus Premier DES 2.25 x 12 mm (2.5 mm)  Lesion #2: (mid LAD 70% ) Promus Premier DES 2.5 x 16 mm  (2.75 mm)   . Cancer (HCC)    Skin  . Chest pain 06/21/2012  . Chronic headaches   . Chronic prostatitis   . COVID-19 06/03/2019  . Diverticulitis   . Fatigue 06/21/2012  . GERD (gastroesophageal reflux disease)   . Hematuria   . Hernia of other specified sites of abdominal cavity  without mention of obstruction or gangrene   . Hypertension   . Nausea vomiting and diarrhea   . Other specified forms of hearing loss   . Pneumonia due to COVID-19 virus 06/04/2019  . PONV (postoperative nausea and vomiting)   . Shortness of breath    with exertion  . SOB (shortness of breath) 06/21/2012    Past Surgical History:  Procedure Laterality Date  . CHOLECYSTECTOMY     aph-15 yrs ago0-Brandon  . CORONARY STENT PLACEMENT  04/03/2014   DES  OMI  &  LAD  . 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 INGUINAL HERNIA REPAIR  04/13/2011   Procedure: HERNIA REPAIR INGUINAL ADULT;  Surgeon: Brandon [name];  Location: [facility];  Service: General;  Laterality: Right;  . LEFT HEART CATHETERIZATION WITH CORONARY ANGIOGRAM N/A 04/03/2014   Procedure: LEFT HEART CATHETERIZATION WITH CORONARY ANGIOGRAM;  Surgeon: Brandon [name];  Location: [facility] CATH LAB;  Service: Cardiovascular;  Laterality: N/A;  . PERCUTANEOUS CORONARY STENT INTERVENTION (PCI-S)  04/03/2014   Procedure: PERCUTANEOUS CORONARY STENT INTERVENTION (PCI-S);  Surgeon: Brandon [name];  Location: [facility] CATH LAB;  Service: Cardiovascular;;  .  RECONSTRUCTION MID-FACE     left face    Current Medications: Current Meds  Medication Sig  . amLODipine (NORVASC) 10 MG tablet Take 1 tablet (10 mg total) by mouth daily.  Marland Kitchen atorvastatin (LIPITOR) 80 MG tablet TAKE ONE TABLET BY MOUTH DAILY AT 6 PM. (Patient taking differently: Take 80 mg by mouth daily at 6 PM. )  . butalbital-acetaminophen-caffeine (FIORICET) 50-325-40 MG tablet Take 1 tablet by mouth every 4 (four) hours as needed for headache.  . esomeprazole (NEXIUM) 20 MG capsule Take 1 capsule (20 mg total) by mouth 2 (two) times daily before a meal.  . NITROSTAT 0.4 MG SL tablet PLACE 1 TABLET UNDER TONGUE FOR CHEST PAIN. MAY REPEAT EVERY 5 MIN UPTO 3 DOSES-NO RELIEF,CALL 911. (Patient taking  differently: Place 0.4 mg under the tongue every 5 (five) minutes as needed for chest pain. )     Allergies:   Ciprofloxacin   Social History   Socioeconomic History  . Marital status: Married    Spouse name: Brandon [name]  . Number of children: 4  . Years of education: Not on file  . Highest education level: 11th grade  Occupational History  . Not on file  Tobacco Use  . Smoking status: Former Smoker    Packs/day: 2.00    Years: 14.00    Pack years: 28.00    Types: Cigarettes    Quit date: 04/10/1969    Years since quitting: 50.6  . Smokeless tobacco: Never Used  Substance and Sexual Activity  . Alcohol use: No  . Drug use: No  . Sexual activity: Not on file  Other Topics Concern  . Not on file  Social History Narrative   Lives with Brandon [name] (not in good health) married 74 years       4 children, 7 grandchildren, 80 Great grandchildren       Enjoys: fishing      Diet: eat all the food groups, cook at home some    Caffeine: 1 pot a day coffee in morning and 1 cup at night   Water: 2 cups daily      Wears seat belt-car yes, big truck no   Smoke detectors    Does not use phone while driving      Social Determinants of Radio broadcast assistant Strain:   . Difficulty of Paying Living Expenses:   Food Insecurity:   . Worried About Charity fundraiser in the Last Year:   . Arboriculturist in the Last Year:   Transportation Needs:   . Film/video editor (Medical):   Marland Kitchen Lack of Transportation (Non-Medical):   Physical Activity:   . Days of Exercise per Week:   . Minutes of Exercise per Session:   Stress: No Stress Concern Present  . Feeling of Stress : Only a little  Social Connections: Moderately Integrated  . Frequency of Communication with Friends and Family: Twice a week  . Frequency of Social Gatherings with Friends and Family: Twice a week  . Attends Religious Services: More than 4 times per year  . Active Member of Clubs or Organizations: No  . Attends  Archivist Meetings: Never  . Marital Status: Married     Family History: The patient's family history includes Heart attack in his father and mother; Heart disease in his father and mother; Hypertension in his father and mother. ROS:   Please see the history of present illness.     All other  systems reviewed and are negative.  EKGs/Labs/Other Studies Reviewed:    The following studies were reviewed today:  Stress myoview 01/23/2018  Nuclear stress EF: 68%.  There was no ST segment deviation noted during stress.  Defect 1: There is a small defect of mild severity present in the apex location.  The study is normal.  This is a low risk study.  The left ventricular ejection fraction is hyperdynamic (>65%).   Normal low risk stress nuclear study with mild apical thinning but no ischemia.  Gated ejection fraction 65% with normal wall motion.  Echocardiogram 01/12/2018 Study Conclusions - Left ventricle: The cavity size was normal. Wall thickness was   normal. Systolic function was vigorous. The estimated ejection   fraction was in the range of 65% to 70%. Wall motion was normal;   there were no regional wall motion abnormalities. Doppler   parameters are consistent with abnormal left ventricular   relaxation (grade 1 diastolic dysfunction). - Aorta: Ascending aortic diameter: 38 mm (S). - Ascending aorta: The ascending aorta was mildly dilated. - Mitral valve: There was trivial regurgitation. - Right atrium: The atrium was mildly dilated.  EKG:  EKG is ordered today.  The ekg ordered today demonstrates NSR, 69 bpm with no significant ST/T changes  Recent Labs: 06/06/2019: Magnesium 2.3 11/06/2019: ALT 25; Brain Natriuretic Peptide 16; BUN 17; Creat 1.25; Hemoglobin 13.4; Platelets 221; Potassium 4.3; Sodium 139   Recent Lipid Panel    Component Value Date/Time   CHOL 118 05/27/2014 0731   TRIG 95 06/03/2019 1538   HDL 33.60 (L) 05/27/2014 0731   CHOLHDL 4  05/27/2014 0731   VLDL 18.4 05/27/2014 0731   LDLCALC 66 05/27/2014 0731    Physical Exam:    VS:  BP 118/78   Pulse 69   Ht 5\' 8"  (1.727 m)   Wt 179 lb (81.2 kg)   SpO2 96%   BMI 27.22 kg/m     No data found.   Wt Readings from Last 3 Encounters:  11/18/19 179 lb (81.2 kg)  11/06/19 178 lb 12.8 oz (81.1 kg)  08/08/19 170 lb (77.1 kg)    Affect appropriate Healthy:  appears stated age HEENT: normal Neck supple with no adenopathy JVP normal no bruits no thyromegaly Lungs clear with no wheezing and good diaphragmatic motion Heart:  S1/S2 no murmur, no rub, gallop or click PMI normal Abdomen: benighn, BS positve, no tenderness, no AAA no bruit.  No HSM or HJR Distal pulses intact with no bruits No edema Neuro non-focal Skin warm and dry No muscular weakness   ASSESSMENT:    No diagnosis found. PLAN:    In order of problems listed above:  CAD with stable angina -S/p stenting of OM and LAD in 2015.  Last Myoview was normal in 12/2017.  Hypertension -BP has been low nitrates stopped 01/07/19   Hyperlipidemia -On Lipitor 80 mg daily labs with primary   PVD:  Carotids with plaque no stenosis right subclavian stenosis take BP's in left arm   Covid:  Long termer with chronic cough now that is causing atypical muscular chest pain that is not angina  Needs primary f/u ? F/u CT Tessalon Pearls/codeine for cough or referral to [facility] Discussed getting F/u vaccine   F/U in [place] in 6 months  Medication Adjustments/Labs and Tests Ordered: Current medicines are reviewed at length with the patient today.  Concerns regarding medicines are outlined above. Labs and tests ordered and medication changes are outlined  in the patient instructions below:  Patient Instructions  Medication Instructions:   *If you need a refill on your cardiac medications before your next appointment, please call your pharmacy*  Lab Work:  If you have labs (blood work) drawn  today and your tests are completely normal, you will receive your results only by: Marland Kitchen MyChart Message (if you have MyChart) OR . A paper copy in the mail If you have any lab test that is abnormal or we need to change your treatment, we will call you to review the results.  Testing/Procedures: None ordered today  Follow-Up: At [facility], you and your health needs are our priority.  As part of our continuing mission to provide you with exceptional heart care, we have created designated Provider Care Teams.  These Care Teams include your primary Cardiologist (physician) and Advanced Practice Providers (APPs -  Physician Assistants and Nurse Practitioners) who all work together to provide you with the care you need, when you need it.  We recommend signing up for the patient portal called "MyChart".  Sign up information is provided on this After Visit Summary.  MyChart is used to connect with patients for Virtual Visits (Telemedicine).  Patients are able to view lab/test results, encounter notes, upcoming appointments, etc.  Non-urgent messages can be sent to your provider as well.   To learn more about what you can do with MyChart, go to [email or web address].    Your next appointment:   6 month(s)  The format for your next appointment:   In Person  Provider:   You may see Brandon Rouge, [name] or one of the following Advanced Practice Providers on your designated Care Team:    Truitt Merle, [name]  Cecilie Kicks, [name]  Kathyrn Drown, [name]        Signed, Brandon Rouge, [name]  11/18/2019 8:15 AM    [place]

## 2019-11-06 ENCOUNTER — Other Ambulatory Visit: Payer: Self-pay | Admitting: *Deleted

## 2019-11-06 ENCOUNTER — Ambulatory Visit (INDEPENDENT_AMBULATORY_CARE_PROVIDER_SITE_OTHER): Payer: Medicare Other | Admitting: Family Medicine

## 2019-11-06 ENCOUNTER — Other Ambulatory Visit: Payer: Self-pay

## 2019-11-06 ENCOUNTER — Encounter: Payer: Self-pay | Admitting: Family Medicine

## 2019-11-06 ENCOUNTER — Ambulatory Visit (HOSPITAL_COMMUNITY)
Admission: RE | Admit: 2019-11-06 | Discharge: 2019-11-06 | Disposition: A | Discharge status: Home / Self Care | Payer: Medicare Other | Source: Ambulatory Visit | Attending: Family Medicine | Admitting: Family Medicine

## 2019-11-06 VITALS — BP 109/68 | HR 70 | Temp 97.5°F | Ht 68.0 in | Wt 178.8 lb

## 2019-11-06 DIAGNOSIS — H9193 Unspecified hearing loss, bilateral: Secondary | ICD-10-CM

## 2019-11-06 DIAGNOSIS — R05 Cough: Secondary | ICD-10-CM

## 2019-11-06 DIAGNOSIS — R071 Chest pain on breathing: Secondary | ICD-10-CM | POA: Diagnosis not present

## 2019-11-06 DIAGNOSIS — Z0184 Encounter for antibody response examination: Secondary | ICD-10-CM

## 2019-11-06 DIAGNOSIS — I10 Essential (primary) hypertension: Secondary | ICD-10-CM

## 2019-11-06 DIAGNOSIS — R059 Cough, unspecified: Secondary | ICD-10-CM | POA: Insufficient documentation

## 2019-11-06 DIAGNOSIS — M7989 Other specified soft tissue disorders: Secondary | ICD-10-CM | POA: Diagnosis not present

## 2019-11-06 DIAGNOSIS — R058 Other specified cough: Secondary | ICD-10-CM

## 2019-11-06 DIAGNOSIS — R0602 Shortness of breath: Secondary | ICD-10-CM | POA: Insufficient documentation

## 2019-11-06 DIAGNOSIS — R079 Chest pain, unspecified: Secondary | ICD-10-CM | POA: Diagnosis not present

## 2019-11-06 MED ORDER — BUTALBITAL-APAP-CAFFEINE 50-325-40 MG PO TABS: 1.0000 | ORAL_TABLET | ORAL | 0 refills | Status: DC | PRN

## 2019-11-06 MED ORDER — AMLODIPINE BESYLATE 10 MG PO TABS: 10.0000 mg | ORAL_TABLET | Freq: Every day | ORAL | 3 refills | Status: DC

## 2019-11-06 NOTE — Assessment & Plan Note (Signed)
Chest discomfort predominantly related to deep breath and cough. No signs or symptoms today present of chest pain or MI. Pending x-ray will look at possibly [facility] indicated EKG and or back to cardiology.

## 2019-11-06 NOTE — Patient Instructions (Signed)
I appreciate the opportunity to provide you with care for your health and wellness. Today we discussed:  Chest discomfort, shortness of breath, cough   Follow up: 3 months  Labs and Chest xray today at [facility] Referral to ENT-hearing aids  Please use your inhaler before walking or doing activity. This will help with your shortness of breath.  We will call once we have your results.  Please continue to practice social distancing to keep you, your family, and our community safe.  If you must go out, please wear a mask and practice good handwashing.  It was a pleasure to see you and I look forward to continuing to work together on your health and well-being. Please do not hesitate to call the office if you need care or have questions about your care.  Have a wonderful day and week. With Gratitude, Cherly Beach, DNP, AGNP-BC

## 2019-11-06 NOTE — Assessment & Plan Note (Signed)
Leg swelling with very trace and minimal today on exam.  Will get BMP for good measure.  Chest x-ray ordered as well.  Did not note any edema on PE.  Pending results will follow up with treatment as appropriate.

## 2019-11-06 NOTE — Assessment & Plan Note (Signed)
Shortness of breath especially with exertion.  Reports that he was not like his pre-Covid.  Still has a cough at times.  Not use his inhaler.  Did have rhonchi and wheezing on exam.  X-ray and labs ordered.

## 2019-11-06 NOTE — Progress Notes (Signed)
Subjective:  Patient ID: Brandon [name], male    DOB: 03-29-1946  Age: 74 y.o. MRN: [id number]  CC:  Chief Complaint  Patient presents with   Foot Swelling    swelling in his feet and legs x80months left is worse than right swells at night not swollen right now    Headache    keeps headache keeps taking tylenol nothing is heling    Follow-up    following up from covid positive he still feels like a stabbing pain in the chest on both sides and right below shoulder blades this comes and goes       HPI  HPI  Brandon [name] is a 74 year old male patient of mine.  He presents today with leg swelling that is increased over the last 3 months.  He reports that they are not very swollen right now.  But that he usually does have swelling during the day.  He reports that they are worse when he is upright on them a lot or when he is wearing his boots.  He reports he keeps having his headaches and that nothing is helping he is out of his Fioricet that Dr. Luan Pulling previously had him on.  He reports that his blood pressure has been normal.  And has not had any vision changes.  He denies having any dizziness at this time.  He reports that he has a chest discomfort that occurs in the central area of each lung in the front and sometimes in the back predominantly on the left side underneath the shoulder.  He reports the discomfort is not continuous.  It does happen at times when he takes a deep breath.  He denies having any nausea or vomiting.  He denies having any sweating or radiating pain into the jaw or arm with this discomfort.  He reports some shortness of breath especially on exertion.  He reports that worsened after he had Covid earlier this year.  He denies having any palpitations.  He denies having any wheezing.  He reports that he does have a cough that has sputum production which is an off-white grayish color at times.  He denies that it is frothy.  He reports he is not been using his inhaler but  has had since having Covid.  Today patient denies signs and symptoms of COVID 19 infection including fever, chills, cough, shortness of breath, and headache. Past Medical, Surgical, Social History, Allergies, and Medications have been Reviewed.   Past Medical History:  Diagnosis Date   Arthritis    Blood transfusion    no trouble-51 years ago   CAD S/P percutaneous coronary angioplasty: DES PCI to mLAD & OM1 04/03/2014   a. Lesion #1: (OM 1 90%) Promus Premier DES 2.25 x 12 mm (2.5 mm)  Lesion #2: (mid LAD 70% ) Promus Premier DES 2.5 x 16 mm  (2.75 mm)    Cancer (HCC)    Skin   Chest pain 06/21/2012   Chronic headaches    Chronic prostatitis    COVID-19 06/03/2019   Diverticulitis    Fatigue 06/21/2012   GERD (gastroesophageal reflux disease)    Hematuria    Hernia of other specified sites of abdominal cavity without mention of obstruction or gangrene    Hypertension    Nausea vomiting and diarrhea    Other specified forms of hearing loss    Pneumonia due to COVID-19 virus 06/04/2019   PONV (postoperative nausea and vomiting)    Shortness  of breath    with exertion   SOB (shortness of breath) 06/21/2012    Current Meds  Medication Sig   atorvastatin (LIPITOR) 80 MG tablet TAKE ONE TABLET BY MOUTH DAILY AT 6 PM. (Patient taking differently: Take 80 mg by mouth daily at 6 PM. )   esomeprazole (NEXIUM) 20 MG capsule Take 1 capsule (20 mg total) by mouth 2 (two) times daily before a meal.   NITROSTAT 0.4 MG SL tablet PLACE 1 TABLET UNDER TONGUE FOR CHEST PAIN. MAY REPEAT EVERY 5 MIN UPTO 3 DOSES-NO RELIEF,CALL 911. (Patient taking differently: Place 0.4 mg under the tongue every 5 (five) minutes as needed for chest pain. )   [DISCONTINUED] butalbital-acetaminophen-caffeine (FIORICET, ESGIC) 50-325-40 MG per tablet Take 1 tablet by mouth every 4 (four) hours as needed for headache.    ROS:  Review of Systems  Constitutional: Negative.   HENT: Negative.     Eyes: Negative.   Respiratory: Positive for cough, sputum production and shortness of breath.   Cardiovascular: Positive for chest pain and leg swelling.  Gastrointestinal: Negative.   Genitourinary: Negative.   Musculoskeletal: Negative.   Skin: Negative.   Neurological: Negative.   Endo/Heme/Allergies: Negative.   Psychiatric/Behavioral: Negative.   All other systems reviewed and are negative.    Objective:   Today's Vitals: BP 109/68 (BP Location: Left Arm, Patient Position: Sitting, Cuff Size: Normal)    Pulse 70    Temp (!) 97.5 F (36.4 C) (Temporal)    Ht 5\' 8"  (1.727 m)    Wt 178 lb 12.8 oz (81.1 kg)    SpO2 96%    BMI 27.19 kg/m  Vitals with BMI 11/06/2019 08/08/2019 06/07/2019  Height 5\' 8"  5\' 8"  -  Weight 178 lbs 13 oz 170 lbs -  BMI 67.34 19.37 -  Systolic 902 409 735  Diastolic 68 74 65  Pulse 70 - 56     Physical Exam Vitals and nursing note reviewed.  Constitutional:      Appearance: Normal appearance. He is well-developed and well-groomed. He is obese.  HENT:     Head: Normocephalic and atraumatic.     Right Ear: Ear canal and external ear normal.     Left Ear: External ear normal.     Ears:     Comments: Bilateral hard of hearing    Mouth/Throat:     Comments: Mask in place  Eyes:     General:        Right eye: No discharge.        Left eye: No discharge.     Conjunctiva/sclera: Conjunctivae normal.  Cardiovascular:     Rate and Rhythm: Normal rate and regular rhythm.     Pulses: Normal pulses.     Heart sounds: Normal heart sounds.     Comments: Trace sock line edema. Pulmonary:     Effort: Pulmonary effort is normal.     Breath sounds: Examination of the right-middle field reveals wheezing and rhonchi. Wheezing and rhonchi present.  Musculoskeletal:        General: Normal range of motion.     Cervical back: Normal range of motion and neck supple.  Skin:    General: Skin is warm.  Neurological:     General: No focal deficit present.      Mental Status: He is alert and oriented to person, place, and time.  Psychiatric:        Attention and Perception: Attention normal.  Mood and Affect: Mood normal.        Speech: Speech normal.        Behavior: Behavior normal. Behavior is cooperative.        Thought Content: Thought content normal.        Cognition and Memory: Cognition normal.        Judgment: Judgment normal.     Assessment   1. Shortness of breath   2. Cough with sputum   3. Essential hypertension   4. Bilateral hearing loss, unspecified hearing loss type   5. Chest pain on breathing   6. Leg swelling   7. COVID-19 virus IgG antibody test result unknown     Tests ordered Orders Placed This Encounter  Procedures   DG Chest 2 View   CBC   Comprehensive Metabolic Panel (CMET)   B Nat Peptide   SARS-CoV-2 Semi-Quantitative Total Antibody, Spike     Plan: Please see assessment and plan per problem list above.   No orders of the defined types were placed in this encounter.   Patient to follow-up in 3 months .  Perlie Mayo, NP

## 2019-11-06 NOTE — Assessment & Plan Note (Signed)
Bilateral hearing loss.  He lost his left hearing aid, right hearing aid has been giving him trouble in his right ear but he just lost it yesterday.  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

## 2019-11-06 NOTE — Assessment & Plan Note (Signed)
Controlled, continue current treatment plan.  Encourage low-fat low-salt diet.  In addition to exercise 30 minutes daily.

## 2019-11-06 NOTE — Assessment & Plan Note (Signed)
X-ray labs ordered.  Possible unresolved pneumonia versus some other infectious process.  If normal without any infectious process noted.  Referral to pulmonary for assessment of changes in lungs secondary to Covid infection.  Encouraged him to use his inhaler prior to any activity.

## 2019-11-07 LAB — CBC
HCT: 40 % (ref 38.5–50.0)
Hemoglobin: 13.4 g/dL (ref 13.2–17.1)
MCH: 29.1 pg (ref 27.0–33.0)
MCHC: 33.5 g/dL (ref 32.0–36.0)
MCV: 86.8 fL (ref 80.0–100.0)
MPV: 9.8 fL (ref 7.5–12.5)
Platelets: 221 10*3/uL (ref 140–400)
RBC: 4.61 10*6/uL (ref 4.20–5.80)
RDW: 11.8 % (ref 11.0–15.0)
WBC: 7.2 10*3/uL (ref 3.8–10.8)

## 2019-11-07 LAB — COMPREHENSIVE METABOLIC PANEL
AG Ratio: 1.7 (calc) (ref 1.0–2.5)
ALT: 25 U/L (ref 9–46)
AST: 21 U/L (ref 10–35)
Albumin: 4.2 g/dL (ref 3.6–5.1)
Alkaline phosphatase (APISO): 80 U/L (ref 35–144)
BUN/Creatinine Ratio: 14 (calc) (ref 6–22)
BUN: 17 mg/dL (ref 7–25)
CO2: 24 mmol/L (ref 20–32)
Calcium: 9.3 mg/dL (ref 8.6–10.3)
Chloride: 108 mmol/L (ref 98–110)
Creat: 1.25 mg/dL — ABNORMAL HIGH (ref 0.70–1.18)
Globulin: 2.5 g/dL (calc) (ref 1.9–3.7)
Glucose, Bld: 97 mg/dL (ref 65–139)
Potassium: 4.3 mmol/L (ref 3.5–5.3)
Sodium: 139 mmol/L (ref 135–146)
Total Bilirubin: 0.4 mg/dL (ref 0.2–1.2)
Total Protein: 6.7 g/dL (ref 6.1–8.1)

## 2019-11-07 LAB — BRAIN NATRIURETIC PEPTIDE: Brain Natriuretic Peptide: 16 pg/mL (ref <100)

## 2019-11-08 ENCOUNTER — Other Ambulatory Visit: Payer: Self-pay | Admitting: *Deleted

## 2019-11-08 DIAGNOSIS — I10 Essential (primary) hypertension: Secondary | ICD-10-CM

## 2019-11-18 ENCOUNTER — Ambulatory Visit (INDEPENDENT_AMBULATORY_CARE_PROVIDER_SITE_OTHER): Payer: Medicare Other | Admitting: Cardiovascular Disease

## 2019-11-18 ENCOUNTER — Encounter: Payer: Self-pay | Admitting: Cardiovascular Disease

## 2019-11-18 ENCOUNTER — Other Ambulatory Visit: Payer: Self-pay

## 2019-11-18 VITALS — BP 118/78 | HR 69 | Ht 68.0 in | Wt 179.0 lb

## 2019-11-18 DIAGNOSIS — I2510 Atherosclerotic heart disease of native coronary artery without angina pectoris: Secondary | ICD-10-CM | POA: Diagnosis not present

## 2019-11-18 DIAGNOSIS — Z9861 Coronary angioplasty status: Secondary | ICD-10-CM | POA: Diagnosis not present

## 2019-11-18 NOTE — Patient Instructions (Signed)

## 2019-11-22 ENCOUNTER — Telehealth: Payer: Self-pay

## 2019-11-22 LAB — COMPLETE METABOLIC PANEL WITH GFR
AG Ratio: 1.7 (calc) (ref 1.0–2.5)
ALT: 27 U/L (ref 9–46)
AST: 20 U/L (ref 10–35)
Albumin: 4.3 g/dL (ref 3.6–5.1)
Alkaline phosphatase (APISO): 74 U/L (ref 35–144)
BUN: 15 mg/dL (ref 7–25)
CO2: 26 mmol/L (ref 20–32)
Calcium: 9.4 mg/dL (ref 8.6–10.3)
Chloride: 106 mmol/L (ref 98–110)
Creat: 0.97 mg/dL (ref 0.70–1.18)
GFR, Est African American: 88 mL/min/{1.73_m2} (ref >=60)
GFR, Est Non African American: 76 mL/min/{1.73_m2} (ref >=60)
Globulin: 2.6 g/dL (calc) (ref 1.9–3.7)
Glucose, Bld: 113 mg/dL — ABNORMAL HIGH (ref 65–99)
Potassium: 4.2 mmol/L (ref 3.5–5.3)
Sodium: 139 mmol/L (ref 135–146)
Total Bilirubin: 0.7 mg/dL (ref 0.2–1.2)
Total Protein: 6.9 g/dL (ref 6.1–8.1)

## 2019-11-22 NOTE — Telephone Encounter (Signed)
PT came in the office, wanted to  Know what the blood work was for. I advised him per the notes that it was checking his kidney levels.  Pt said he went to a Dr Monday -and told him that he need to have his cholesterol checked. Also the pt is having spells, and he feels like he is drunk.   Pt wants a return call

## 2019-11-22 NOTE — Telephone Encounter (Signed)
Reached out to pt for more information got na nvm

## 2019-11-26 NOTE — Telephone Encounter (Signed)
Reached pt for lab results he never mentioned anything about being drunk

## 2019-11-28 ENCOUNTER — Telehealth: Payer: Self-pay

## 2019-11-28 ENCOUNTER — Telehealth: Payer: Self-pay | Admitting: Family Medicine

## 2019-11-28 NOTE — Telephone Encounter (Signed)
Needs appt. Dr's will never call in antibiotics without an appt

## 2019-11-28 NOTE — Telephone Encounter (Signed)
Pt called to [facility] appt, I advised that we needed a urine to properly treat the infection if that is what it was.   He advised that Dr Luan Pulling would just call the antiboitics in . He was not coming in he  would just tough it out

## 2019-11-28 NOTE — Telephone Encounter (Signed)
Pt stated they have a UTI and wants to know if Rx can be called in.

## 2019-11-29 ENCOUNTER — Telehealth: Payer: Medicare Other | Admitting: Family Medicine

## 2019-12-27 ENCOUNTER — Other Ambulatory Visit: Payer: Self-pay | Admitting: Cardiovascular Disease

## 2020-02-07 ENCOUNTER — Telehealth: Payer: Self-pay

## 2020-02-07 ENCOUNTER — Ambulatory Visit: Payer: Medicare Other | Admitting: Family Medicine

## 2020-02-07 NOTE — Telephone Encounter (Signed)
Chart updated

## 2020-02-07 NOTE — Telephone Encounter (Signed)
No to the FLU SHOT

## 2020-03-04 ENCOUNTER — Ambulatory Visit (INDEPENDENT_AMBULATORY_CARE_PROVIDER_SITE_OTHER): Payer: Medicare Other | Admitting: Family Medicine

## 2020-03-04 ENCOUNTER — Encounter: Payer: Self-pay | Admitting: Family Medicine

## 2020-03-04 ENCOUNTER — Ambulatory Visit (HOSPITAL_COMMUNITY)
Admission: RE | Admit: 2020-03-04 | Discharge: 2020-03-04 | Disposition: A | Discharge status: Home / Self Care | Payer: Medicare Other | Source: Ambulatory Visit | Attending: Family Medicine | Admitting: Family Medicine

## 2020-03-04 ENCOUNTER — Other Ambulatory Visit: Payer: Self-pay

## 2020-03-04 ENCOUNTER — Other Ambulatory Visit (HOSPITAL_COMMUNITY)
Admission: RE | Admit: 2020-03-04 | Discharge: 2020-03-04 | Disposition: A | Discharge status: Home / Self Care | Payer: Medicare Other | Source: Ambulatory Visit | Attending: Family Medicine | Admitting: Family Medicine

## 2020-03-04 VITALS — BP 115/71 | HR 70 | Resp 16 | Ht 68.0 in | Wt 179.1 lb

## 2020-03-04 DIAGNOSIS — Z8616 Personal history of COVID-19: Secondary | ICD-10-CM

## 2020-03-04 DIAGNOSIS — R5383 Other fatigue: Secondary | ICD-10-CM

## 2020-03-04 DIAGNOSIS — R0602 Shortness of breath: Secondary | ICD-10-CM | POA: Diagnosis not present

## 2020-03-04 DIAGNOSIS — R059 Cough, unspecified: Secondary | ICD-10-CM | POA: Diagnosis not present

## 2020-03-04 HISTORY — DX: Personal history of COVID-19: Z86.16

## 2020-03-04 LAB — COMPREHENSIVE METABOLIC PANEL
ALT: 26 U/L (ref 0–44)
AST: 23 U/L (ref 15–41)
Albumin: 4.2 g/dL (ref 3.5–5.0)
Alkaline Phosphatase: 63 U/L (ref 38–126)
Anion gap: 7 (ref 5–15)
BUN: 13 mg/dL (ref 8–23)
CO2: 25 mmol/L (ref 22–32)
Calcium: 9.2 mg/dL (ref 8.9–10.3)
Chloride: 104 mmol/L (ref 98–111)
Creatinine, Ser: 1.12 mg/dL (ref 0.61–1.24)
GFR calc non Af Amer: >60 mL/min (ref >60)
Glucose, Bld: 106 mg/dL — ABNORMAL HIGH (ref 70–99)
Potassium: 3.9 mmol/L (ref 3.5–5.1)
Sodium: 136 mmol/L (ref 135–145)
Total Bilirubin: 0.9 mg/dL (ref 0.3–1.2)
Total Protein: 7.4 g/dL (ref 6.5–8.1)

## 2020-03-04 LAB — CBC
HCT: 40.6 % (ref 39.0–52.0)
Hemoglobin: 13.3 g/dL (ref 13.0–17.0)
MCH: 30.3 pg (ref 26.0–34.0)
MCHC: 32.8 g/dL (ref 30.0–36.0)
MCV: 92.5 fL (ref 80.0–100.0)
Platelets: 203 10*3/uL (ref 150–400)
RBC: 4.39 MIL/uL (ref 4.22–5.81)
RDW: 12.1 % (ref 11.5–15.5)
WBC: 7.2 10*3/uL (ref 4.0–10.5)
nRBC: 0 % (ref 0.0–0.2)

## 2020-03-04 MED ORDER — ALBUTEROL SULFATE HFA 108 (90 BASE) MCG/ACT IN AERS: 2.0000 | INHALATION_SPRAY | Freq: Four times a day (QID) | RESPIRATORY_TRACT | 2 refills | Status: DC | PRN

## 2020-03-04 NOTE — Patient Instructions (Addendum)
  HAPPY FALL!  I appreciate the opportunity to provide you with care for your health and wellness. Today we discussed: ongoing cough, shortness of breath and fatigue  Follow up: 2-3 weeks    Labs- at [facility] for Chest Xray  No referrals today  Use inhaler as directed. If you are having coughing and shortness of breath please use it.  Please continue to practice social distancing to keep you, your family, and our community safe.  If you must go out, please wear a mask and practice good handwashing.  It was a pleasure to see you and I look forward to continuing to work together on your health and well-being. Please do not hesitate to call the office if you need care or have questions about your care.  Have a wonderful day and week. With Gratitude, Cherly Beach, DNP, AGNP-BC

## 2020-03-04 NOTE — Assessment & Plan Note (Signed)
Hx of Covid in Jan of this year 2021 Reports an increased shortness of breath with exertion. He is still not using an inhaler. PE today is overall unremarkable, but due to mucus production, increased shortness of breath with fatigue xray and labs ordered to r/u infection. Ordered albuterol and will consider steroid inhaler and referral to pulmonary if needed.

## 2020-03-04 NOTE — Assessment & Plan Note (Signed)
Will check labs to make sure infection not cause. Also xray. Still not using inhaler as directed before. Ordered new one for him, advised him to use it and not give to wife.  Questionable cause of fatigue if labs normal, post covid symptom?

## 2020-03-04 NOTE — Assessment & Plan Note (Signed)
Again increased shortness of breath with exertion. He is still not using an inhaler. PE today is overall unremarkable, but due to mucus production, increased shortness of breath with fatigue xray and labs ordered to r/u infection. Ordered albuterol and will consider steroid inhaler and referral to pulmonary if needed.  He is encouraged and education on when to use inhaler.

## 2020-03-04 NOTE — Progress Notes (Signed)
Subjective:  Patient ID: Brandon [name], male    DOB: 09-Apr-1946  Age: 74 y.o. MRN: [id number]  CC:  Chief Complaint  Patient presents with  . Shortness of Breath    has had since he was diagnosed with Covid in Jan 2021      HPI  HPI  Mr. Durio is a 74 year old male patient who presents today for an acute visit after having ongoing increased shortness of breath.  He reports that he had Covid back in January 2021.  He has not had the Covid vaccine since having Covid.  He reports he does not want his flu shot today either.  He says that he started improving a few weeks back. But he recently has had increased shortness of breath, increasing fatigue, coughing so hard that he feels lightheaded and gets a pounding headache.  He reports he gave his inhaler to his wife who had Covid as well but had more symptoms than him.  She sees a pulmonologist.  He was never referred to a pulmonologist as he only had a slightly lingering cough. She reports the shortness of breath happens a lot with he is trying to do normal activities in his yard or around the house. He does have some mucus production. No blood noted per him. He denies active chest pain, recent sick contacts, or trouble with sinuses.   Today patient has some overlapping signs and symptoms of COVID 19 infection including fever, chills, cough, shortness of breath, and headache. Past Medical, Surgical, Social History, Allergies, and Medications have been Reviewed.   Past Medical History:  Diagnosis Date  . Arthritis   . Blood transfusion    no trouble-51 years ago  . CAD S/P percutaneous coronary angioplasty: DES PCI to mLAD & OM1 04/03/2014   a. Lesion #1: (OM 1 90%) Promus Premier DES 2.25 x 12 mm (2.5 mm)  Lesion #2: (mid LAD 70% ) Promus Premier DES 2.5 x 16 mm  (2.75 mm)   . Cancer (HCC)    Skin  . Chest pain 06/21/2012  . Chronic headaches   . Chronic prostatitis   . COVID-19 06/03/2019  . Diverticulitis   . Fatigue 06/21/2012   . GERD (gastroesophageal reflux disease)   . Hematuria   . Hernia of other specified sites of abdominal cavity without mention of obstruction or gangrene   . Hypertension   . Nausea vomiting and diarrhea   . Other specified forms of hearing loss   . Pneumonia due to COVID-19 virus 06/04/2019  . PONV (postoperative nausea and vomiting)   . Shortness of breath    with exertion  . SOB (shortness of breath) 06/21/2012    Current Meds  Medication Sig  . alfuzosin (UROXATRAL) 10 MG 24 hr tablet Take 10 mg by mouth daily.  Marland Kitchen amLODipine (NORVASC) 10 MG tablet Take 1 tablet (10 mg total) by mouth daily.  Marland Kitchen atorvastatin (LIPITOR) 80 MG tablet TAKE ONE TABLET BY MOUTH DAILY AT 6 PM.  . butalbital-acetaminophen-caffeine (FIORICET) 50-325-40 MG tablet Take 1 tablet by mouth every 4 (four) hours as needed for headache.  . esomeprazole (NEXIUM) 20 MG capsule Take 1 capsule (20 mg total) by mouth 2 (two) times daily before a meal.  . NITROSTAT 0.4 MG SL tablet PLACE 1 TABLET UNDER TONGUE FOR CHEST PAIN. MAY REPEAT EVERY 5 MIN UPTO 3 DOSES-NO RELIEF,CALL 911. (Patient taking differently: Place 0.4 mg under the tongue every 5 (five) minutes as needed for chest pain. )  ROS:  Review of Systems  Constitutional: Negative.   HENT: Negative.   Eyes: Negative.   Respiratory: Positive for cough and shortness of breath.   Cardiovascular: Negative.   Gastrointestinal: Negative.   Genitourinary: Negative.   Musculoskeletal: Negative.   Skin: Negative.   Neurological: Negative.   Endo/Heme/Allergies: Negative.   Psychiatric/Behavioral: Negative.      Objective:   Today's Vitals: BP 115/71   Pulse 70   Resp 16   Ht 5\' 8"  (1.727 m)   Wt 179 lb 1.3 oz (81.2 kg)   SpO2 98%   BMI 27.23 kg/m  Vitals with BMI 03/04/2020 11/18/2019 11/06/2019  Height 5\' 8"  5\' 8"  5\' 8"   Weight 179 lbs 1 oz 179 lbs 178 lbs 13 oz  BMI 27.24 70.26 37.85  Systolic 885 027 741  Diastolic 71 78 68  Pulse 70 69 70      Physical Exam Vitals and nursing note reviewed.  Constitutional:      Appearance: Normal appearance. He is well-developed, well-groomed and overweight.  HENT:     Head: Normocephalic and atraumatic.     Right Ear: External ear normal.     Left Ear: External ear normal.     Nose: Nose normal.     Mouth/Throat:     Mouth: Mucous membranes are moist.     Pharynx: Oropharynx is clear.  Eyes:     General:        Right eye: No discharge.        Left eye: No discharge.     Conjunctiva/sclera: Conjunctivae normal.  Cardiovascular:     Rate and Rhythm: Normal rate and regular rhythm.     Pulses: Normal pulses.     Heart sounds: Normal heart sounds.  Pulmonary:     Effort: Pulmonary effort is normal.     Breath sounds: Normal air entry. Examination of the left-upper field reveals decreased breath sounds. Examination of the left-middle field reveals decreased breath sounds. Decreased breath sounds present. No wheezing, rhonchi or rales.  Musculoskeletal:        General: Normal range of motion.     Cervical back: Normal range of motion and neck supple.  Skin:    General: Skin is warm.  Neurological:     General: No focal deficit present.     Mental Status: He is alert and oriented to person, place, and time.  Psychiatric:        Attention and Perception: Attention and perception normal.        Mood and Affect: Mood and affect normal.        Speech: Speech normal.        Behavior: Behavior normal. Behavior is cooperative.        Thought Content: Thought content normal.        Cognition and Memory: Cognition and memory normal.        Judgment: Judgment normal.      Assessment   1. History of COVID-19   2. Cough   3. Shortness of breath   4. Fatigue, unspecified type     Tests ordered Orders Placed This Encounter  Procedures  . DG Chest 2 View  . CBC  . Comprehensive metabolic panel     Plan: Please see assessment and plan per problem list above.   Meds ordered  this encounter  Medications  . albuterol (VENTOLIN HFA) 108 (90 Base) MCG/ACT inhaler    Sig: Inhale 2 puffs into the lungs every 6 (six)  hours as needed for wheezing or shortness of breath.    Dispense:  8 g    Refill:  2    Order Specific Question:   Supervising Provider    Answer:   Fayrene Helper [[id number]]    Patient to follow-up in 2-3 weeks   Perlie Mayo, NP

## 2020-03-04 NOTE — Assessment & Plan Note (Signed)
Again increased shortness of breath with exertion. He is still not using an inhaler. PE today is overall unremarkable, but due to mucus production, increased shortness of breath with fatigue xray and labs ordered to r/u infection. Ordered albuterol and will consider steroid inhaler and referral to pulmonary if needed.

## 2020-03-25 ENCOUNTER — Ambulatory Visit (INDEPENDENT_AMBULATORY_CARE_PROVIDER_SITE_OTHER): Payer: Medicare Other | Admitting: Family Medicine

## 2020-03-25 ENCOUNTER — Encounter: Payer: Self-pay | Admitting: Family Medicine

## 2020-03-25 ENCOUNTER — Other Ambulatory Visit: Payer: Self-pay

## 2020-03-25 VITALS — BP 124/70 | HR 68 | Ht 68.0 in | Wt 182.0 lb

## 2020-03-25 DIAGNOSIS — R0602 Shortness of breath: Secondary | ICD-10-CM

## 2020-03-25 DIAGNOSIS — M549 Dorsalgia, unspecified: Secondary | ICD-10-CM

## 2020-03-25 DIAGNOSIS — G8929 Other chronic pain: Secondary | ICD-10-CM

## 2020-03-25 DIAGNOSIS — M25562 Pain in left knee: Secondary | ICD-10-CM

## 2020-03-25 DIAGNOSIS — I10 Essential (primary) hypertension: Secondary | ICD-10-CM | POA: Diagnosis not present

## 2020-03-25 NOTE — Patient Instructions (Signed)
  HAPPY FALL!  I appreciate the opportunity to provide you with care for your health and wellness. Today we discussed: several concerns   Follow up: 6 weeks for neck, upper back pain and left knee pains  No labs or referrals today  Please start using inhaler prior to working around the house.  If you forget use the inhaler once you are feeling short of breath.  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  Shoulder Posture Brace   Please continue to practice social distancing to keep you, your family, and our community safe.  If you must go out, please wear a mask and practice good handwashing.  It was a pleasure to see you and I look forward to continuing to work together on your health and well-being. Please do not hesitate to call the office if you need care or have questions about your care.  Have a wonderful day and week. With Gratitude, Cherly Beach, DNP, AGNP-BC

## 2020-03-25 NOTE — Assessment & Plan Note (Signed)
I have encouraged him to use muscle cream, correct his posture since he hunches over at times.  Unable to find a posture brace at local pharmacy.  Unsure of cause for him outside the pocket.  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  Or position or job change.  Possible referral to Ortho if not improved.  Would like to refrain from muscle relaxers given age and fall risk.

## 2020-03-25 NOTE — Assessment & Plan Note (Signed)
On going dyspnea with exertion.  He still not using his inhaler after education at last visit.  He reports that he will will get taking Avelox this time.  Detailed instructions were provided today on how to use it.  He additionally was unremarkable on exam outside of some diminished decreased air sounds.  X-ray and labs at previous visit were unremarkable and reassuring.  I do not think he needs a steroid inhaler at this time or referral to pulmonary until we see if albuterol is helpful.

## 2020-03-25 NOTE — Assessment & Plan Note (Signed)
Brandon [name] is encouraged to maintain a well balanced diet that is low in salt. Controlled, continue current medication regimen. No refills needed  Additionally, he is also reminded that exercise is beneficial for heart health and control of  Blood pressure. 30-60 minutes daily is recommended-walking was suggested.

## 2020-03-25 NOTE — Progress Notes (Signed)
Subjective:  Patient ID: Brandon [name], male    DOB: 12-29-1945  Age: 74 y.o. MRN: [id number]  CC:  Chief Complaint  Patient presents with  . Follow-up      HPI  HPI  Brandon [name] is a 74 year old male patient who presents today for follow up regarding on going shortness of breath.  I saw him on 10/6 for an acute visit after having ongoing increased shortness of breath.  He reported that he had Covid back in January 2021.  He has not had the Covid vaccine since having Covid.  He reported he does not want his flu shot at that visit or todays.  Today: He reports he never started using the inhaler I ordered for him at this last visit. See details below.  He reports dyspnea on exertion mostly. He also wants to know why he still is coughing this far out from being sick. He does not understand why COVID changed this for him and his wife.   10/6 He says that he started improving a few weeks back. But he recently has had increased shortness of breath, increasing fatigue, coughing so hard that he feels lightheaded and gets a pounding headache.  He reports he gave his inhaler to his wife who had Covid as well but had more symptoms than him.  She sees a pulmonologist.  He was never referred to a pulmonologist as he only had a slightly lingering cough. She reports the shortness of breath happens a lot with he is trying to do normal activities in his yard or around the house. He does have some mucus production. No blood noted per him. He denies active chest pain, recent sick contacts, or trouble with sinuses On PE he has decreased breath sounds, but otherwise normal.  Chest xray was normal. Labs were unremarkable. 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   Left Knee pain: Off and on for for a while.  He thinks he has arthritis in it.  He does drive a truck and when he has to push in a clutch he actually has to forcefully put his hand on his need to push it down as it just so painful he cannot make himself do it without help.  He denies that it gets stuck or locked in place.  He denies trying anything over-the-counter for it.  Today patient denies signs and symptoms of COVID 19 infection including fever, chills, cough, shortness of breath, and headache. Past Medical, Surgical, Social History, Allergies, and Medications have been Reviewed.   Past Medical History:  Diagnosis Date  . Arthritis   . Blood transfusion    no trouble-51 years ago  . CAD S/P percutaneous coronary angioplasty: DES PCI to mLAD & OM1 04/03/2014   a. Lesion #1: (OM 1 90%) Promus Premier DES 2.25 x 12 mm (2.5 mm)  Lesion #2: (mid LAD 70% ) Promus Premier DES 2.5 x 16 mm  (2.75 mm)   . Cancer (HCC)    Skin  . Chest pain 06/21/2012  . Chronic headaches   . Chronic prostatitis   . COVID-19 06/03/2019  . Diverticulitis   . Fatigue 06/21/2012  .  GERD (gastroesophageal reflux disease)   . Hematuria   . Hernia of other specified sites of abdominal cavity without mention of obstruction or gangrene   . Hypertension   . Nausea vomiting and diarrhea   . Other specified forms of hearing loss   . Pneumonia due to COVID-19 virus 06/04/2019  . PONV (postoperative nausea and vomiting)   . Shortness of breath    with exertion  . SOB (shortness of breath) 06/21/2012  . Unstable angina (HCC) 04/03/2014    Current Meds  Medication Sig  . albuterol (VENTOLIN HFA) 108 (90 Base) MCG/ACT inhaler Inhale 2 puffs into the lungs every 6 (six) hours as needed for wheezing or shortness of breath.  . alfuzosin (UROXATRAL) 10 MG 24 hr tablet Take 10 mg by mouth daily.  Marland Kitchen atorvastatin (LIPITOR) 80 MG tablet TAKE ONE TABLET BY MOUTH DAILY AT 6 PM.  .  butalbital-acetaminophen-caffeine (FIORICET) 50-325-40 MG tablet Take 1 tablet by mouth every 4 (four) hours as needed for headache.  . esomeprazole (NEXIUM) 20 MG capsule Take 1 capsule (20 mg total) by mouth 2 (two) times daily before a meal.  . NITROSTAT 0.4 MG SL tablet PLACE 1 TABLET UNDER TONGUE FOR CHEST PAIN. MAY REPEAT EVERY 5 MIN UPTO 3 DOSES-NO RELIEF,CALL 911. (Patient taking differently: Place 0.4 mg under the tongue every 5 (five) minutes as needed for chest pain. )    ROS:  Review of Systems  Constitutional: Negative.   HENT: Negative.   Eyes: Negative.   Respiratory: Positive for cough and shortness of breath.   Cardiovascular: Negative.   Gastrointestinal: Negative.   Genitourinary: Negative.   Musculoskeletal: Positive for joint pain, myalgias and neck pain.  Skin: Negative.   Neurological: Negative.   Endo/Heme/Allergies: Negative.   Psychiatric/Behavioral: Negative.      Objective:   Today's Vitals: BP 124/70 (BP Location: Left Arm, Patient Position: Sitting, Cuff Size: Normal)   Pulse 68   Ht 5\' 8"  (1.727 m)   Wt 182 lb (82.6 kg)   SpO2 98%   BMI 27.67 kg/m  Vitals with BMI 03/25/2020 03/04/2020 11/18/2019  Height 5\' 8"  5\' 8"  5\' 8"   Weight 182 lbs 179 lbs 1 oz 179 lbs  BMI 27.68 78.93 81.01  Systolic 751 025 852  Diastolic 70 71 78  Pulse 68 70 69     Physical Exam Vitals and nursing note reviewed.  Constitutional:      Appearance: Normal appearance. He is well-developed, well-groomed and overweight.  HENT:     Head: Normocephalic and atraumatic.     Right Ear: External ear normal.     Left Ear: External ear normal.     Mouth/Throat:     Comments: Mask in place  Eyes:     General:        Right eye: No discharge.        Left eye: No discharge.     Conjunctiva/sclera: Conjunctivae normal.  Cardiovascular:     Rate and Rhythm: Normal rate and regular rhythm.     Pulses: Normal pulses.     Heart sounds: Normal heart sounds.  Pulmonary:      Effort: Pulmonary effort is normal.     Breath sounds: Decreased breath sounds present.  Musculoskeletal:        General: Normal range of motion.     Right shoulder: Tenderness present.     Left shoulder: Tenderness present.     Cervical back: Normal range of motion and neck supple.  Comments: Tenderness and tightness across traps and neck  Skin:    General: Skin is warm.  Neurological:     General: No focal deficit present.     Mental Status: He is alert and oriented to person, place, and time.  Psychiatric:        Attention and Perception: Attention normal.        Mood and Affect: Mood normal.        Speech: Speech normal.        Behavior: Behavior normal. Behavior is cooperative.        Thought Content: Thought content normal.        Cognition and Memory: Cognition normal.        Judgment: Judgment normal.     Assessment   1. Primary hypertension   2. Shortness of breath   3. Chronic pain of left knee   4. Acute upper back pain     Tests ordered No orders of the defined types were placed in this encounter.    Plan: Please see assessment and plan per problem list above.   No orders of the defined types were placed in this encounter.   Patient to follow-up in 05/07/2020   Note: This dictation was prepared with Dragon dictation along with smaller phrase technology. Similar sounding words can be transcribed inadequately or may not be corrected upon review. Any transcriptional errors that result from this process are unintentional.      Perlie Mayo, NP

## 2020-03-25 NOTE — Assessment & Plan Note (Signed)
Chronic pain in the left knee.  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  I have encouraged him to possibly look at a job that does not require him to do such a long period of sitting.  In addition we could look at sending him to Ortho for possible injections in the near future.

## 2020-05-01 DIAGNOSIS — Z961 Presence of intraocular lens: Secondary | ICD-10-CM | POA: Diagnosis not present

## 2020-05-07 ENCOUNTER — Encounter: Payer: Self-pay | Admitting: Family Medicine

## 2020-05-07 ENCOUNTER — Other Ambulatory Visit: Payer: Self-pay

## 2020-05-07 ENCOUNTER — Ambulatory Visit (INDEPENDENT_AMBULATORY_CARE_PROVIDER_SITE_OTHER): Payer: Medicare Other | Admitting: Family Medicine

## 2020-05-07 VITALS — BP 120/66 | HR 74 | Temp 97.7°F | Ht 68.0 in | Wt 178.0 lb

## 2020-05-07 DIAGNOSIS — M25562 Pain in left knee: Secondary | ICD-10-CM

## 2020-05-07 DIAGNOSIS — R0789 Other chest pain: Secondary | ICD-10-CM | POA: Diagnosis not present

## 2020-05-07 DIAGNOSIS — R0602 Shortness of breath: Secondary | ICD-10-CM | POA: Diagnosis not present

## 2020-05-07 DIAGNOSIS — G8929 Other chronic pain: Secondary | ICD-10-CM | POA: Diagnosis not present

## 2020-05-07 DIAGNOSIS — M549 Dorsalgia, unspecified: Secondary | ICD-10-CM

## 2020-05-07 NOTE — Progress Notes (Signed)
Subjective:  Patient ID: Brandon [name], male    DOB: 05/03/1946  Age: 74 y.o. MRN: [id number]  CC:  Chief Complaint  Patient presents with  . Back Pain      HPI  HPI [name] a 74 year old male patient who presents today for follow-up on back pain.  Reports that one of his friends gave him Voltaren gel.  When asked how he was taking it he reports he was taking it by squeezing it into his hand not measuring at and applying it to his joints that were giving him trouble 1 or 2 times a week.  He reports that it helped tremendously.  He does feel little bit better in his shoulder and left knee.  He has not tried it on his right knee or his back.  He reports his back on bothers him when he is having when he aggravates it hard for about a week he reports that he did have a fall years ago was diagnosed with bone spurs and fractures of his back for what he remembers.  Given several joint issues and concerns he has been asked if he would like to do a referral to Ortho but he has declined.  He admits to not using his inhaler again as advised at last visits.  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  But he also now reports that he has a rubber bandlike chest tightness around his chest at times if not daily.  He reports he has had it since Covid which she had in January but is never mentioned it in the office over the last several months.  Sometimes he will feel like his heart is racing but not acutely related to his been taking Tylenol nightly thinking that that would help him sleep.  But he only sleeps for 4 hours at most at a time.  Is nothing new for him per him.  He denies having any active chest pain, chest tightness, palpitations, leg swelling, cough or shortness of breath today in the office.  He reports his cough has been present for most of his life.  Most likely reflux related his dad had  the same thing she does get up some sputum but the color of any amount have not changed.  To note he does have an appointment with cardiology coming up on the 20th.  Today patient denies signs and symptoms of COVID 19 infection including fever, chills, cough, shortness of breath, and headache. Past Medical, Surgical, Social History, Allergies, and Medications have been Reviewed.   Past Medical History:  Diagnosis Date  . Arthritis   . Blood transfusion    no trouble-51 years ago  . CAD S/P percutaneous coronary angioplasty: DES PCI to mLAD & OM1 04/03/2014   a. Lesion #1: (OM 1 90%) Promus Premier DES 2.25 x 12 mm (2.5 mm)  Lesion #2: (mid LAD 70% ) Promus Premier DES 2.5 x 16 mm  (2.75 mm)   . Cancer (HCC)    Skin  . Chest pain 06/21/2012  . Chronic headaches   . Chronic prostatitis   . COVID-19 06/03/2019  . Diverticulitis   . Fatigue 06/21/2012  . GERD (gastroesophageal reflux disease)   . Hematuria   . Hernia of other specified sites of abdominal cavity without mention of obstruction or gangrene   . Hypertension   . Nausea  vomiting and diarrhea   . Other specified forms of hearing loss   . Pneumonia due to COVID-19 virus 06/04/2019  . PONV (postoperative nausea and vomiting)   . Shortness of breath    with exertion  . SOB (shortness of breath) 06/21/2012  . Unstable angina (HCC) 04/03/2014    Current Meds  Medication Sig  . albuterol (VENTOLIN HFA) 108 (90 Base) MCG/ACT inhaler Inhale 2 puffs into the lungs every 6 (six) hours as needed for wheezing or shortness of breath.  . alfuzosin (UROXATRAL) 10 MG 24 hr tablet Take 10 mg by mouth daily.  Marland Kitchen atorvastatin (LIPITOR) 80 MG tablet TAKE ONE TABLET BY MOUTH DAILY AT 6 PM.  . butalbital-acetaminophen-caffeine (FIORICET) 50-325-40 MG tablet Take 1 tablet by mouth every 4 (four) hours as needed for headache.  . esomeprazole (NEXIUM) 20 MG capsule Take 1 capsule (20 mg total) by mouth 2 (two) times daily before a meal.  . NITROSTAT  0.4 MG SL tablet PLACE 1 TABLET UNDER TONGUE FOR CHEST PAIN. MAY REPEAT EVERY 5 MIN UPTO 3 DOSES-NO RELIEF,CALL 911. (Patient taking differently: Place 0.4 mg under the tongue every 5 (five) minutes as needed for chest pain.)    ROS:  Review of Systems  Constitutional: Negative.   HENT: Negative.   Eyes: Negative.   Respiratory: Positive for shortness of breath.   Cardiovascular: Negative.        See hpi   Gastrointestinal: Negative.   Genitourinary: Negative.   Musculoskeletal: Positive for back pain, joint pain and neck pain.  Skin: Negative.   Neurological: Negative.   Endo/Heme/Allergies: Negative.   Psychiatric/Behavioral: Negative.      Objective:   Today's Vitals: BP 120/66 (BP Location: Left Arm, Patient Position: Sitting, Cuff Size: Normal)   Pulse 74   Temp 97.7 F (36.5 C) (Temporal)   Ht 5\' 8"  (1.727 m)   Wt 178 lb (80.7 kg)   SpO2 97%   BMI 27.06 kg/m  Vitals with BMI 05/07/2020 03/25/2020 03/04/2020  Height 5\' 8"  5\' 8"  5\' 8"   Weight 178 lbs 182 lbs 179 lbs 1 oz  BMI 27.07 16.38 46.65  Systolic 993 570 177  Diastolic 66 70 71  Pulse 74 68 70     Physical Exam Vitals and nursing note reviewed.  Constitutional:      Appearance: Normal appearance. He is well-developed, well-groomed and overweight.  HENT:     Head: Normocephalic and atraumatic.     Right Ear: External ear normal.     Left Ear: External ear normal.     Mouth/Throat:     Comments: Mask in place  Eyes:     General:        Right eye: No discharge.        Left eye: No discharge.     Conjunctiva/sclera: Conjunctivae normal.  Cardiovascular:     Rate and Rhythm: Normal rate and regular rhythm.     Pulses: Normal pulses.     Heart sounds: Normal heart sounds.  Pulmonary:     Effort: Pulmonary effort is normal.     Breath sounds: Normal breath sounds. Decreased air movement present.  Musculoskeletal:        General: Normal range of motion.     Cervical back: Normal range of motion and  neck supple.     Comments: MAE, ROM intact   Skin:    General: Skin is warm.  Neurological:     General: No focal deficit present.  Mental Status: He is alert and oriented to person, place, and time.  Psychiatric:        Attention and Perception: Attention normal.        Mood and Affect: Mood normal.        Speech: Speech normal.        Behavior: Behavior normal. Behavior is cooperative.        Thought Content: Thought content normal.        Cognition and Memory: Cognition normal.        Judgment: Judgment normal.      Assessment   1. Sensation of chest tightness   2. Acute upper back pain   3. Chronic pain of left knee   4. Shortness of breath     Tests ordered No orders of the defined types were placed in this encounter.    Plan: Please see assessment and plan per problem list above.   No orders of the defined types were placed in this encounter.   Patient to follow-up in July 2022-per pt request   Note: This dictation was prepared with Dragon dictation along with smaller phrase technology. Similar sounding words can be transcribed inadequately or may not be corrected upon review. Any transcriptional errors that result from this process are unintentional.      Perlie Mayo, NP

## 2020-05-07 NOTE — Assessment & Plan Note (Signed)
Reports that one of his friends gave him Voltaren gel and that was very well with it.  He like to continue this.  He reports that he did not know that there was a dosage that he needed to do he was just squeezing some onto his hand and pointed out to his joints.  He declines wanting to go to an orthopedist at this time.  Educated on the proper use of the Voltaren gel.  Patient verbalized understanding.

## 2020-05-07 NOTE — Assessment & Plan Note (Signed)
He reports he still not using inhaler as we had talked about last time. He reports that he thinks that shortness of breath is gotten a little bit better as long as he does not exert himself.  We did do x-rays and labs which were unremarkable back in October.  He does not have to use the inhaler.  Of encouraged him to use especially when he is short of breath or when he wants to do activities to that he can perform activity however I am unsure if he really understands the need behind it.

## 2020-05-07 NOTE — Assessment & Plan Note (Signed)
Reports that he has a rubber band feeling of tightness around his chest.  He reports that he has had this since January when he got Covid.  However this is the 1st time he is mentioned in the office.  He reports he has follow-up with his cardiology this month.  And will mention it to him.  I have encouraged him to make sure he mentions that his physical exam is unremarkable today, he denies having any tightness or chest pain today.

## 2020-05-07 NOTE — Assessment & Plan Note (Signed)
Voltaren gel has been used every.  He reports that is much better.  Was unable to get a brace at the pharmacy.  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  Declines referral to Ortho at this time.  We will continue to refrain from muscle relaxers given age and fall risk.

## 2020-05-07 NOTE — Patient Instructions (Signed)
  I appreciate the opportunity to provide you with care for your health and wellness. Today we discussed: several concerns  Follow up: 4 months (can be with Legrand Como or Dr Posey Pronto- if when I am on maternity leave)  No labs or referrals today  Your appt with Dr Johnsie Cancel is 12/20 please let him know about the tightness you feel at times.  Please call if your shortness of breath, cough, or mucus color changes or worsens.  I am glad your wife is starting to feel better.  I am wishing you both a Merry Christmas and Happy New Year!  Please continue to practice social distancing to keep you, your family, and our community safe.  If you must go out, please wear a mask and practice good handwashing.  It was a pleasure to see you and I look forward to continuing to work together on your health and well-being. Please do not hesitate to call the office if you need care or have questions about your care.  Have a wonderful day. With Gratitude, Cherly Beach, DNP, AGNP-BC

## 2020-05-08 NOTE — Progress Notes (Signed)
Cardiology Office Note:    Date:  05/18/2020   ID:  Brandon [name], Brandon [name] 26-Jul-1945, MRN [id number]  PCP:  Perlie Mayo, NP  Cardiologist:  [name], MD  Referring MD: Perlie Mayo, NP   No chief complaint on file.   History of Present Illness:    Brandon [name] is a 74 y.o. male with a past medical history significant for CAD s/p DES to LAD and OM1 2015, hypertension, carotid artery disease. Had non ischemic myovue 01/23/18.  Carotids 08/23/19 with plaque no stenosis in ICA;s With right subclavian velocity 3.17 m/sec. His EF has been normal Last TTE 01/12/18 65-70% with no significant valve disease   He had COVID in January Complicated by significant GI component with vomiting and diarrhea. 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 He uses a chain saw. Anginal equivalent is dyspnea.  Myovue reviewed from 05/13/20 normal no ischemia EF 67%  Past Medical History:  Diagnosis Date   Arthritis    Blood transfusion    no trouble-51 years ago   CAD S/P percutaneous coronary angioplasty: DES PCI to mLAD & OM1 04/03/2014   a. Lesion #1: (OM 1 90%) Promus Premier DES 2.25 x 12 mm (2.5 mm)  Lesion #2: (mid LAD 70% ) Promus Premier DES 2.5 x 16 mm  (2.75 mm)    Cancer (HCC)    Skin   Chest pain 06/21/2012   Chronic headaches    Chronic prostatitis    COVID-19 06/03/2019   Diverticulitis    Fatigue 06/21/2012   GERD (gastroesophageal reflux disease)    Hematuria    Hernia of other specified sites of abdominal cavity without mention of obstruction or gangrene    Hypertension    Nausea vomiting and diarrhea    Other specified forms of hearing loss    Pneumonia due to COVID-19 virus 06/04/2019   PONV (postoperative nausea and vomiting)    Shortness of breath    with  exertion   SOB (shortness of breath) 06/21/2012   Unstable angina ([place]) 04/03/2014    Past Surgical History:  Procedure Laterality Date   CHOLECYSTECTOMY     aph-15 yrs ago0-jenkins   CORONARY STENT PLACEMENT  04/03/2014   DES  OMI  &  LAD   EYE SURGERY     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":   FRACTURE SURGERY     left arm-25 yrs ago   [place]  04/13/2011   Procedure: HERNIA REPAIR INGUINAL ADULT;  Surgeon: [name];  Location: [facility];  Service: General;  Laterality: Right;   LEFT HEART CATHETERIZATION WITH CORONARY ANGIOGRAM N/A 04/03/2014   Procedure: LEFT HEART CATHETERIZATION WITH CORONARY ANGIOGRAM;  Surgeon: [name], MD;  Location: [facility] CATH LAB;  Service: Cardiovascular;  Laterality: N/A;   PERCUTANEOUS CORONARY STENT INTERVENTION (PCI-S)  04/03/2014   Procedure: PERCUTANEOUS CORONARY STENT INTERVENTION (PCI-S);  Surgeon: [name], MD;  Location: [facility] CATH LAB;  Service: Cardiovascular;;   RECONSTRUCTION MID-FACE     left face    Current Medications: Current Meds  Medication Sig   albuterol (VENTOLIN HFA) 108 (90 Base) MCG/ACT inhaler Inhale 2 puffs into the lungs every 6 (six) hours as needed for wheezing or shortness of breath.   alfuzosin (UROXATRAL)  10 MG 24 hr tablet Take 10 mg by mouth daily.   amLODipine (NORVASC) 10 MG tablet Take 1 tablet (10 mg total) by mouth daily.   atorvastatin (LIPITOR) 80 MG tablet TAKE ONE TABLET BY MOUTH DAILY AT 6 PM.   butalbital-acetaminophen-caffeine (FIORICET) 50-325-40 MG tablet Take 1 tablet by mouth every 4 (four) hours as needed for headache.   esomeprazole (NEXIUM) 20 MG capsule Take 1 capsule (20 mg total) by mouth 2 (two) times daily before a meal.   NITROSTAT 0.4 MG SL tablet PLACE 1 TABLET UNDER TONGUE FOR CHEST PAIN. MAY REPEAT EVERY 5 MIN UPTO 3 DOSES-NO RELIEF,CALL 911. (Patient taking differently: Place 0.4 mg under the tongue every 5 (five) minutes as  needed for chest pain.)     Allergies:   Ciprofloxacin   Social History   Socioeconomic History   Marital status: Married    Spouse name: [name]   Number of children: 4   Years of education: Not on file   Highest education level: 11th grade  Occupational History   Not on file  Tobacco Use   Smoking status: Former Smoker    Packs/day: 2.00    Years: 14.00    Pack years: 28.00    Types: Cigarettes    Quit date: 04/10/1969    Years since quitting: 51.1   Smokeless tobacco: Never Used  Substance and Sexual Activity   Alcohol use: No   Drug use: No   Sexual activity: Not on file  Other Topics Concern   Not on file  Social History Narrative   Lives with [name] (not in good health) married 62 years       4 children, 7 grandchildren, 89 Great grandchildren       Enjoys: fishing      Diet: eat all the food groups, cook at home some    Caffeine: 1 pot a day coffee in morning and 1 cup at night   Water: 2 cups daily      Wears seat belt-car yes, big truck no   Smoke detectors    Does not use phone while driving      Social Determinants of Radio broadcast assistant Strain: Not on file  Food Insecurity: Not on file  Transportation Needs: Not on file  Physical Activity: Not on file  Stress: No Stress Concern Present   Feeling of Stress : Only a little  Social Connections: Moderately Integrated   Frequency of Communication with Friends and Family: Twice a week   Frequency of Social Gatherings with Friends and Family: Twice a week   Attends Religious Services: More than 4 times per year   Active Member of [employer] or Organizations: No   Attends Music therapist: Never   Marital Status: Married     Family History: The patient's family history includes Heart attack in his father and mother; Heart disease in his father and mother; Hypertension in his father and mother. ROS:   Please see the history of present illness.     All other  systems reviewed and are negative.  EKGs/Labs/Other Studies Reviewed:    The following studies were reviewed today:  Stress myoview 01/23/2018  Nuclear stress EF: 68%.  There was no ST segment deviation noted during stress.  Defect 1: There is a small defect of mild severity present in the apex location.  The study is normal.  This is a low risk study.  The left ventricular ejection fraction is hyperdynamic (>65%).  Normal low risk stress nuclear study with mild apical thinning but no ischemia.  Gated ejection fraction 65% with normal wall motion.  Echocardiogram 01/12/2018 Study Conclusions - Left ventricle: The cavity size was normal. Wall thickness was   normal. Systolic function was vigorous. The estimated ejection   fraction was in the range of 65% to 70%. Wall motion was normal;   there were no regional wall motion abnormalities. Doppler   parameters are consistent with abnormal left ventricular   relaxation (grade 1 diastolic dysfunction). - Aorta: Ascending aortic diameter: 38 mm (S). - Ascending aorta: The ascending aorta was mildly dilated. - Mitral valve: There was trivial regurgitation. - Right atrium: The atrium was mildly dilated.  EKG:  EKG is ordered today.  The ekg ordered today demonstrates NSR, 69 bpm with no significant ST/T changes  Recent Labs: 06/06/2019: Magnesium 2.3 11/06/2019: Brain Natriuretic Peptide 16 03/04/2020: ALT 26; BUN 13; Creatinine, Ser 1.12; Hemoglobin 13.3; Platelets 203; Potassium 3.9; Sodium 136   Recent Lipid Panel    Component Value Date/Time   CHOL 118 05/27/2014 0731   TRIG 95 06/03/2019 1538   HDL 33.60 (L) 05/27/2014 0731   CHOLHDL 4 05/27/2014 0731   VLDL 18.4 05/27/2014 0731   LDLCALC 66 05/27/2014 0731    Physical Exam:    VS:  BP 118/76    Pulse 71    Ht 5\' 8"  (1.727 m)    Wt 81.7 kg    SpO2 97%    BMI 27.40 kg/m     No data found.   Wt Readings from Last 3 Encounters:  05/18/20 81.7 kg  05/13/20 80.7 kg   05/07/20 80.7 kg    Affect appropriate Healthy:  appears stated age 58: normal Neck supple with no adenopathy JVP normal no bruits no thyromegaly Lungs clear with no wheezing and good diaphragmatic motion Heart:  S1/S2 no murmur, no rub, gallop or click PMI normal Abdomen: benighn, BS positve, no tenderness, no AAA no bruit.  No HSM or HJR Distal pulses intact with no bruits No edema Neuro non-focal Skin warm and dry No muscular weakness    ASSESSMENT:    No diagnosis found. PLAN:    In order of problems listed above:  CAD with stable angina -S/p stenting of OM and LAD in 2015.  Normal myovue 05/10/20 EF 67% continue medical RX  Hypertension -BP soft nitrates have been d/c   Hyperlipidemia -On Lipitor 80 mg daily labs with primary no recent labs in Epic   PVD:  Carotids with plaque no stenosis right subclavian stenosis take BP's in left arm Duplex 08/23/19 peak velocity 3.2 m/sec with bi-directional vertebral flow   Covid:  Long termer with chronic cough now that is causing atypical muscular chest pain that is not angina CXR 03/04/20 NAD consider referral to Dr Melvyn Novas pulmonary per primary   F/U in [place] in 6 months  Medication Adjustments/Labs and Tests Ordered: Current medicines are reviewed at length with the patient today.  Concerns regarding medicines are outlined above. Labs and tests ordered and medication changes are outlined in the patient instructions below:  There are no Patient Instructions on file for this visit.   Signed, [name], MD  05/18/2020 8:23 AM    [facility]

## 2020-05-11 ENCOUNTER — Telehealth: Payer: Self-pay

## 2020-05-11 DIAGNOSIS — R079 Chest pain, unspecified: Secondary | ICD-10-CM

## 2020-05-11 DIAGNOSIS — E785 Hyperlipidemia, unspecified: Secondary | ICD-10-CM

## 2020-05-11 DIAGNOSIS — U099 Post covid-19 condition, unspecified: Secondary | ICD-10-CM

## 2020-05-11 DIAGNOSIS — I2510 Atherosclerotic heart disease of native coronary artery without angina pectoris: Secondary | ICD-10-CM

## 2020-05-11 NOTE — Telephone Encounter (Signed)
Called patient to let him know test was being ordered. Placed order will see if [facility] can schedule before his appointment.

## 2020-05-11 NOTE — Telephone Encounter (Signed)
-----   Message from Josue Hector, MD sent at 05/08/2020  9:51 AM EST ----- Try to order lexiscan myovue before visit on 12/20 for CAD and persistent SSCP since January post COVID

## 2020-05-12 ENCOUNTER — Telehealth (HOSPITAL_COMMUNITY): Payer: Self-pay | Admitting: *Deleted

## 2020-05-12 NOTE — Telephone Encounter (Signed)
Patient given detailed instructions per Myocardial Perfusion Study Information Sheet for the test on 05/13/20. Patient notified to arrive 15 minutes early and that it is imperative to arrive on time for appointment to keep from having the test rescheduled.  If you need to cancel or reschedule your appointment, please call the office within 24 hours of your appointment. . Patient verbalized understanding. Kirstie Peri

## 2020-05-13 ENCOUNTER — Other Ambulatory Visit: Payer: Self-pay

## 2020-05-13 ENCOUNTER — Ambulatory Visit (HOSPITAL_COMMUNITY): Payer: Medicare Other | Attending: Cardiovascular Disease

## 2020-05-13 DIAGNOSIS — E785 Hyperlipidemia, unspecified: Secondary | ICD-10-CM | POA: Diagnosis not present

## 2020-05-13 DIAGNOSIS — R079 Chest pain, unspecified: Secondary | ICD-10-CM | POA: Insufficient documentation

## 2020-05-13 DIAGNOSIS — U099 Post covid-19 condition, unspecified: Secondary | ICD-10-CM | POA: Insufficient documentation

## 2020-05-13 DIAGNOSIS — I2510 Atherosclerotic heart disease of native coronary artery without angina pectoris: Secondary | ICD-10-CM | POA: Diagnosis not present

## 2020-05-13 LAB — MYOCARDIAL PERFUSION IMAGING
LV dias vol: 62 mL (ref 62–150)
LV sys vol: 21 mL
Peak HR: 73 {beats}/min
Rest HR: 68 {beats}/min
SDS: 0
SRS: 1
SSS: 1
TID: 1.1

## 2020-05-13 MED ORDER — TECHNETIUM TC 99M TETROFOSMIN IV KIT
32.5000 | PACK | Freq: Once | INTRAVENOUS | Status: AC | PRN
  Administered 2020-05-13: 32.5 via INTRAVENOUS
  Filled 2020-05-13: qty 33

## 2020-05-13 MED ORDER — REGADENOSON 0.4 MG/5ML IV SOLN
0.4000 mg | Freq: Once | INTRAVENOUS | Status: AC
  Administered 2020-05-13: 0.4 mg via INTRAVENOUS

## 2020-05-13 MED ORDER — TECHNETIUM TC 99M TETROFOSMIN IV KIT
10.9000 | PACK | Freq: Once | INTRAVENOUS | Status: AC | PRN
  Administered 2020-05-13: 10.9 via INTRAVENOUS
  Filled 2020-05-13: qty 11

## 2020-05-18 ENCOUNTER — Encounter: Payer: Self-pay | Admitting: Cardiovascular Disease

## 2020-05-18 ENCOUNTER — Ambulatory Visit (INDEPENDENT_AMBULATORY_CARE_PROVIDER_SITE_OTHER): Payer: Medicare Other | Admitting: Cardiovascular Disease

## 2020-05-18 ENCOUNTER — Other Ambulatory Visit: Payer: Self-pay

## 2020-05-18 VITALS — BP 118/76 | HR 71 | Ht 68.0 in | Wt 180.2 lb

## 2020-05-18 DIAGNOSIS — I739 Peripheral vascular disease, unspecified: Secondary | ICD-10-CM | POA: Diagnosis not present

## 2020-05-18 DIAGNOSIS — I2510 Atherosclerotic heart disease of native coronary artery without angina pectoris: Secondary | ICD-10-CM | POA: Diagnosis not present

## 2020-05-18 DIAGNOSIS — Z9861 Coronary angioplasty status: Secondary | ICD-10-CM | POA: Diagnosis not present

## 2020-05-18 DIAGNOSIS — R0989 Other specified symptoms and signs involving the circulatory and respiratory systems: Secondary | ICD-10-CM

## 2020-05-18 NOTE — Patient Instructions (Signed)
Medication Instructions:  Your physician recommends that you continue on your current medications as directed. Please refer to the Current Medication list given to you today.  *If you need a refill on your cardiac medications before your next appointment, please call your pharmacy*   Lab Work: NONE  If you have labs (blood work) drawn today and your tests are completely normal, you will receive your results only by: MyChart Message (if you have MyChart) OR A paper copy in the mail If you have any lab test that is abnormal or we need to change your treatment, we will call you to review the results.   Testing/Procedures: NONE    Follow-Up: At [facility], you and your health needs are our priority.  As part of our continuing mission to provide you with exceptional heart care, we have created designated Provider Care Teams.  These Care Teams include your primary Cardiologist (physician) and Advanced Practice Providers (APPs -  Physician Assistants and Nurse Practitioners) who all work together to provide you with the care you need, when you need it.  We recommend signing up for the patient portal called "MyChart".  Sign up information is provided on this After Visit Summary.  MyChart is used to connect with patients for Virtual Visits (Telemedicine).  Patients are able to view lab/test results, encounter notes, upcoming appointments, etc.  Non-urgent messages can be sent to your provider as well.   To learn more about what you can do with MyChart, go to [email or web address].    Your next appointment:   6 month(s)  The format for your next appointment:   In Person  Provider:   Peter Nishan, MD   Other Instructions Thank you for choosing [facility]!    

## 2020-05-27 ENCOUNTER — Telehealth (INDEPENDENT_AMBULATORY_CARE_PROVIDER_SITE_OTHER): Payer: Medicare Other | Admitting: Family Medicine

## 2020-05-27 ENCOUNTER — Encounter: Payer: Self-pay | Admitting: Family Medicine

## 2020-05-27 ENCOUNTER — Other Ambulatory Visit: Payer: Self-pay

## 2020-05-27 VITALS — Ht 68.0 in | Wt 180.0 lb

## 2020-05-27 DIAGNOSIS — Z0000 Encounter for general adult medical examination without abnormal findings: Secondary | ICD-10-CM | POA: Diagnosis not present

## 2020-05-27 NOTE — Patient Instructions (Signed)
Brandon [name] , Thank you for taking time to come for your Medicare Wellness Visit. I appreciate your ongoing commitment to your health goals. Please review the following plan we discussed and let me know if I can assist you in the future.   Please continue to practice social distancing to keep you, your family, and our community safe.  If you must go out, please wear a Mask and practice good handwashing.  Screening recommendations/referrals: Recommended yearly ophthalmology/optometry visit for glaucoma screening and checkup Recommended yearly dental visit for hygiene and checkup  Vaccinations: Declined  Preventive Care 15 Years and Older, Male Preventive care refers to lifestyle choices and visits with your health care provider that can promote health and wellness. What does preventive care include?  A yearly physical exam. This is also called an annual well check.  Dental exams once or twice a year.  Routine eye exams. Ask your health care provider how often you should have your eyes checked.  Personal lifestyle choices, including:  Daily care of your teeth and gums.  Regular physical activity.  Eating a healthy diet.  Avoiding tobacco and drug use.  Limiting alcohol use.  Practicing safe sex.  Taking low doses of aspirin every day.  Taking vitamin and mineral supplements as recommended by your health care provider. What happens during an annual well check? The services and screenings done by your health care provider during your annual well check will depend on your age, overall health, lifestyle risk factors, and family history of disease. Counseling  Your health care provider may ask you questions about your:  Alcohol use.  Tobacco use.  Drug use.  Emotional well-being.  Home and relationship well-being.  Sexual activity.  Eating habits.  History of falls.  Memory and ability to understand (cognition).  Work and work Statistician. Screening  You may  have the following tests or measurements:  Height, weight, and BMI.  Blood pressure.  Lipid and cholesterol levels. These may be checked every 5 years, or more frequently if you are over 16 years old.  Skin check.  Lung cancer screening. You may have this screening every year starting at age 82 if you have a 30-pack-year history of smoking and currently smoke or have quit within the past 15 years.  Fecal occult blood test (FOBT) of the stool. You may have this test every year starting at age 44.  Flexible sigmoidoscopy or colonoscopy. You may have a sigmoidoscopy every 5 years or a colonoscopy every 10 years starting at age 63.  Prostate cancer screening. Recommendations will vary depending on your family history and other risks.  Hepatitis C blood test.  Hepatitis B blood test.  Sexually transmitted disease (STD) testing.  Diabetes screening. This is done by checking your blood sugar (glucose) after you have not eaten for a while (fasting). You may have this done every 1-3 years.  Abdominal aortic aneurysm (AAA) screening. You may need this if you are a current or former smoker.  Osteoporosis. You may be screened starting at age 102 if you are at high risk. Talk with your health care provider about your test results, treatment options, and if necessary, the need for more tests. Vaccines  Your health care provider may recommend certain vaccines, such as:  Influenza vaccine. This is recommended every year.  Tetanus, diphtheria, and acellular pertussis (Tdap, Td) vaccine. You may need a Td booster every 10 years.  Zoster vaccine. You may need this after age 34.  Pneumococcal 13-valent conjugate (PCV13)  vaccine. One dose is recommended after age 29.  Pneumococcal polysaccharide (PPSV23) vaccine. One dose is recommended after age 38. Talk to your health care provider about which screenings and vaccines you need and how often you need them. This information is not intended to  replace advice given to you by your health care provider. Make sure you discuss any questions you have with your health care provider. Document Released: 06/12/2015 Document Revised: 02/03/2016 Document Reviewed: 03/17/2015 Elsevier Interactive Patient Education  2017 [employer].  Fall Prevention in the Home Falls can cause injuries. They can happen to people of all ages. There are many things you can do to make your home safe and to help prevent falls. What can I do on the outside of my home?  Regularly fix the edges of walkways and driveways and fix any cracks.  Remove anything that might make you trip as you walk through a door, such as a raised step or threshold.  Trim any bushes or trees on the path to your home.  Use bright outdoor lighting.  Clear any walking paths of anything that might make someone trip, such as rocks or tools.  Regularly check to see if handrails are loose or broken. Make sure that both sides of any steps have handrails.  Any raised decks and porches should have guardrails on the edges.  Have any leaves, snow, or ice cleared regularly.  Use sand or salt on walking paths during winter.  Clean up any spills in your garage right away. This includes oil or grease spills. What can I do in the bathroom?  Use night lights.  Install grab bars by the toilet and in the tub and shower. Do not use towel bars as grab bars.  Use non-skid mats or decals in the tub or shower.  If you need to sit down in the shower, use a plastic, non-slip stool.  Keep the floor dry. Clean up any water that spills on the floor as soon as it happens.  Remove soap buildup in the tub or shower regularly.  Attach bath mats securely with double-sided non-slip rug tape.  Do not have throw rugs and other things on the floor that can make you trip. What can I do in the bedroom?  Use night lights.  Make sure that you have a light by your bed that is easy to reach.  Do not use any  sheets or blankets that are too big for your bed. They should not hang down onto the floor.  Have a firm chair that has side arms. You can use this for support while you get dressed.  Do not have throw rugs and other things on the floor that can make you trip. What can I do in the kitchen?  Clean up any spills right away.  Avoid walking on wet floors.  Keep items that you use a lot in easy-to-reach places.  If you need to reach something above you, use a strong step stool that has a grab bar.  Keep electrical cords out of the way.  Do not use floor polish or wax that makes floors slippery. If you must use wax, use non-skid floor wax.  Do not have throw rugs and other things on the floor that can make you trip. What can I do with my stairs?  Do not leave any items on the stairs.  Make sure that there are handrails on both sides of the stairs and use them. Fix handrails that are  broken or loose. Make sure that handrails are as long as the stairways.  Check any carpeting to make sure that it is firmly attached to the stairs. Fix any carpet that is loose or worn.  Avoid having throw rugs at the top or bottom of the stairs. If you do have throw rugs, attach them to the floor with carpet tape.  Make sure that you have a light switch at the top of the stairs and the bottom of the stairs. If you do not have them, ask someone to add them for you. What else can I do to help prevent falls?  Wear shoes that:  Do not have high heels.  Have rubber bottoms.  Are comfortable and fit you well.  Are closed at the toe. Do not wear sandals.  If you use a stepladder:  Make sure that it is fully opened. Do not climb a closed stepladder.  Make sure that both sides of the stepladder are locked into place.  Ask someone to hold it for you, if possible.  Clearly mark and make sure that you can see:  Any grab bars or handrails.  First and last steps.  Where the edge of each step  is.  Use tools that help you move around (mobility aids) if they are needed. These include:  Canes.  Walkers.  Scooters.  Crutches.  Turn on the lights when you go into a dark area. Replace any light bulbs as soon as they burn out.  Set up your furniture so you have a clear path. Avoid moving your furniture around.  If any of your floors are uneven, fix them.  If there are any pets around you, be aware of where they are.  Review your medicines with your doctor. Some medicines can make you feel dizzy. This can increase your chance of falling. Ask your doctor what other things that you can do to help prevent falls. This information is not intended to replace advice given to you by your health care provider. Make sure you discuss any questions you have with your health care provider. Document Released: 03/12/2009 Document Revised: 10/22/2015 Document Reviewed: 06/20/2014 Elsevier Interactive Patient Education  2017 [employer].

## 2020-05-27 NOTE — Progress Notes (Signed)
Subjective:   Brandon [name] is a 74 y.o. male who presents for Medicare Annual/Subsequent preventive examination.  Participants: Nurse for intake and work up; Patient and Provider for Visit and Wrap up  Method of visit: Telephone  Location of Patient: Home Location of Provider: Office Consent was obtain for visit over the telephone. Services rendered by provider: Visit was performed via telephone/   I verified that I am speaking with the correct person using two identifiers.   Review of Systems     Cardiac Risk Factors include: advanced age (>37men, >25 women);dyslipidemia;hypertension;male gender;sedentary lifestyle;smoking/ tobacco exposure     Objective:    Today's Vitals   05/27/20 1110 05/27/20 1116  Weight: 180 lb (81.6 kg)   Height: 5\' 8"  (1.727 m)   PainSc: 0-No pain 0-No pain   Body mass index is 27.37 kg/m.  Advanced Directives 06/04/2019 06/03/2019 01/05/2018 03/23/2016 04/08/2014 04/03/2014 04/13/2011  Does Patient Have a Medical Advance Directive? No No No No Yes No Patient does not have advance directive;Patient would not like information  Type of Advance Directive - - - - Living will - -  Does patient want to make changes to medical advance directive? - - - - No - Patient declined - -  Copy of [place] in Chart? - - - - No - copy requested - -  Would patient like information on creating a medical advance directive? No - Patient declined No - Patient declined - No - patient declined information - No - patient declined information -  Pre-existing out of facility DNR order (yellow form or pink MOST form) - - - - - - -    Current Medications (verified) Outpatient Encounter Medications as of 05/27/2020  Medication Sig  . albuterol (VENTOLIN HFA) 108 (90 Base) MCG/ACT inhaler Inhale 2 puffs into the lungs every 6 (six) hours as needed for wheezing or shortness of breath.  . alfuzosin (UROXATRAL) 10 MG 24 hr tablet Take 10 mg by mouth daily.  Marland Kitchen  atorvastatin (LIPITOR) 80 MG tablet TAKE ONE TABLET BY MOUTH DAILY AT 6 PM.  . butalbital-acetaminophen-caffeine (FIORICET) 50-325-40 MG tablet Take 1 tablet by mouth every 4 (four) hours as needed for headache.  . esomeprazole (NEXIUM) 20 MG capsule Take 1 capsule (20 mg total) by mouth 2 (two) times daily before a meal.  . NITROSTAT 0.4 MG SL tablet PLACE 1 TABLET UNDER TONGUE FOR CHEST PAIN. MAY REPEAT EVERY 5 MIN UPTO 3 DOSES-NO RELIEF,CALL 911. (Patient taking differently: Place 0.4 mg under the tongue every 5 (five) minutes as needed for chest pain.)  . amLODipine (NORVASC) 10 MG tablet Take 1 tablet (10 mg total) by mouth daily.   No facility-administered encounter medications on file as of 05/27/2020.    Allergies (verified) Ciprofloxacin   History: Past Medical History:  Diagnosis Date  . Arthritis   . Blood transfusion    no trouble-51 years ago  . CAD S/P percutaneous coronary angioplasty: DES PCI to mLAD & OM1 04/03/2014   a. Lesion #1: (OM 1 90%) Promus Premier DES 2.25 x 12 mm (2.5 mm)  Lesion #2: (mid LAD 70% ) Promus Premier DES 2.5 x 16 mm  (2.75 mm)   . Cancer (HCC)    Skin  . Chest pain 06/21/2012  . Chronic headaches   . Chronic prostatitis   . COVID-19 06/03/2019  . Diverticulitis   . Fatigue 06/21/2012  . GERD (gastroesophageal reflux disease)   . Hematuria   .  Hernia of other specified sites of abdominal cavity without mention of obstruction or gangrene   . Hypertension   . Nausea vomiting and diarrhea   . Other specified forms of hearing loss   . Pneumonia due to COVID-19 virus 06/04/2019  . PONV (postoperative nausea and vomiting)   . Shortness of breath    with exertion  . SOB (shortness of breath) 06/21/2012  . Unstable angina ([place]) 04/03/2014   Past Surgical History:  Procedure Laterality Date  . CHOLECYSTECTOMY     aph-15 yrs ago0-jenkins  . CORONARY STENT PLACEMENT  04/03/2014   DES  OMI  &  LAD  . 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 INGUINAL HERNIA REPAIR  04/13/2011   Procedure: HERNIA REPAIR INGUINAL ADULT;  Surgeon: [name];  Location: [facility];  Service: General;  Laterality: Right;  . LEFT HEART CATHETERIZATION WITH CORONARY ANGIOGRAM N/A 04/03/2014   Procedure: LEFT HEART CATHETERIZATION WITH CORONARY ANGIOGRAM;  Surgeon: [name], MD;  Location: [facility] CATH LAB;  Service: Cardiovascular;  Laterality: N/A;  . PERCUTANEOUS CORONARY STENT INTERVENTION (PCI-S)  04/03/2014   Procedure: PERCUTANEOUS CORONARY STENT INTERVENTION (PCI-S);  Surgeon: [name], MD;  Location: [facility] CATH LAB;  Service: Cardiovascular;;  . RECONSTRUCTION MID-FACE     left face   Family History  Problem Relation Age of Onset  . Heart attack Mother   . Heart disease Mother   . Hypertension Mother   . Heart attack Father   . Heart disease Father   . Hypertension Father    Social History   Socioeconomic History  . Marital status: Married    Spouse name: [name]  . Number of children: 4  . Years of education: Not on file  . Highest education level: 11th grade  Occupational History  . Not on file  Tobacco Use  . Smoking status: Former Smoker    Packs/day: 2.00    Years: 14.00    Pack years: 28.00    Types: Cigarettes    Quit date: 04/10/1969    Years since quitting: 51.1  . Smokeless tobacco: Never Used  Substance and Sexual Activity  . Alcohol use: No  . Drug use: No  . Sexual activity: Not on file  Other Topics Concern  . Not on file  Social History Narrative   Lives with [name] (not in good health) married 10 years       4 children, 7 grandchildren, 24 Great grandchildren       Enjoys: fishing      Diet: eat all the food groups, cook at home some    Caffeine: 1 pot a day coffee in morning and 1 cup at night   Water: 2 cups daily      Wears seat belt-car yes, big truck no   Smoke detectors    Does not use phone while driving       Social Determinants of Health   Financial Resource Strain: Low Risk   . Difficulty of Paying Living Expenses: Not very hard  Food Insecurity: No Food Insecurity  . Worried About Charity fundraiser in the Last Year: Never true  . Ran Out of Food in the Last Year: Never true  Transportation Needs: No Transportation Needs  . Lack of Transportation (Medical): No  . Lack of Transportation (Non-Medical): No  Physical Activity: Inactive  .  Days of Exercise per Week: 0 days  . Minutes of Exercise per Session: 0 min  Stress: No Stress Concern Present  . Feeling of Stress : Only a little  Social Connections: Moderately Isolated  . Frequency of Communication with Friends and Family: Twice a week  . Frequency of Social Gatherings with Friends and Family: Twice a week  . Attends Religious Services: Never  . Active Member of Clubs or Organizations: No  . Attends Archivist Meetings: Never  . Marital Status: Married    Tobacco Counseling Counseling given: Not Answered   Clinical Intake:  Pre-visit preparation completed: Yes  Pain : No/denies pain Pain Score: 0-No pain     Nutritional Status: BMI 25 -29 Overweight Diabetes: No  How often do you need to have someone help you when you read instructions, pamphlets, or other written materials from your doctor or pharmacy?: 1 - Never  Diabetic? no  Interpreter Needed?: No      Activities of Daily Living In your present state of health, do you have any difficulty performing the following activities: 05/27/2020 06/04/2019  Hearing? N N  Vision? N N  Difficulty concentrating or making decisions? N N  Walking or climbing stairs? N N  Dressing or bathing? N N  Doing errands, shopping? N N  Preparing Food and eating ? N -  Using the Toilet? N -  In the past six months, have you accidently leaked urine? N -  Do you have problems with loss of bowel control? N -  Managing your Medications? N -  Managing your Finances? N -   Housekeeping or managing your Housekeeping? N -  Some recent data might be hidden    Patient Care Team: Perlie Mayo, NP as PCP - General (Family Medicine) Josue Hector, MD as PCP - Cardiology (Cardiology)  Indicate any recent Medical Services you may have received from other than [facility] providers in the past year (date may be approximate).     Assessment:   This is a routine wellness examination for [facility].  Hearing/Vision screen No exam data present  Dietary issues and exercise activities discussed: Current Exercise Habits: The patient does not participate in regular exercise at present, Exercise limited by: cardiac condition(s);Other - see comments (works Company secretary as a Administrator)  Goals    . Increase physical activity     Try to walk for 20-30 mins daily     . Prevent falls      Depression Screen PHQ 2/9 Scores 05/27/2020 05/07/2020 03/25/2020 03/04/2020 11/06/2019 08/08/2019 04/08/2014  PHQ - 2 Score 0 0 2 2 1  0 0  PHQ- 9 Score - 8 5 9 2  - -    Fall Risk Fall Risk  05/27/2020 05/27/2020 05/07/2020 03/25/2020 03/04/2020  Falls in the past year? 0 0 1 1 1   Comment - - - - -  Number falls in past yr: 0 0 1 1 1   Injury with Fall? 0 0 0 0 0  Risk Factor Category  - - - - -  Comment - - - - -  Risk for fall due to : - - Impaired balance/gait Impaired balance/gait -  Follow up - - Falls evaluation completed Falls evaluation completed -     FALL RISK PREVENTION PERTAINING TO THE HOME:  Any stairs in or around the home? No  If so, are there any without handrails? No  Home free of loose throw rugs in walkways, pet beds, electrical cords, etc? Yes  Adequate lighting in your home to reduce risk of falls? Yes   ASSISTIVE DEVICES UTILIZED TO PREVENT FALLS:  Life alert? No  Use of a cane, walker or w/c? No  Grab bars in the bathroom? No  Shower chair or bench in shower? No  Elevated toilet seat or a handicapped toilet? No   TIMED UP AND GO:  Was the test performed? No  .  Length of time to ambulate 10 feet:  visit done by phone   Cognitive Function:     6CIT Screen 05/27/2020  What Year? 0 points  What month? 0 points  What time? 0 points  Count back from 20 0 points  Months in reverse 0 points  Repeat phrase 0 points  Total Score 0    Immunizations Immunization History  Administered Date(s) Administered  . Influenza, Seasonal, Injecte, Preservative Fre 04/04/2011    TDAP status: Due, Education has been provided regarding the importance of this vaccine. Advised may receive this vaccine at local pharmacy or Health Dept. Aware to provide a copy of the vaccination record if obtained from local pharmacy or Health Dept. Verbalized acceptance and understanding.  Flu Vaccine status: Declined, Education has been provided regarding the importance of this vaccine but patient still declined. Advised may receive this vaccine at local pharmacy or Health Dept. Aware to provide a copy of the vaccination record if obtained from local pharmacy or Health Dept. Verbalized acceptance and understanding.  Pneumococcal vaccine status: Declined,  Education has been provided regarding the importance of this vaccine but patient still declined. Advised may receive this vaccine at local pharmacy or Health Dept. Aware to provide a copy of the vaccination record if obtained from local pharmacy or Health Dept. Verbalized acceptance and understanding.   Covid-19 vaccine status: Declined, Education has been provided regarding the importance of this vaccine but patient still declined. Advised may receive this vaccine at local pharmacy or Health Dept.or vaccine clinic. Aware to provide a copy of the vaccination record if obtained from local pharmacy or Health Dept. Verbalized acceptance and understanding.  Qualifies for Shingles Vaccine? Yes   Zostavax completed No   Shingrix Completed?: No.    Education has been provided regarding the importance of this vaccine. Patient has been  advised to call insurance company to determine out of pocket expense if they have not yet received this vaccine. Advised may also receive vaccine at local pharmacy or Health Dept. Verbalized acceptance and understanding.  Screening Tests Health Maintenance  Topic Date Due  . COVID-19 Vaccine (1) Never done  . INFLUENZA VACCINE  08/27/2020 (Originally 12/29/2019)  . TETANUS/TDAP  03/04/2021 (Originally 02/11/1965)  . Hepatitis C Screening  03/04/2021 (Originally [birth year])  . PNA vac Low Risk Adult (1 of 2 - PCV13) 03/04/2021 (Originally 02/10/2009)    Health Maintenance  Health Maintenance Due  Topic Date Due  . COVID-19 Vaccine (1) Never done    Colorectal cancer screening: No longer required.   Lung Cancer Screening: (Low Dose CT Chest recommended if Age 42-80 years, 30 pack-year currently smoking OR have quit w/in 15years.) does not qualify.   Lung Cancer Screening Referral:   Additional Screening:  Hepatitis C Screening: does not qualify; pt declined  Vision Screening: Recommended annual ophthalmology exams for early detection of glaucoma and other disorders of the eye. Is the patient up to date with their annual eye exam?  Yes  Who is the provider or what is the name of the office in which the patient attends annual  eye exams?  If pt is not established with a provider, would they like to be referred to a provider to establish care? No .   Dental Screening: Recommended annual dental exams for proper oral hygiene  Community Resource Referral / Chronic Care Management: CRR required this visit?  No   CCM required this visit?  No      Plan:      1. Encounter for Medicare annual wellness exam   I have personally reviewed and noted the following in the patient's chart:   . Medical and social history . Use of alcohol, tobacco or illicit drugs  . Current medications and supplements . Functional ability and status . Nutritional status . Physical activity . Advanced  directives . List of other physicians . Hospitalizations, surgeries, and ER visits in previous 12 months . Vitals . Screenings to include cognitive, depression, and falls . Referrals and appointments  In addition, I have reviewed and discussed with patient certain preventive protocols, quality metrics, and best practice recommendations. A written personalized care plan for preventive services as well as general preventive health recommendations were provided to patient.     Everitt Amber, LPN, LPN   [id number]   Nurse Notes: Visit performed while patient driving by telephone.Visit took 25 mins. Provider in the office.

## 2020-07-13 ENCOUNTER — Telehealth: Payer: Self-pay

## 2020-07-13 NOTE — Telephone Encounter (Signed)
Transition Care Management Unsuccessful Follow-up Telephone Call  Date of discharge and from where:  07/10/2020 from [facility]  Attempts:  1st Attempt  Reason for unsuccessful TCM follow-up call:  Left voice message

## 2020-07-14 NOTE — Telephone Encounter (Signed)
Transition Care Management Unsuccessful Follow-up Telephone Call  Date of discharge and from where:  07/10/20 from [facility]  Attempts:  2nd Attempt  Reason for unsuccessful TCM follow-up call:  Left voice message

## 2020-07-15 NOTE — Telephone Encounter (Signed)
Transition Care Management Unsuccessful Follow-up Telephone Call  Date of discharge and from where:  07/10/2020 from [facility]  Attempts:  3rd Attempt  Reason for unsuccessful TCM follow-up call:  Unable to leave message

## 2020-07-17 ENCOUNTER — Ambulatory Visit (INDEPENDENT_AMBULATORY_CARE_PROVIDER_SITE_OTHER): Payer: Medicare Other | Admitting: Nurse Practitioner

## 2020-07-17 ENCOUNTER — Other Ambulatory Visit: Payer: Self-pay

## 2020-07-17 ENCOUNTER — Encounter: Payer: Self-pay | Admitting: Nurse Practitioner

## 2020-07-17 VITALS — BP 136/78 | HR 76 | Temp 98.3°F | Resp 20 | Ht 68.0 in | Wt 169.0 lb

## 2020-07-17 DIAGNOSIS — S52301E Unspecified fracture of shaft of right radius, subsequent encounter for open fracture type I or II with routine healing: Secondary | ICD-10-CM | POA: Diagnosis not present

## 2020-07-17 DIAGNOSIS — I609 Nontraumatic subarachnoid hemorrhage, unspecified: Secondary | ICD-10-CM

## 2020-07-17 DIAGNOSIS — S5291XA Unspecified fracture of right forearm, initial encounter for closed fracture: Secondary | ICD-10-CM | POA: Insufficient documentation

## 2020-07-17 DIAGNOSIS — S2222XD Fracture of body of sternum, subsequent encounter for fracture with routine healing: Secondary | ICD-10-CM | POA: Diagnosis not present

## 2020-07-17 DIAGNOSIS — S2222XA Fracture of body of sternum, initial encounter for closed fracture: Secondary | ICD-10-CM | POA: Insufficient documentation

## 2020-07-17 HISTORY — DX: Fracture of body of sternum, initial encounter for closed fracture: S22.22XA

## 2020-07-17 HISTORY — DX: Unspecified fracture of right forearm, initial encounter for closed fracture: S52.91XA

## 2020-07-17 NOTE — Assessment & Plan Note (Signed)
-  has chest pain with deep inspiration or coughing -we discussed bracing with a pillow to prevent pain
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
## 2020-07-17 NOTE — Assessment & Plan Note (Signed)
-  had surgery at [facility] -daughter present and has been doing all dressing changes -pictures of wounds look like they are healing well without sign of infection; dressings clean today

## 2020-07-17 NOTE — Assessment & Plan Note (Signed)
-  followed by ortho, neurosurgery, and PT with [facility] -has pain at 8/10, but he has not taken much of his pain medication -we discussed taking it as prescribed -if pain persists after using his pain medication, would consider tramadol, but currently tylenol and his occasional narcotic is working

## 2020-07-18 LAB — BASIC METABOLIC PANEL
BUN/Creatinine Ratio: 24 (ref 10–24)
BUN: 23 mg/dL (ref 8–27)
CO2: 18 mmol/L — ABNORMAL LOW (ref 20–29)
Calcium: 9.3 mg/dL (ref 8.6–10.2)
Chloride: 104 mmol/L (ref 96–106)
Creatinine, Ser: 0.94 mg/dL (ref 0.76–1.27)
GFR calc Af Amer: 91 mL/min/{1.73_m2} (ref >59)
GFR calc non Af Amer: 78 mL/min/{1.73_m2} (ref >59)
Glucose: 113 mg/dL — ABNORMAL HIGH (ref 65–99)
Potassium: 4.4 mmol/L (ref 3.5–5.2)
Sodium: 140 mmol/L (ref 134–144)

## 2020-07-18 LAB — CBC WITH DIFFERENTIAL/PLATELET
Basophils Absolute: 0 10*3/uL (ref 0.0–0.2)
Basos: 0 %
EOS (ABSOLUTE): 0.3 10*3/uL (ref 0.0–0.4)
Eos: 2 %
Hematocrit: 40.5 % (ref 37.5–51.0)
Hemoglobin: 13.5 g/dL (ref 13.0–17.7)
Immature Grans (Abs): 0 10*3/uL (ref 0.0–0.1)
Immature Granulocytes: 0 %
Lymphocytes Absolute: 4.1 10*3/uL — ABNORMAL HIGH (ref 0.7–3.1)
Lymphs: 37 %
MCH: 29.5 pg (ref 26.6–33.0)
MCHC: 33.3 g/dL (ref 31.5–35.7)
MCV: 89 fL (ref 79–97)
Monocytes Absolute: 0.7 10*3/uL (ref 0.1–0.9)
Monocytes: 6 %
Neutrophils Absolute: 6 10*3/uL (ref 1.4–7.0)
Neutrophils: 55 %
Platelets: 365 10*3/uL (ref 150–450)
RBC: 4.57 x10E6/uL (ref 4.14–5.80)
RDW: 11.6 % (ref 11.6–15.4)
WBC: 11.1 10*3/uL — ABNORMAL HIGH (ref 3.4–10.8)

## 2020-07-20 NOTE — Progress Notes (Signed)
Labs look good. His WBC was slightly elevated, but this is expected given his recent wounds. If he has any purulent drainage or his wounds develop a foul odor, we should see him in the office, but I suspect he will continue healing well.

## 2020-07-31 ENCOUNTER — Other Ambulatory Visit: Payer: Self-pay | Admitting: Family Medicine

## 2020-07-31 ENCOUNTER — Telehealth: Payer: Self-pay

## 2020-07-31 ENCOUNTER — Other Ambulatory Visit: Payer: Self-pay | Admitting: Nurse Practitioner

## 2020-07-31 MED ORDER — TRAMADOL HCL 50 MG PO TABS: 50.0000 mg | ORAL_TABLET | Freq: Three times a day (TID) | ORAL | 0 refills | Status: AC | PRN
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
## 2020-07-31 NOTE — Progress Notes (Signed)
Was prescribed a week of oxy-APAP on 07/10/20 by physician at [facility].  He is still having pain. Rx. Tramadol.

## 2020-07-31 NOTE — Telephone Encounter (Signed)
I sent in a week of tramadol for him. Looks like [facility] just sent in a muscle relaxer for him.

## 2020-07-31 NOTE — Telephone Encounter (Signed)
Please advise 

## 2020-07-31 NOTE — Telephone Encounter (Signed)
Can you pull up his narc history? I can't pull it in orders only mode?

## 2020-07-31 NOTE — Telephone Encounter (Signed)
I dont have access under your name.

## 2020-08-03 DIAGNOSIS — S065X9A Traumatic subdural hemorrhage with loss of consciousness of unspecified duration, initial encounter: Secondary | ICD-10-CM | POA: Insufficient documentation

## 2020-08-03 DIAGNOSIS — S065XAA Traumatic subdural hemorrhage with loss of consciousness status unknown, initial encounter: Secondary | ICD-10-CM

## 2020-08-03 DIAGNOSIS — I609 Nontraumatic subarachnoid hemorrhage, unspecified: Secondary | ICD-10-CM | POA: Insufficient documentation

## 2020-08-03 HISTORY — DX: Nontraumatic subarachnoid hemorrhage, unspecified: I60.9

## 2020-08-03 HISTORY — DX: Traumatic subdural hemorrhage with loss of consciousness of unspecified duration, initial encounter: S06.5X9A

## 2020-08-03 HISTORY — DX: Traumatic subdural hemorrhage with loss of consciousness status unknown, initial encounter: S06.5XAA

## 2020-08-03 NOTE — Telephone Encounter (Signed)
Pt informed

## 2020-08-10 ENCOUNTER — Encounter: Payer: Self-pay | Admitting: Internal Medicine

## 2020-08-10 ENCOUNTER — Other Ambulatory Visit: Payer: Self-pay

## 2020-08-10 ENCOUNTER — Telehealth (INDEPENDENT_AMBULATORY_CARE_PROVIDER_SITE_OTHER): Payer: Medicare Other | Admitting: Internal Medicine

## 2020-08-10 DIAGNOSIS — J0100 Acute maxillary sinusitis, unspecified: Secondary | ICD-10-CM

## 2020-08-10 MED ORDER — AZELASTINE HCL 0.1 % NA SOLN: 2.0000 | Freq: Two times a day (BID) | NASAL | 2 refills | Status: DC

## 2020-08-10 MED ORDER — AMOXICILLIN-POT CLAVULANATE 875-125 MG PO TABS: 1.0000 | ORAL_TABLET | Freq: Two times a day (BID) | ORAL | 0 refills | Status: DC
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
## 2020-08-10 NOTE — Patient Instructions (Signed)

## 2020-08-25 DIAGNOSIS — M7521 Bicipital tendinitis, right shoulder: Secondary | ICD-10-CM | POA: Insufficient documentation

## 2020-08-25 DIAGNOSIS — S52321D Displaced transverse fracture of shaft of right radius, subsequent encounter for closed fracture with routine healing: Secondary | ICD-10-CM | POA: Insufficient documentation

## 2020-08-25 DIAGNOSIS — G5621 Lesion of ulnar nerve, right upper limb: Secondary | ICD-10-CM | POA: Insufficient documentation

## 2020-09-10 ENCOUNTER — Encounter (HOSPITAL_COMMUNITY): Payer: Self-pay

## 2020-09-10 ENCOUNTER — Other Ambulatory Visit: Payer: Self-pay

## 2020-09-10 ENCOUNTER — Ambulatory Visit (HOSPITAL_COMMUNITY): Payer: Medicare Other | Attending: Orthopedic Surgery

## 2020-09-10 DIAGNOSIS — M25511 Pain in right shoulder: Secondary | ICD-10-CM | POA: Diagnosis present

## 2020-09-10 DIAGNOSIS — R29898 Other symptoms and signs involving the musculoskeletal system: Secondary | ICD-10-CM | POA: Diagnosis present

## 2020-09-10 DIAGNOSIS — M25621 Stiffness of right elbow, not elsewhere classified: Secondary | ICD-10-CM | POA: Diagnosis present

## 2020-09-10 DIAGNOSIS — M25611 Stiffness of right shoulder, not elsewhere classified: Secondary | ICD-10-CM | POA: Diagnosis present
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
## 2020-09-10 NOTE — Patient Instructions (Signed)
1) Seated Row   Sit up straight with elbows by your sides. Pull back with shoulders/elbows, keeping forearms straight, as if pulling back on the reins of a horse. Squeeze shoulder blades together. Repeat _10-15__times, __2-3__sets/day    3) Shoulder Extension    Sit up straight with both arms by your side, draw your arms back behind your waist. Keep your elbows straight. Repeat __10-15__times, __2-3__sets/day.  Theraputty Home Exercise Program  Complete 1-2 times a day.  putty squeeze  Pt. should squeeze putty in hand trying to keep it round by rotating putty after each squeeze. push fingers through putty to palm each time. Complete for ____3__ minutes.   PUTTY KEY GRIP  Hold the putty at the top of your hand. Squeeze the putty between your thumb and the side of your 2nd finger as shown. Complete for ___3_____ minutes.    PUTTY 3 JAW CHUCK  Roll up some putty into a ball then flatten it. Then, firmly squeeze it with your first 3 fingers as shown. Complete for ___3___ minutes.

## 2020-09-11 DIAGNOSIS — S22000D Wedge compression fracture of unspecified thoracic vertebra, subsequent encounter for fracture with routine healing: Secondary | ICD-10-CM | POA: Insufficient documentation

## 2020-09-15 ENCOUNTER — Ambulatory Visit (HOSPITAL_COMMUNITY): Payer: Medicare Other | Admitting: Occupational Therapy

## 2020-09-15 ENCOUNTER — Other Ambulatory Visit: Payer: Self-pay

## 2020-09-15 ENCOUNTER — Encounter (HOSPITAL_COMMUNITY): Payer: Self-pay | Admitting: Occupational Therapy

## 2020-09-15 DIAGNOSIS — M25621 Stiffness of right elbow, not elsewhere classified: Secondary | ICD-10-CM

## 2020-09-15 DIAGNOSIS — M25611 Stiffness of right shoulder, not elsewhere classified: Secondary | ICD-10-CM

## 2020-09-15 DIAGNOSIS — M25511 Pain in right shoulder: Secondary | ICD-10-CM

## 2020-09-15 DIAGNOSIS — R29898 Other symptoms and signs involving the musculoskeletal system: Secondary | ICD-10-CM
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
## 2020-09-17 ENCOUNTER — Ambulatory Visit (HOSPITAL_COMMUNITY): Payer: Medicare Other

## 2020-09-17 ENCOUNTER — Encounter (HOSPITAL_COMMUNITY): Payer: Self-pay

## 2020-09-17 ENCOUNTER — Other Ambulatory Visit: Payer: Self-pay

## 2020-09-17 DIAGNOSIS — M25621 Stiffness of right elbow, not elsewhere classified: Secondary | ICD-10-CM

## 2020-09-17 DIAGNOSIS — M25511 Pain in right shoulder: Secondary | ICD-10-CM

## 2020-09-17 DIAGNOSIS — M25611 Stiffness of right shoulder, not elsewhere classified: Secondary | ICD-10-CM

## 2020-09-17 DIAGNOSIS — R29898 Other symptoms and signs involving the musculoskeletal system: Secondary | ICD-10-CM
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
## 2020-09-22 ENCOUNTER — Other Ambulatory Visit: Payer: Self-pay

## 2020-09-22 ENCOUNTER — Encounter (HOSPITAL_COMMUNITY): Payer: Self-pay

## 2020-09-22 ENCOUNTER — Ambulatory Visit (HOSPITAL_COMMUNITY): Payer: Medicare Other

## 2020-09-22 DIAGNOSIS — M25511 Pain in right shoulder: Secondary | ICD-10-CM

## 2020-09-22 DIAGNOSIS — M25611 Stiffness of right shoulder, not elsewhere classified: Secondary | ICD-10-CM

## 2020-09-22 DIAGNOSIS — M25621 Stiffness of right elbow, not elsewhere classified: Secondary | ICD-10-CM

## 2020-09-22 DIAGNOSIS — R29898 Other symptoms and signs involving the musculoskeletal system: Secondary | ICD-10-CM | POA: Diagnosis not present
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
## 2020-09-24 ENCOUNTER — Ambulatory Visit (HOSPITAL_COMMUNITY): Payer: Medicare Other | Admitting: Occupational Therapy

## 2020-09-24 ENCOUNTER — Encounter (HOSPITAL_COMMUNITY): Payer: Self-pay | Admitting: Occupational Therapy

## 2020-09-24 ENCOUNTER — Other Ambulatory Visit: Payer: Self-pay

## 2020-09-24 DIAGNOSIS — R29898 Other symptoms and signs involving the musculoskeletal system: Secondary | ICD-10-CM

## 2020-09-24 DIAGNOSIS — M25621 Stiffness of right elbow, not elsewhere classified: Secondary | ICD-10-CM
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
## 2020-09-30 ENCOUNTER — Encounter (HOSPITAL_COMMUNITY): Payer: Medicare Other | Admitting: Occupational Therapy

## 2020-09-30 ENCOUNTER — Other Ambulatory Visit: Payer: Self-pay

## 2020-09-30 ENCOUNTER — Ambulatory Visit (HOSPITAL_COMMUNITY): Payer: Medicare Other | Attending: Orthopedic Surgery

## 2020-09-30 ENCOUNTER — Encounter (HOSPITAL_COMMUNITY): Payer: Self-pay

## 2020-09-30 DIAGNOSIS — M546 Pain in thoracic spine: Secondary | ICD-10-CM | POA: Insufficient documentation

## 2020-09-30 DIAGNOSIS — R293 Abnormal posture: Secondary | ICD-10-CM | POA: Insufficient documentation

## 2020-09-30 DIAGNOSIS — M25611 Stiffness of right shoulder, not elsewhere classified: Secondary | ICD-10-CM | POA: Diagnosis present

## 2020-09-30 DIAGNOSIS — R29898 Other symptoms and signs involving the musculoskeletal system: Secondary | ICD-10-CM | POA: Insufficient documentation

## 2020-09-30 DIAGNOSIS — M25511 Pain in right shoulder: Secondary | ICD-10-CM | POA: Diagnosis present

## 2020-09-30 DIAGNOSIS — M25621 Stiffness of right elbow, not elsewhere classified: Secondary | ICD-10-CM | POA: Diagnosis present
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
## 2020-10-02 ENCOUNTER — Ambulatory Visit (HOSPITAL_COMMUNITY): Payer: Medicare Other

## 2020-10-02 ENCOUNTER — Ambulatory Visit (HOSPITAL_COMMUNITY): Payer: Medicare Other | Admitting: Physical Therapy

## 2020-10-02 ENCOUNTER — Other Ambulatory Visit: Payer: Self-pay

## 2020-10-02 ENCOUNTER — Encounter (HOSPITAL_COMMUNITY): Payer: Self-pay | Admitting: Physical Therapy

## 2020-10-02 DIAGNOSIS — M25611 Stiffness of right shoulder, not elsewhere classified: Secondary | ICD-10-CM

## 2020-10-02 DIAGNOSIS — M25621 Stiffness of right elbow, not elsewhere classified: Secondary | ICD-10-CM

## 2020-10-02 DIAGNOSIS — M25511 Pain in right shoulder: Secondary | ICD-10-CM

## 2020-10-02 DIAGNOSIS — M546 Pain in thoracic spine: Secondary | ICD-10-CM

## 2020-10-02 DIAGNOSIS — R29898 Other symptoms and signs involving the musculoskeletal system: Secondary | ICD-10-CM

## 2020-10-02 DIAGNOSIS — R293 Abnormal posture: Secondary | ICD-10-CM
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
## 2020-10-05 ENCOUNTER — Encounter (HOSPITAL_COMMUNITY): Payer: Self-pay
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
## 2020-10-06 ENCOUNTER — Encounter (HOSPITAL_COMMUNITY): Payer: Medicare Other | Admitting: Specialist

## 2020-10-06 ENCOUNTER — Ambulatory Visit (HOSPITAL_COMMUNITY): Payer: Medicare Other

## 2020-10-06 ENCOUNTER — Encounter (HOSPITAL_COMMUNITY): Payer: Self-pay

## 2020-10-06 ENCOUNTER — Ambulatory Visit (HOSPITAL_COMMUNITY): Payer: Medicare Other | Admitting: Physical Therapy

## 2020-10-06 ENCOUNTER — Other Ambulatory Visit: Payer: Self-pay

## 2020-10-06 DIAGNOSIS — R29898 Other symptoms and signs involving the musculoskeletal system: Secondary | ICD-10-CM

## 2020-10-06 DIAGNOSIS — M25611 Stiffness of right shoulder, not elsewhere classified: Secondary | ICD-10-CM

## 2020-10-06 DIAGNOSIS — M546 Pain in thoracic spine: Secondary | ICD-10-CM

## 2020-10-06 DIAGNOSIS — R293 Abnormal posture: Secondary | ICD-10-CM

## 2020-10-06 DIAGNOSIS — M25511 Pain in right shoulder: Secondary | ICD-10-CM

## 2020-10-06 DIAGNOSIS — M25621 Stiffness of right elbow, not elsewhere classified: Secondary | ICD-10-CM
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
## 2020-10-08 ENCOUNTER — Ambulatory Visit (HOSPITAL_COMMUNITY): Payer: Medicare Other

## 2020-10-08 ENCOUNTER — Encounter (HOSPITAL_COMMUNITY): Payer: Medicare Other | Admitting: Specialist

## 2020-10-08 ENCOUNTER — Ambulatory Visit (HOSPITAL_COMMUNITY): Payer: Medicare Other | Admitting: Physical Therapy

## 2020-10-08 ENCOUNTER — Other Ambulatory Visit: Payer: Self-pay

## 2020-10-08 DIAGNOSIS — R293 Abnormal posture: Secondary | ICD-10-CM

## 2020-10-08 DIAGNOSIS — M25611 Stiffness of right shoulder, not elsewhere classified: Secondary | ICD-10-CM | POA: Diagnosis not present

## 2020-10-08 DIAGNOSIS — M25511 Pain in right shoulder: Secondary | ICD-10-CM

## 2020-10-08 DIAGNOSIS — R29898 Other symptoms and signs involving the musculoskeletal system: Secondary | ICD-10-CM

## 2020-10-08 DIAGNOSIS — M25621 Stiffness of right elbow, not elsewhere classified: Secondary | ICD-10-CM

## 2020-10-08 DIAGNOSIS — M546 Pain in thoracic spine: Secondary | ICD-10-CM
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
## 2020-10-08 NOTE — Patient Instructions (Addendum)
WRIST EXTENSION STRETCH - TABLE  Place both hands on a table as shown and gently lean forward until a stretch is felt. Hold for 20-30 seconds. Complete 2 times.    AROM Exercises    Wrist Extension - NO WEIGHT  Start with wrist at edge of table, palm facing down. With wrist slightly off the edge of the table, curl wrist up and back down. Complete 10X.          *Complete exercises __10____ times each, ___1____ times per day*      Strengthening Exercises    2) WRIST FLEXION CURLS - TABLE with 4 lb weight  Hold a small free weight, rest your forearm on a table and bend your wrist up and down with your palm face up as shown.     3) FREE WEIGHT RADIAL/ULNAR DEVIATION - TABLE - 4lb weight  Hold a small free weight, rest your forearm on a table and bend your wrist up and down with your palm facing towards the side as shown.     4) Pronation 4lb weight  Forearm supported on table with wrist in neutral position. Using a weight, roll wrist so that palm faces downward. Hold for 2 seconds and return to starting position.     5) Supination 4lb weight  Forearm supported on table with wrist in neutral position. Using a weight, roll wrist so that palm is now facing upward. Hold for 2 seconds and return to starting position.      *Complete exercises  __10__times each, __1__times per day*

## 2020-10-09 ENCOUNTER — Encounter (HOSPITAL_COMMUNITY): Payer: Self-pay
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
## 2020-10-13 ENCOUNTER — Ambulatory Visit (HOSPITAL_COMMUNITY): Payer: Medicare Other | Admitting: Physical Therapy

## 2020-10-13 ENCOUNTER — Other Ambulatory Visit: Payer: Self-pay

## 2020-10-13 ENCOUNTER — Ambulatory Visit (HOSPITAL_COMMUNITY): Payer: Medicare Other

## 2020-10-13 ENCOUNTER — Encounter (HOSPITAL_COMMUNITY): Payer: Self-pay | Admitting: Physical Therapy

## 2020-10-13 ENCOUNTER — Encounter (HOSPITAL_COMMUNITY): Payer: Self-pay

## 2020-10-13 DIAGNOSIS — M25621 Stiffness of right elbow, not elsewhere classified: Secondary | ICD-10-CM

## 2020-10-13 DIAGNOSIS — R293 Abnormal posture: Secondary | ICD-10-CM

## 2020-10-13 DIAGNOSIS — M25611 Stiffness of right shoulder, not elsewhere classified: Secondary | ICD-10-CM | POA: Diagnosis not present

## 2020-10-13 DIAGNOSIS — M546 Pain in thoracic spine: Secondary | ICD-10-CM

## 2020-10-13 DIAGNOSIS — M25511 Pain in right shoulder: Secondary | ICD-10-CM

## 2020-10-13 DIAGNOSIS — R29898 Other symptoms and signs involving the musculoskeletal system: Secondary | ICD-10-CM
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
## 2020-10-15 ENCOUNTER — Ambulatory Visit (HOSPITAL_COMMUNITY): Payer: Medicare Other

## 2020-10-15 ENCOUNTER — Other Ambulatory Visit: Payer: Self-pay

## 2020-10-15 ENCOUNTER — Ambulatory Visit (HOSPITAL_COMMUNITY): Payer: Medicare Other | Admitting: Physical Therapy

## 2020-10-15 ENCOUNTER — Telehealth: Payer: Self-pay

## 2020-10-15 ENCOUNTER — Encounter (HOSPITAL_COMMUNITY): Payer: Self-pay

## 2020-10-15 DIAGNOSIS — R293 Abnormal posture: Secondary | ICD-10-CM

## 2020-10-15 DIAGNOSIS — M25621 Stiffness of right elbow, not elsewhere classified: Secondary | ICD-10-CM

## 2020-10-15 DIAGNOSIS — M25511 Pain in right shoulder: Secondary | ICD-10-CM

## 2020-10-15 DIAGNOSIS — M546 Pain in thoracic spine: Secondary | ICD-10-CM

## 2020-10-15 DIAGNOSIS — R29898 Other symptoms and signs involving the musculoskeletal system: Secondary | ICD-10-CM

## 2020-10-15 DIAGNOSIS — M25611 Stiffness of right shoulder, not elsewhere classified: Secondary | ICD-10-CM | POA: Diagnosis not present

## 2020-10-15 NOTE — Telephone Encounter (Signed)
We can do toxassure in office but please make sure before hand that this doesn't need to be ordered by his job as sometimes they are specific as to what they are looking for

## 2020-10-15 NOTE — Telephone Encounter (Signed)
We can perform ToxAssure in the office. Usually, employers arrange those tests because it is comprehensive and sometimes, job specific. So, please make sure with him before we do it in the office.

## 2020-10-15 NOTE — Telephone Encounter (Signed)
Called and advised pt that we could complete the drug test, however MOST employers will send out to there on Lab for Testing

## 2020-10-15 NOTE — Telephone Encounter (Signed)
Can the pt come in to do a drug test , so he can go back to work
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
## 2020-10-20 ENCOUNTER — Ambulatory Visit (HOSPITAL_COMMUNITY): Payer: Medicare Other

## 2020-10-20 ENCOUNTER — Encounter (HOSPITAL_COMMUNITY): Payer: Self-pay

## 2020-10-20 ENCOUNTER — Other Ambulatory Visit: Payer: Self-pay

## 2020-10-20 DIAGNOSIS — R29898 Other symptoms and signs involving the musculoskeletal system: Secondary | ICD-10-CM

## 2020-10-20 DIAGNOSIS — M25611 Stiffness of right shoulder, not elsewhere classified: Secondary | ICD-10-CM

## 2020-10-20 DIAGNOSIS — M546 Pain in thoracic spine: Secondary | ICD-10-CM

## 2020-10-20 DIAGNOSIS — R293 Abnormal posture: Secondary | ICD-10-CM

## 2020-10-20 DIAGNOSIS — M25621 Stiffness of right elbow, not elsewhere classified: Secondary | ICD-10-CM
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
## 2020-10-22 ENCOUNTER — Ambulatory Visit (HOSPITAL_COMMUNITY): Payer: Medicare Other | Admitting: Physical Therapy

## 2020-10-22 ENCOUNTER — Encounter (HOSPITAL_COMMUNITY): Payer: Medicare Other | Admitting: Occupational Therapy

## 2020-10-22 ENCOUNTER — Ambulatory Visit (HOSPITAL_COMMUNITY): Payer: Medicare Other

## 2020-10-22 ENCOUNTER — Other Ambulatory Visit: Payer: Self-pay

## 2020-10-22 ENCOUNTER — Encounter (HOSPITAL_COMMUNITY): Payer: Self-pay

## 2020-10-22 DIAGNOSIS — M546 Pain in thoracic spine: Secondary | ICD-10-CM

## 2020-10-22 DIAGNOSIS — M25611 Stiffness of right shoulder, not elsewhere classified: Secondary | ICD-10-CM

## 2020-10-22 DIAGNOSIS — R29898 Other symptoms and signs involving the musculoskeletal system: Secondary | ICD-10-CM

## 2020-10-22 DIAGNOSIS — R293 Abnormal posture: Secondary | ICD-10-CM

## 2020-10-22 DIAGNOSIS — M25511 Pain in right shoulder: Secondary | ICD-10-CM

## 2020-10-22 DIAGNOSIS — M25621 Stiffness of right elbow, not elsewhere classified: Secondary | ICD-10-CM

## 2020-10-22 NOTE — Patient Instructions (Signed)
Avoid any repetitive wrist movements especially wrist extension.  Pick up items with your palm up. Never palm down.      ICE MASSAGE TO LATERAL EPICONDYLE - COMMON WRIST EXTENSOR TENDON - TENNIS ELBOW  Place direct ice from an ice massage cup to the lateral epicondyle of the elbow as shown (the wrist extensor tendon area). Move the ice in a circular motion 3-5 minutes (no more). Use towels to catch the water drippings. This is commonly the area of inflammation as describe with a Tennis Elbow injury.   You should feel 4 stages of sensations starting with... 1. Uncomfortable sensation of cold, then 2. Stinging, then 3. Burning or aching feeling, then 4. Numbness    Wrist Flexion Stretch   With arm of affected side extended in front of body with palm facing down, let affected wrist drop downward. Using hand on unaffected side, apply gentle downward push on affected hand, stretching the affected wrist downward. Hold this position for 3-5 seconds then relax wrist.    * Modify this stretch by bending you elbow at your side. When able to progress this stretch then complete it as pictured above.
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
## 2020-11-02 ENCOUNTER — Other Ambulatory Visit: Payer: Self-pay | Admitting: Family Medicine

## 2020-11-02 ENCOUNTER — Ambulatory Visit (HOSPITAL_COMMUNITY): Payer: Medicare Other | Admitting: Physical Therapy

## 2020-11-05 ENCOUNTER — Ambulatory Visit (HOSPITAL_COMMUNITY): Payer: Medicare Other | Attending: Orthopedic Surgery | Admitting: Physical Therapy

## 2020-11-05 ENCOUNTER — Other Ambulatory Visit: Payer: Self-pay

## 2020-11-05 DIAGNOSIS — M546 Pain in thoracic spine: Secondary | ICD-10-CM | POA: Diagnosis present

## 2020-11-05 DIAGNOSIS — R293 Abnormal posture: Secondary | ICD-10-CM
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
## 2020-11-10 ENCOUNTER — Other Ambulatory Visit: Payer: Self-pay

## 2020-11-10 ENCOUNTER — Ambulatory Visit (HOSPITAL_COMMUNITY): Payer: Medicare Other | Admitting: Physical Therapy

## 2020-11-10 DIAGNOSIS — M546 Pain in thoracic spine: Secondary | ICD-10-CM

## 2020-11-10 DIAGNOSIS — R293 Abnormal posture: Secondary | ICD-10-CM | POA: Diagnosis not present
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
## 2020-11-12 ENCOUNTER — Other Ambulatory Visit: Payer: Self-pay

## 2020-11-12 ENCOUNTER — Ambulatory Visit (HOSPITAL_COMMUNITY): Payer: Medicare Other | Admitting: Physical Therapy

## 2020-11-12 VITALS — BP 122/81 | HR 63 | Temp 97.9°F | Resp 17

## 2020-11-12 DIAGNOSIS — R293 Abnormal posture: Secondary | ICD-10-CM

## 2020-11-12 DIAGNOSIS — M546 Pain in thoracic spine: Secondary | ICD-10-CM
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
## 2020-11-17 ENCOUNTER — Ambulatory Visit (HOSPITAL_COMMUNITY): Payer: Medicare Other | Admitting: Physical Therapy

## 2020-11-18 ENCOUNTER — Ambulatory Visit (INDEPENDENT_AMBULATORY_CARE_PROVIDER_SITE_OTHER): Payer: Medicare Other | Admitting: Cardiovascular Disease

## 2020-11-18 ENCOUNTER — Other Ambulatory Visit: Payer: Self-pay

## 2020-11-18 ENCOUNTER — Encounter: Payer: Self-pay | Admitting: Cardiovascular Disease

## 2020-11-18 VITALS — BP 98/60 | HR 72 | Ht 68.0 in | Wt 171.8 lb

## 2020-11-18 DIAGNOSIS — I10 Essential (primary) hypertension: Secondary | ICD-10-CM

## 2020-11-18 DIAGNOSIS — E782 Mixed hyperlipidemia: Secondary | ICD-10-CM

## 2020-11-18 DIAGNOSIS — I771 Stricture of artery: Secondary | ICD-10-CM

## 2020-11-18 DIAGNOSIS — I2510 Atherosclerotic heart disease of native coronary artery without angina pectoris: Secondary | ICD-10-CM | POA: Diagnosis not present

## 2020-11-18 NOTE — Patient Instructions (Signed)
Medication Instructions:  Your physician recommends that you continue on your current medications as directed. Please refer to the Current Medication list given to you today.  *If you need a refill on your cardiac medications before your next appointment, please call your pharmacy*   Lab Work: NONE  If you have labs (blood work) drawn today and your tests are completely normal, you will receive your results only by: MyChart Message (if you have MyChart) OR A paper copy in the mail If you have any lab test that is abnormal or we need to change your treatment, we will call you to review the results.   Testing/Procedures: NONE    Follow-Up: At [facility], you and your health needs are our priority.  As part of our continuing mission to provide you with exceptional heart care, we have created designated Provider Care Teams.  These Care Teams include your primary Cardiologist (physician) and Advanced Practice Providers (APPs -  Physician Assistants and Nurse Practitioners) who all work together to provide you with the care you need, when you need it.  We recommend signing up for the patient portal called "MyChart".  Sign up information is provided on this After Visit Summary.  MyChart is used to connect with patients for Virtual Visits (Telemedicine).  Patients are able to view lab/test results, encounter notes, upcoming appointments, etc.  Non-urgent messages can be sent to your provider as well.   To learn more about what you can do with MyChart, go to [email or web address].    Your next appointment:   6 month(s)  The format for your next appointment:   In Person  Provider:   Peter Nishan, MD   Other Instructions Thank you for choosing [facility]!    

## 2020-11-18 NOTE — Addendum Note (Signed)
Addended by: Christella Scheuermann C on: 11/18/2020 11:05 AM   Modules accepted: Orders

## 2020-11-30 ENCOUNTER — Other Ambulatory Visit: Payer: Self-pay | Admitting: Cardiovascular Disease

## 2020-12-03 NOTE — Telephone Encounter (Signed)
This is a [facility] pt.  °

## 2020-12-08 ENCOUNTER — Ambulatory Visit: Payer: Medicare Other | Admitting: Family Medicine

## 2021-01-05 ENCOUNTER — Ambulatory Visit (INDEPENDENT_AMBULATORY_CARE_PROVIDER_SITE_OTHER): Payer: Medicare Other | Admitting: Nurse Practitioner

## 2021-01-05 ENCOUNTER — Ambulatory Visit: Payer: Medicare Other | Admitting: Family Medicine

## 2021-01-05 ENCOUNTER — Encounter: Payer: Self-pay | Admitting: Nurse Practitioner

## 2021-01-05 ENCOUNTER — Ambulatory Visit: Payer: Medicare Other | Admitting: Internal Medicine

## 2021-01-05 ENCOUNTER — Other Ambulatory Visit: Payer: Self-pay

## 2021-01-05 VITALS — BP 98/64 | HR 69 | Temp 97.3°F | Ht 68.0 in | Wt 171.0 lb

## 2021-01-05 DIAGNOSIS — R351 Nocturia: Secondary | ICD-10-CM

## 2021-01-05 DIAGNOSIS — K219 Gastro-esophageal reflux disease without esophagitis: Secondary | ICD-10-CM | POA: Diagnosis not present

## 2021-01-05 DIAGNOSIS — E785 Hyperlipidemia, unspecified: Secondary | ICD-10-CM

## 2021-01-05 DIAGNOSIS — I10 Essential (primary) hypertension: Secondary | ICD-10-CM | POA: Diagnosis not present

## 2021-01-05 NOTE — Assessment & Plan Note (Signed)
BP Readings from Last 3 Encounters:  01/05/21 98/64  11/18/20 98/60  11/23/20 122/81   -well controlled -no med changes today

## 2021-01-05 NOTE — Assessment & Plan Note (Signed)
-  checking labs today 

## 2021-01-05 NOTE — Assessment & Plan Note (Signed)
-  takes nexium BID  -doing well with this

## 2021-01-05 NOTE — Patient Instructions (Signed)
Please have fasting labs drawn today.  We will meet up again in 6 months.  Please have fasting labs drawn 2-3 days prior to your appointment so we can discuss the results during your office visit.

## 2021-01-05 NOTE — Assessment & Plan Note (Signed)
-  followed by urology, Dr. Louis Meckel -requesting records; epic has record of a visit occurring, but no notes
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
## 2021-02-02 ENCOUNTER — Other Ambulatory Visit: Payer: Self-pay | Admitting: Nurse Practitioner

## 2021-03-11 ENCOUNTER — Encounter: Payer: Self-pay | Admitting: Internal Medicine

## 2021-03-11 ENCOUNTER — Encounter (INDEPENDENT_AMBULATORY_CARE_PROVIDER_SITE_OTHER): Payer: Self-pay

## 2021-03-11 ENCOUNTER — Other Ambulatory Visit: Payer: Self-pay

## 2021-03-11 ENCOUNTER — Ambulatory Visit (INDEPENDENT_AMBULATORY_CARE_PROVIDER_SITE_OTHER): Payer: Medicare Other | Admitting: Internal Medicine

## 2021-03-11 VITALS — BP 116/64 | HR 71 | Temp 98.1°F | Resp 18 | Ht 68.0 in | Wt 172.1 lb

## 2021-03-11 DIAGNOSIS — Z8781 Personal history of (healed) traumatic fracture: Secondary | ICD-10-CM | POA: Diagnosis not present

## 2021-03-11 DIAGNOSIS — Z9889 Other specified postprocedural states: Secondary | ICD-10-CM

## 2021-03-11 DIAGNOSIS — M7521 Bicipital tendinitis, right shoulder: Secondary | ICD-10-CM | POA: Diagnosis not present

## 2021-03-11 MED ORDER — NAPROXEN SODIUM 550 MG PO TABS: 550.0000 mg | ORAL_TABLET | Freq: Two times a day (BID) | ORAL | 0 refills | Status: DC
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
## 2021-03-11 NOTE — Patient Instructions (Signed)
Please take Naproxen for pain for now.  Please avoid heavy exertional activity with right arm.  You are being referred to Orthopedic surgeon for right arm pain.
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
## 2021-03-29 ENCOUNTER — Other Ambulatory Visit: Payer: Self-pay | Admitting: *Deleted

## 2021-03-29 MED ORDER — ATORVASTATIN CALCIUM 80 MG PO TABS: 80.0000 mg | ORAL_TABLET | Freq: Every day | ORAL | 3 refills | Status: DC

## 2021-04-09 ENCOUNTER — Encounter: Payer: Self-pay | Admitting: Internal Medicine

## 2021-04-09 ENCOUNTER — Other Ambulatory Visit: Payer: Self-pay

## 2021-04-09 ENCOUNTER — Ambulatory Visit (INDEPENDENT_AMBULATORY_CARE_PROVIDER_SITE_OTHER): Payer: Medicare Other | Admitting: Internal Medicine

## 2021-04-09 DIAGNOSIS — J069 Acute upper respiratory infection, unspecified: Secondary | ICD-10-CM | POA: Diagnosis not present

## 2021-04-09 MED ORDER — SALINE SPRAY 0.65 % NA SOLN: 1.0000 | NASAL | 0 refills | Status: DC | PRN

## 2021-04-09 MED ORDER — AZITHROMYCIN 250 MG PO TABS: ORAL_TABLET | ORAL | 0 refills | Status: AC
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
## 2021-04-21 ENCOUNTER — Ambulatory Visit: Payer: Medicare Other | Admitting: Family Medicine

## 2021-05-10 ENCOUNTER — Other Ambulatory Visit: Payer: Self-pay | Admitting: Nurse Practitioner
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
## 2021-05-26 ENCOUNTER — Other Ambulatory Visit: Payer: Self-pay

## 2021-05-26 ENCOUNTER — Ambulatory Visit (INDEPENDENT_AMBULATORY_CARE_PROVIDER_SITE_OTHER): Payer: Medicare Other | Admitting: Cardiovascular Disease

## 2021-05-26 ENCOUNTER — Encounter: Payer: Self-pay | Admitting: Cardiovascular Disease

## 2021-05-26 VITALS — BP 100/58 | HR 66 | Ht 68.0 in | Wt 173.0 lb

## 2021-05-26 DIAGNOSIS — R0609 Other forms of dyspnea: Secondary | ICD-10-CM

## 2021-05-26 DIAGNOSIS — I771 Stricture of artery: Secondary | ICD-10-CM

## 2021-05-26 DIAGNOSIS — U099 Post covid-19 condition, unspecified: Secondary | ICD-10-CM | POA: Diagnosis not present

## 2021-05-26 DIAGNOSIS — I2510 Atherosclerotic heart disease of native coronary artery without angina pectoris: Secondary | ICD-10-CM

## 2021-05-26 DIAGNOSIS — E782 Mixed hyperlipidemia: Secondary | ICD-10-CM | POA: Diagnosis not present

## 2021-05-26 DIAGNOSIS — I10 Essential (primary) hypertension: Secondary | ICD-10-CM

## 2021-05-26 NOTE — Patient Instructions (Addendum)
Medication Instructions:  Your physician recommends that you continue on your current medications as directed. Please refer to the Current Medication list given to you today.  *If you need a refill on your cardiac medications before your next appointment, please call your pharmacy*   Lab Work: None If you have labs (blood work) drawn today and your tests are completely normal, you will receive your results only by: [place] (if you have MyChart) OR A paper copy in the mail If you have any lab test that is abnormal or we need to change your treatment, we will call you to review the results.   Testing/Procedures: Your physician has requested that you have an echocardiogram. Echocardiography is a painless test that uses sound waves to create images of your heart. It provides your doctor with information about the size and shape of your heart and how well your hearts chambers and valves are working. This procedure takes approximately one hour. There are no restrictions for this procedure.    Follow-Up: At [facility], you and your health needs are our priority.  As part of our continuing mission to provide you with exceptional heart care, we have created designated Provider Care Teams.  These Care Teams include your primary Cardiologist (physician) and Advanced Practice Providers (APPs -  Physician Assistants and Nurse Practitioners) who all work together to provide you with the care you need, when you need it.  We recommend signing up for the patient portal called "MyChart".  Sign up information is provided on this After Visit Summary.  MyChart is used to connect with patients for Virtual Visits (Telemedicine).  Patients are able to view lab/test results, encounter notes, upcoming appointments, etc.  Non-urgent messages can be sent to your provider as well.   To learn more about what you can do with MyChart, go to [email or web address].    Your next appointment:   1 year(s)  The  format for your next appointment:   In Person  Provider:   [name], MD    Other Instructions

## 2021-05-28 ENCOUNTER — Ambulatory Visit: Payer: Medicare Other

## 2021-06-01 ENCOUNTER — Ambulatory Visit (INDEPENDENT_AMBULATORY_CARE_PROVIDER_SITE_OTHER): Payer: Medicare Other

## 2021-06-01 ENCOUNTER — Other Ambulatory Visit: Payer: Self-pay

## 2021-06-01 DIAGNOSIS — Z0000 Encounter for general adult medical examination without abnormal findings: Secondary | ICD-10-CM | POA: Diagnosis not present

## 2021-06-01 DIAGNOSIS — Z1211 Encounter for screening for malignant neoplasm of colon: Secondary | ICD-10-CM
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
## 2021-06-01 NOTE — Patient Instructions (Addendum)
Brandon [name] , Thank you for taking time to come for your Medicare Wellness Visit. I appreciate your ongoing commitment to your health goals. Please review the following plan we discussed and let me know if I can assist you in the future.   These are the goals we discussed:  Goals      Increase physical activity     Try to walk for 20-30 mins daily      Prevent falls        This is a list of the screening recommended for you and due dates:  Health Maintenance  Topic Date Due   COVID-19 Vaccine (1) Never done   Pneumonia Vaccine (1 - PCV) Never done   Hepatitis C Screening: USPSTF Recommendation to screen - Ages 85-79 yo.  Never done   Zoster (Shingles) Vaccine (1 of 2) Never done   Flu Shot  08/27/2021*   Tetanus Vaccine  07/06/2030   HPV Vaccine  Aged Out  *Topic was postponed. The date shown is not the original due date.    Health Maintenance, Male Adopting a healthy lifestyle and getting preventive care are important in promoting health and wellness. Ask your health care provider about: The right schedule for you to have regular tests and exams. Things you can do on your own to prevent diseases and keep yourself healthy. What should I know about diet, weight, and exercise? Eat a healthy diet  Eat a diet that includes plenty of vegetables, fruits, low-fat dairy products, and lean protein. Do not eat a lot of foods that are high in solid fats, added sugars, or sodium. Maintain a healthy weight Body mass index (BMI) is a measurement that can be used to identify possible weight problems. It estimates body fat based on height and weight. Your health care provider can help determine your BMI and help you achieve or maintain a healthy weight. Get regular exercise Get regular exercise. This is one of the most important things you can do for your health. Most adults should: Exercise for at least 150 minutes each week. The exercise should increase your heart rate and make you sweat  (moderate-intensity exercise). Do strengthening exercises at least twice a week. This is in addition to the moderate-intensity exercise. Spend less time sitting. Even light physical activity can be beneficial. Watch cholesterol and blood lipids Have your blood tested for lipids and cholesterol at 78 years of age, then have this test every 5 years. You may need to have your cholesterol levels checked more often if: Your lipid or cholesterol levels are high. You are older than 78 years of age. You are at high risk for heart disease. What should I know about cancer screening? Many types of cancers can be detected early and may often be prevented. Depending on your health history and family history, you may need to have cancer screening at various ages. This may include screening for: Colorectal cancer. Prostate cancer. Skin cancer. Lung cancer. What should I know about heart disease, diabetes, and high blood pressure? Blood pressure and heart disease High blood pressure causes heart disease and increases the risk of stroke. This is more likely to develop in people who have high blood pressure readings or are overweight. Talk with your health care provider about your target blood pressure readings. Have your blood pressure checked: Every 3-5 years if you are 67-45 years of age. Every year if you are 71 years old or older. If you are between the ages of 23 and  52 and are a current or former smoker, ask your health care provider if you should have a one-time screening for abdominal aortic aneurysm (AAA). Diabetes Have regular diabetes screenings. This checks your fasting blood sugar level. Have the screening done: Once every three years after age 25 if you are at a normal weight and have a low risk for diabetes. More often and at a younger age if you are overweight or have a high risk for diabetes. What should I know about preventing infection? Hepatitis B If you have a higher risk for  hepatitis B, you should be screened for this virus. Talk with your health care provider to find out if you are at risk for hepatitis B infection. Hepatitis C Blood testing is recommended for: Everyone born from [birth year] through 1967. Anyone with known risk factors for hepatitis C. Sexually transmitted infections (STIs) You should be screened each year for STIs, including gonorrhea and chlamydia, if: You are sexually active and are younger than 78 years of age. You are older than 78 years of age and your health care provider tells you that you are at risk for this type of infection. Your sexual activity has changed since you were last screened, and you are at increased risk for chlamydia or gonorrhea. Ask your health care provider if you are at risk. Ask your health care provider about whether you are at high risk for HIV. Your health care provider may recommend a prescription medicine to help prevent HIV infection. If you choose to take medicine to prevent HIV, you should first get tested for HIV. You should then be tested every 3 months for as long as you are taking the medicine. Follow these instructions at home: Alcohol use Do not drink alcohol if your health care provider tells you not to drink. If you drink alcohol: Limit how much you have to 0-2 drinks a day. Know how much alcohol is in your drink. In the [place] one drink equals one 12 oz bottle of beer (355 mL), one 5 oz glass of wine (148 mL), or one 1 oz glass of hard liquor (44 mL). Lifestyle Do not use any products that contain nicotine or tobacco. These products include cigarettes, chewing tobacco, and vaping devices, such as e-cigarettes. If you need help quitting, ask your health care provider. Do not use street drugs. Do not share needles. Ask your health care provider for help if you need support or information about quitting drugs. General instructions Schedule regular health, dental, and eye exams. Stay current with your  vaccines. Tell your health care provider if: You often feel depressed. You have ever been abused or do not feel safe at home. Summary Adopting a healthy lifestyle and getting preventive care are important in promoting health and wellness. Follow your health care provider's instructions about healthy diet, exercising, and getting tested or screened for diseases. Follow your health care provider's instructions on monitoring your cholesterol and blood pressure. This information is not intended to replace advice given to you by your health care provider. Make sure you discuss any questions you have with your health care provider. Document Revised: 10/05/2020 Document Reviewed: 10/05/2020 Elsevier Patient Education  [place] Maintenance, Male Adopting a healthy lifestyle and getting preventive care are important in promoting health and wellness. Ask your health care provider about: The right schedule for you to have regular tests and exams. Things you can do on your own to prevent diseases and keep yourself healthy. What should  I know about diet, weight, and exercise? Eat a healthy diet  Eat a diet that includes plenty of vegetables, fruits, low-fat dairy products, and lean protein. Do not eat a lot of foods that are high in solid fats, added sugars, or sodium. Maintain a healthy weight Body mass index (BMI) is a measurement that can be used to identify possible weight problems. It estimates body fat based on height and weight. Your health care provider can help determine your BMI and help you achieve or maintain a healthy weight. Get regular exercise Get regular exercise. This is one of the most important things you can do for your health. Most adults should: Exercise for at least 150 minutes each week. The exercise should increase your heart rate and make you sweat (moderate-intensity exercise). Do strengthening exercises at least twice a week. This is in addition to the  moderate-intensity exercise. Spend less time sitting. Even light physical activity can be beneficial. Watch cholesterol and blood lipids Have your blood tested for lipids and cholesterol at 78 years of age, then have this test every 5 years. You may need to have your cholesterol levels checked more often if: Your lipid or cholesterol levels are high. You are older than 78 years of age. You are at high risk for heart disease. What should I know about cancer screening? Many types of cancers can be detected early and may often be prevented. Depending on your health history and family history, you may need to have cancer screening at various ages. This may include screening for: Colorectal cancer. Prostate cancer. Skin cancer. Lung cancer. What should I know about heart disease, diabetes, and high blood pressure? Blood pressure and heart disease High blood pressure causes heart disease and increases the risk of stroke. This is more likely to develop in people who have high blood pressure readings or are overweight. Talk with your health care provider about your target blood pressure readings. Have your blood pressure checked: Every 3-5 years if you are 37-49 years of age. Every year if you are 7 years old or older. If you are between the ages of 61 and 55 and are a current or former smoker, ask your health care provider if you should have a one-time screening for abdominal aortic aneurysm (AAA). Diabetes Have regular diabetes screenings. This checks your fasting blood sugar level. Have the screening done: Once every three years after age 69 if you are at a normal weight and have a low risk for diabetes. More often and at a younger age if you are overweight or have a high risk for diabetes. What should I know about preventing infection? Hepatitis B If you have a higher risk for hepatitis B, you should be screened for this virus. Talk with your health care provider to find out if you are at  risk for hepatitis B infection. Hepatitis C Blood testing is recommended for: Everyone born from [birth year] through 1967. Anyone with known risk factors for hepatitis C. Sexually transmitted infections (STIs) You should be screened each year for STIs, including gonorrhea and chlamydia, if: You are sexually active and are younger than 78 years of age. You are older than 78 years of age and your health care provider tells you that you are at risk for this type of infection. Your sexual activity has changed since you were last screened, and you are at increased risk for chlamydia or gonorrhea. Ask your health care provider if you are at risk. Ask your health care provider  about whether you are at high risk for HIV. Your health care provider may recommend a prescription medicine to help prevent HIV infection. If you choose to take medicine to prevent HIV, you should first get tested for HIV. You should then be tested every 3 months for as long as you are taking the medicine. Follow these instructions at home: Alcohol use Do not drink alcohol if your health care provider tells you not to drink. If you drink alcohol: Limit how much you have to 0-2 drinks a day. Know how much alcohol is in your drink. In the [place] one drink equals one 12 oz bottle of beer (355 mL), one 5 oz glass of wine (148 mL), or one 1 oz glass of hard liquor (44 mL). Lifestyle Do not use any products that contain nicotine or tobacco. These products include cigarettes, chewing tobacco, and vaping devices, such as e-cigarettes. If you need help quitting, ask your health care provider. Do not use street drugs. Do not share needles. Ask your health care provider for help if you need support or information about quitting drugs. General instructions Schedule regular health, dental, and eye exams. Stay current with your vaccines. Tell your health care provider if: You often feel depressed. You have ever been abused or do not feel safe  at home. Summary Adopting a healthy lifestyle and getting preventive care are important in promoting health and wellness. Follow your health care provider's instructions about healthy diet, exercising, and getting tested or screened for diseases. Follow your health care provider's instructions on monitoring your cholesterol and blood pressure. This information is not intended to replace advice given to you by your health care provider. Make sure you discuss any questions you have with your health care provider. Document Revised: 10/05/2020 Document Reviewed: 10/05/2020 Elsevier Patient Education  [place].

## 2021-06-28 ENCOUNTER — Other Ambulatory Visit: Payer: Self-pay

## 2021-06-28 ENCOUNTER — Ambulatory Visit (HOSPITAL_COMMUNITY)
Admission: RE | Admit: 2021-06-28 | Discharge: 2021-06-28 | Disposition: A | Discharge status: Home / Self Care | Payer: Medicare Other | Source: Ambulatory Visit | Attending: Cardiovascular Disease | Admitting: Cardiovascular Disease

## 2021-06-28 DIAGNOSIS — R0609 Other forms of dyspnea: Secondary | ICD-10-CM | POA: Diagnosis not present

## 2021-06-28 LAB — ECHOCARDIOGRAM COMPLETE
Area-P 1/2: 3.72 cm2
S' Lateral: 2.7 cm

## 2021-06-28 NOTE — Progress Notes (Signed)
*  PRELIMINARY RESULTS* Echocardiogram 2D Echocardiogram has been performed.  Brandon [name] 06/28/2021, 9:21 AM

## 2021-06-29 DIAGNOSIS — Z1211 Encounter for screening for malignant neoplasm of colon: Secondary | ICD-10-CM | POA: Diagnosis not present

## 2021-07-08 ENCOUNTER — Encounter (INDEPENDENT_AMBULATORY_CARE_PROVIDER_SITE_OTHER): Payer: Self-pay

## 2021-07-08 ENCOUNTER — Ambulatory Visit: Payer: Medicare Other | Admitting: Internal Medicine

## 2021-07-08 ENCOUNTER — Other Ambulatory Visit: Payer: Self-pay

## 2021-07-08 LAB — COLOGUARD: COLOGUARD: POSITIVE — AB

## 2021-07-09 ENCOUNTER — Other Ambulatory Visit: Payer: Self-pay | Admitting: *Deleted

## 2021-07-09 DIAGNOSIS — R195 Other fecal abnormalities: Secondary | ICD-10-CM

## 2021-07-13 ENCOUNTER — Encounter: Payer: Self-pay | Admitting: Internal Medicine

## 2021-07-14 ENCOUNTER — Ambulatory Visit (INDEPENDENT_AMBULATORY_CARE_PROVIDER_SITE_OTHER): Payer: Medicare Other | Admitting: Family Medicine

## 2021-07-14 VITALS — BP 128/70 | HR 77 | Temp 98.3°F | Resp 17 | Ht 67.5 in | Wt 171.6 lb

## 2021-07-14 DIAGNOSIS — E785 Hyperlipidemia, unspecified: Secondary | ICD-10-CM

## 2021-07-14 DIAGNOSIS — R195 Other fecal abnormalities: Secondary | ICD-10-CM

## 2021-07-14 DIAGNOSIS — I10 Essential (primary) hypertension: Secondary | ICD-10-CM

## 2021-07-14 DIAGNOSIS — M79605 Pain in left leg: Secondary | ICD-10-CM | POA: Diagnosis not present

## 2021-07-14 DIAGNOSIS — R351 Nocturia: Secondary | ICD-10-CM

## 2021-07-14 DIAGNOSIS — I2510 Atherosclerotic heart disease of native coronary artery without angina pectoris: Secondary | ICD-10-CM | POA: Diagnosis not present

## 2021-07-14 DIAGNOSIS — Z9861 Coronary angioplasty status: Secondary | ICD-10-CM

## 2021-07-14 DIAGNOSIS — M79601 Pain in right arm: Secondary | ICD-10-CM

## 2021-07-14 DIAGNOSIS — K219 Gastro-esophageal reflux disease without esophagitis: Secondary | ICD-10-CM | POA: Diagnosis not present
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
## 2021-07-14 NOTE — Patient Instructions (Addendum)
Fasting lab visit in next 2 weeks.  I will refer you to gastroenterology to discuss Cologuard that was positive and likely colonoscopy.  Based on review of the last note with your orthopedist, they had recommended a nerve pain medication called gabapentin.  I do not see that on the medication list.  I would call their office for follow-up to discuss that medication as well as your continued arm symptoms.  Let me know if you are having difficulty getting into their office.  I am happy to refer you here locally if needed.  For the left hip and leg symptoms, please follow-up to discuss that further in the next week or 2 if you are not able to have that addressed with your orthopedist.  If they are able to see you for your hip and back issues, I will see you in 6 months for med review unless there are other concerns prior to that time.  Thank you for coming in today.  Return to the clinic or go to the nearest emergency room if any of your symptoms worsen or new symptoms occur.

## 2021-07-16 ENCOUNTER — Encounter: Payer: Self-pay | Admitting: Family Medicine

## 2021-07-26 ENCOUNTER — Telehealth: Payer: Self-pay | Admitting: *Deleted

## 2021-07-26 NOTE — Telephone Encounter (Signed)
Patient's wife, listed in [name], called and cancelled lab appt for this upcoming Wednesday.  She states that patient feels he does not need labs drawn. He states that he was concerned with his fall at the last appointment and other concerns were addressed that were not focused on his fall, which was his greatest concern.

## 2021-07-26 NOTE — Telephone Encounter (Signed)
Appreciate the message.  I do recommend he has the lab work as we have not evaluated his electrolytes, or liver tests in the past year which do need to be monitored on his cholesterol and blood pressure medication.  Those were also ordered in August of last year by [facility] but not completed.   In regards to the information about a fall, please call and clarify.  At his last visit I was not aware of a fall but he did state he had had some left hip and left leg pain for the previous few months without recent injury. There was a plan for him to follow-up initially with his previous orthopedist at [facility] due to some continued right arm issues, and to discuss the gabapentin that had been recommended previously.  I see the telephone note that his daughter did call Ortho, and Ortho placed a call to patient's daughter and left message on 07/16/2021 to try to schedule visit. I do not see where he has been evaluated by Ortho recently or that there is an appointment pending.   At his establish care visit with me I did recommend follow-up in the next week or 2 if he was not able to have leg symptoms evaluated with Ortho so that we could discuss the hip, leg pain and possible sciatica symptoms in more detail. I am sorry we did not address that in more detail at his last visit but it had been present for months and mentioned at the end of his visit, I was not aware that was his greatest concern, or that he had sustained a fall.   If he has sustained a recent fall I do recommend he be seen as soon as possible as he may need updated imaging and evaluation.  Let me know how I can help at this time.

## 2021-07-28 ENCOUNTER — Other Ambulatory Visit: Payer: PRIVATE HEALTH INSURANCE

## 2021-07-28 NOTE — Telephone Encounter (Signed)
Called, Pt does not know anything about being scheduled with ortho he is willing to follow up with you for fall concern and do lab work at that visit rather than separate visit, he was not upset at this time notes he still has some leg/hip pain but he is working through this. Soonest available appt 08/13/21 at this time he did take that available opening  ? ?

## 2021-08-11 ENCOUNTER — Telehealth: Payer: Self-pay | Admitting: Cardiovascular Disease

## 2021-08-11 ENCOUNTER — Ambulatory Visit: Payer: PRIVATE HEALTH INSURANCE | Admitting: Gastroenterology

## 2021-08-11 DIAGNOSIS — R0602 Shortness of breath: Secondary | ICD-10-CM

## 2021-08-11 NOTE — Telephone Encounter (Signed)
Pt notified of Dr. Kyla Balzarine note and to have lab and CXR done. Pt will come tomorrow.  ?

## 2021-08-11 NOTE — Telephone Encounter (Signed)
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 Because he was SOB. Pt does not weigh daily but states that his weigh has been the same at each doctors visit. Pt denies chest pain. Doe c/o swelling to lower legs in the daytime, and this has been ongoing for the last 6 months. Please advise.  ?

## 2021-08-11 NOTE — Telephone Encounter (Signed)
Pt c/o Shortness Of Breath: STAT if SOB developed within the last 24 hours or pt is noticeably SOB on the phone ? ?1. Are you currently SOB (can you hear that pt is SOB on the phone)?  ?No  ? ?2. How long have you been experiencing SOB?  ?Past few months  ? ?3. Are you SOB when sitting or when up moving around? When up and moving around ? ?4. Are you currently experiencing any other symptoms?  ?No  ? ?

## 2021-08-12 ENCOUNTER — Other Ambulatory Visit: Payer: Self-pay

## 2021-08-12 ENCOUNTER — Ambulatory Visit (HOSPITAL_COMMUNITY)
Admission: RE | Admit: 2021-08-12 | Discharge: 2021-08-12 | Disposition: A | Discharge status: Home / Self Care | Payer: Medicare Other | Source: Ambulatory Visit | Attending: Cardiovascular Disease | Admitting: Cardiovascular Disease

## 2021-08-12 DIAGNOSIS — R0602 Shortness of breath: Secondary | ICD-10-CM | POA: Insufficient documentation

## 2021-08-13 ENCOUNTER — Ambulatory Visit (INDEPENDENT_AMBULATORY_CARE_PROVIDER_SITE_OTHER): Payer: Medicare Other | Admitting: Family Medicine

## 2021-08-13 VITALS — BP 134/76 | HR 61 | Temp 98.0°F | Resp 17 | Ht 67.5 in | Wt 166.4 lb

## 2021-08-13 DIAGNOSIS — I2510 Atherosclerotic heart disease of native coronary artery without angina pectoris: Secondary | ICD-10-CM | POA: Diagnosis not present

## 2021-08-13 DIAGNOSIS — R42 Dizziness and giddiness: Secondary | ICD-10-CM

## 2021-08-13 DIAGNOSIS — R0609 Other forms of dyspnea: Secondary | ICD-10-CM

## 2021-08-13 DIAGNOSIS — Z9861 Coronary angioplasty status: Secondary | ICD-10-CM | POA: Diagnosis not present

## 2021-08-13 DIAGNOSIS — R051 Acute cough: Secondary | ICD-10-CM | POA: Diagnosis not present

## 2021-08-13 DIAGNOSIS — E785 Hyperlipidemia, unspecified: Secondary | ICD-10-CM | POA: Diagnosis not present

## 2021-08-13 DIAGNOSIS — R0989 Other specified symptoms and signs involving the circulatory and respiratory systems: Secondary | ICD-10-CM | POA: Diagnosis not present

## 2021-08-13 DIAGNOSIS — I10 Essential (primary) hypertension: Secondary | ICD-10-CM | POA: Diagnosis not present

## 2021-08-13 LAB — LIPID PANEL
Cholesterol: 97 mg/dL (ref 0–200)
HDL: 29.4 mg/dL — ABNORMAL LOW (ref >39.00)
LDL Cholesterol: 52 mg/dL (ref 0–99)
NonHDL: 68
Total CHOL/HDL Ratio: 3
Triglycerides: 81 mg/dL (ref 0.0–149.0)
VLDL: 16.2 mg/dL (ref 0.0–40.0)

## 2021-08-13 LAB — CBC
HCT: 42.6 % (ref 39.0–52.0)
Hemoglobin: 14.3 g/dL (ref 13.0–17.0)
MCHC: 33.5 g/dL (ref 30.0–36.0)
MCV: 88.6 fl (ref 78.0–100.0)
Platelets: 245 10*3/uL (ref 150.0–400.0)
RBC: 4.81 Mil/uL (ref 4.22–5.81)
RDW: 12.8 % (ref 11.5–15.5)
WBC: 8.6 10*3/uL (ref 4.0–10.5)

## 2021-08-13 LAB — COMPREHENSIVE METABOLIC PANEL
ALT: 18 U/L (ref 0–53)
AST: 22 U/L (ref 0–37)
Albumin: 4.3 g/dL (ref 3.5–5.2)
Alkaline Phosphatase: 86 U/L (ref 39–117)
BUN: 18 mg/dL (ref 6–23)
CO2: 27 mEq/L (ref 19–32)
Calcium: 9.5 mg/dL (ref 8.4–10.5)
Chloride: 104 mEq/L (ref 96–112)
Creatinine, Ser: 0.96 mg/dL (ref 0.40–1.50)
GFR: 76.24 mL/min (ref >60.00)
Glucose, Bld: 98 mg/dL (ref 70–99)
Potassium: 4.2 mEq/L (ref 3.5–5.1)
Sodium: 139 mEq/L (ref 135–145)
Total Bilirubin: 0.6 mg/dL (ref 0.2–1.2)
Total Protein: 7.4 g/dL (ref 6.0–8.3)

## 2021-08-13 MED ORDER — AMLODIPINE BESYLATE 5 MG PO TABS: 5.0000 mg | ORAL_TABLET | Freq: Every day | ORAL | 1 refills | Status: DC

## 2021-08-13 MED ORDER — BENZONATATE 100 MG PO CAPS: 100.0000 mg | ORAL_CAPSULE | Freq: Three times a day (TID) | ORAL | 0 refills | Status: DC | PRN

## 2021-08-13 MED ORDER — DOXYCYCLINE HYCLATE 100 MG PO TABS: 100.0000 mg | ORAL_TABLET | Freq: Two times a day (BID) | ORAL | 0 refills | Status: DC

## 2021-08-13 NOTE — Patient Instructions (Addendum)
Make sure to eat and drink even when you are sick. Fluids like water are most important. Boost or ensure if needed when appetite is low. Try to get at least 48 ounces water per day.  ?Restart lower dose amlodipine for now. Recheck with home blood pressures in next few weeks.  ? ?Start antibiotic and tessalon perles if needed for cough. If nausea returns,  return to discuss symptoms further and medication options.  I am cautious with putting you on medications that may cause sedation or lightheadedness for now given the prior lightheadedness.  ?If fever, or worsening shortness of breath be seen right away.  ? ?Tylenol for now, with over the counter voltaren gel at bedtime if needed for aches for now.  Please follow up to discuss joint pains and sleep further. ? ?Return to the clinic or go to the nearest emergency room if any of your symptoms worsen or new symptoms occur. ?  ?Fall Prevention in the Home, Adult ?Falls can cause injuries and affect people of all ages. There are many simple things that you can do to make your home safe and to help prevent falls. Ask for help when making these changes, if needed. ?What actions can I take to prevent falls? ?General instructions ?Use good lighting in all rooms. Replace any light bulbs that burn out, turn on lights if it is dark, and use night-lights. ?Place frequently used items in easy-to-reach places. Lower the shelves around your home if necessary. ?Set up furniture so that there are clear paths around it. Avoid moving your furniture around. ?Remove throw rugs and other tripping hazards from the floor. ?Avoid walking on wet floors. ?Fix any uneven floor surfaces. ?Add color or contrast paint or tape to grab bars and handrails in your home. Place contrasting color strips on the first and last steps of staircases. ?When you use a stepladder, make sure that it is completely opened and that the sides and supports are firmly locked. Have someone hold the ladder while you are  using it. Do not climb a closed stepladder. ?Know where your pets are when moving through your home. ?What can I do in the bathroom? ?  ?Keep the floor dry. Immediately clean up any water that is on the floor. ?Remove soap buildup in the tub or shower regularly. ?Use nonskid mats or decals on the floor of the tub or shower. ?Attach bath mats securely with double-sided, nonslip rug tape. ?If you need to sit down while you are in the shower, use a plastic, nonslip stool. ?Install grab bars by the toilet and in the tub and shower. Do not use towel bars as grab bars. ?What can I do in the bedroom? ?Make sure that a bedside light is easy to reach. ?Do not use oversized bedding that reaches the floor. ?Have a firm chair that has side arms to use for getting dressed. ?What can I do in the kitchen? ?Clean up any spills right away. ?If you need to reach for something above you, use a sturdy step stool that has a grab bar. ?Keep electrical cables out of the way. ?Do not use floor polish or wax that makes floors slippery. If you must use wax, make sure that it is non-skid floor wax. ?What can I do with my stairs? ?Do not leave any items on the stairs. ?Make sure that you have a light switch at the top and the bottom of the stairs. Have them installed if you do not have them. ?Make  sure that there are handrails on both sides of the stairs. Fix handrails that are broken or loose. Make sure that handrails are as long as the staircases. ?Install non-slip stair treads on all stairs in your home. ?Avoid having throw rugs at the top or bottom of stairs, or secure the rugs with carpet tape to prevent them from moving. ?Choose a carpet design that does not hide the edge of steps on the stairs. ?Check any carpeting to make sure that it is firmly attached to the stairs. Fix any carpet that is loose or worn. ?What can I do on the outside of my home? ?Use bright outdoor lighting. ?Regularly repair the edges of walkways and driveways and  fix any cracks. ?Remove high doorway thresholds. ?Trim any shrubbery on the main path into your home. ?Regularly check that handrails are securely fastened and in good repair. Both sides of all steps should have handrails. ?Install guardrails along the edges of any raised decks or porches. ?Clear walkways of debris and clutter, including tools and rocks. ?Have leaves, snow, and ice cleared regularly. ?Use sand or salt on walkways during winter months. ?In the garage, clean up any spills right away, including grease or oil spills. ?What other actions can I take? ?Wear closed-toe shoes that fit well and support your feet. Wear shoes that have rubber soles or low heels. ?Use mobility aids as needed, such as canes, walkers, scooters, and crutches. ?Review your medicines with your health care provider. Some medicines can cause dizziness or changes in blood pressure, which increase your risk of falling. ?Talk with your health care provider about other ways that you can decrease your risk of falls. This may include working with a physical therapist or trainer to improve your strength, balance, and endurance. ?Where to find more information ?Centers for Disease Control and Prevention, [employer]: http://www.wolf.info/ ?[employer] on Aging: http://kim-miller.com/ ?Contact a health care provider if: ?You are afraid of falling at home. ?You feel weak, drowsy, or dizzy at home. ?You fall at home. ?Summary ?There are many simple things that you can do to make your home safe and to help prevent falls. ?Ways to make your home safe include removing tripping hazards and installing grab bars in the bathroom. ?Ask for help when making these changes in your home. ?This information is not intended to replace advice given to you by your health care provider. Make sure you discuss any questions you have with your health care provider. ?Document Revised: 12/18/2019 Document Reviewed: 12/18/2019 ?Elsevier Patient Education ? 2022 [employer]. ? ? ?Shortness of Breath, Adult ?Shortness of breath is when a person has trouble breathing or when a person feels like she or he is having trouble breathing in enough air. Shortness of breath could be a sign of a medical problem. ?Follow these instructions at home: ?Pollutants ?Do not use any products that contain nicotine or tobacco. These products include cigarettes, chewing tobacco, and vaping devices, such as e-cigarettes. This also includes cigars and pipes. If you need help quitting, ask your health care provider. ?Avoid things that can irritate your airways, including: ?Smoke. This includes campfire smoke, forest fire smoke, and secondhand smoke from tobacco products. Do not smoke or allow others to smoke in your home. ?Mold. ?Dust. ?Air pollution. ?Chemical fumes. ?Things that can give you an allergic reaction (allergens) if you have allergies. Common allergens include pollen from grasses or trees and animal dander. ?Keep your living space clean and free of mold and dust. ?General instructions ?  Pay attention to any changes in your symptoms. ?Take over-the-counter and prescription medicines only as told by your health care provider. This includes oxygen therapy and inhaled medicines. ?Rest as needed. ?Return to your normal activities as told by your health care provider. Ask your health care provider what activities are safe for you. ?Keep all follow-up visits. This is important. ?Contact a health care provider if: ?Your condition does not improve as soon as expected. ?You have a hard time doing your normal activities, even after you rest. ?You have new symptoms. ?You cannot walk up stairs or exercise the way that you normally do. ?Get help right away if: ?Your shortness of breath gets worse. ?You have shortness of breath when you are resting. ?You feel light-headed or you faint. ?You have a cough that is not controlled with medicines. ?You cough up blood. ?You have pain with breathing. ?You have pain  in your chest, arms, shoulders, or abdomen. ?You have a fever. ?These symptoms may be an emergency. Get help right away. Call 911. ?Do not wait to see if the symptoms will go away. ?Do not drive yourself to the hospit
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
## 2021-08-14 LAB — PRO B NATRIURETIC PEPTIDE: NT-Pro BNP: 59 pg/mL (ref 0–486)

## 2021-08-15 ENCOUNTER — Encounter: Payer: Self-pay | Admitting: Family Medicine

## 2021-08-24 DIAGNOSIS — N3020 Other chronic cystitis without hematuria: Secondary | ICD-10-CM | POA: Diagnosis not present

## 2021-08-24 DIAGNOSIS — R3912 Poor urinary stream: Secondary | ICD-10-CM | POA: Diagnosis not present

## 2021-08-27 ENCOUNTER — Encounter: Payer: Self-pay | Admitting: Family Medicine

## 2021-08-27 ENCOUNTER — Ambulatory Visit (INDEPENDENT_AMBULATORY_CARE_PROVIDER_SITE_OTHER): Payer: Medicare Other | Admitting: Family Medicine

## 2021-08-27 VITALS — BP 128/68 | HR 68 | Temp 98.2°F | Resp 16 | Ht 67.5 in | Wt 174.6 lb

## 2021-08-27 DIAGNOSIS — R0609 Other forms of dyspnea: Secondary | ICD-10-CM

## 2021-08-27 DIAGNOSIS — R42 Dizziness and giddiness: Secondary | ICD-10-CM | POA: Diagnosis not present

## 2021-08-27 DIAGNOSIS — R059 Cough, unspecified: Secondary | ICD-10-CM

## 2021-08-27 DIAGNOSIS — I10 Essential (primary) hypertension: Secondary | ICD-10-CM | POA: Diagnosis not present

## 2021-08-27 NOTE — Patient Instructions (Addendum)
Please call Dr. Johnsie Cancel to discuss the shortness of breath with activity. I am glad to hear that cough has improved. Return to the clinic or go to the nearest emergency room if any of your symptoms worsen or new symptoms occur. ? ?If high blood pressures at home, please schedule nurse visit with your meter to make sure it is reading ok.  ? ?Try to carry a water bottle with you during the day and track your water intake. Try to get 48 ounces or more during the day. If this does not help dizziness, follow up and we can look at other causes or refer you to neurology. Try to get up slowly after sitting for awhile and sit on edge of the bed in the morning for a minute before standing.  ? ?Thanks for coming in today.  ? ?How to Take Your Blood Pressure ?Blood pressure is a measurement of how strongly your blood is pressing against the walls of your arteries. Arteries are blood vessels that carry blood from your heart throughout your body. Your health care provider takes your blood pressure at each office visit. You can also take your own blood pressure at home with a blood pressure monitor. ?You may need to take your own blood pressure to: ?Confirm a diagnosis of high blood pressure (hypertension). ?Monitor your blood pressure over time. ?Make sure your blood pressure medicine is working. ?Supplies needed: ?Blood pressure monitor. ?Dining room chair to sit in. ?Table or desk. ?Small notebook and pencil or pen. ?How to prepare ?To get the most accurate reading, avoid the following for 30 minutes before you check your blood pressure: ?Drinking caffeine. ?Drinking alcohol. ?Eating. ?Smoking. ?Exercising. ?Five minutes before you check your blood pressure: ?Use the bathroom and urinate so that you have an empty bladder. ?Sit quietly in a dining room chair. Do not sit in a soft couch or an armchair. Do not talk. ?How to take your blood pressure ?To check your blood pressure, follow the instructions in the manual that came with  your blood pressure monitor. If you have a digital blood pressure monitor, the instructions may be as follows: ?Sit up straight in a chair. ?Place your feet on the floor. Do not cross your ankles or legs. ?Rest your left arm at the level of your heart on a table or desk or on the arm of a chair. ?Pull up your shirt sleeve. ?Wrap the blood pressure cuff around the upper part of your left arm, 1 inch (2.5 cm) above your elbow. It is best to wrap the cuff around bare skin. ?Fit the cuff snugly around your arm. You should be able to place only one finger between the cuff and your arm. ?Position the cord so that it rests in the bend of your elbow. ?Press the power button. ?Sit quietly while the cuff inflates and deflates. ?Read the digital reading on the monitor screen and write the numbers down (record them) in a notebook. ?Wait 2-3 minutes, then repeat the steps, starting at step 1. ?What does my blood pressure reading mean? ?A blood pressure reading consists of a higher number over a lower number. Ideally, your blood pressure should be below 120/80. The first ("top") number is called the systolic pressure. It is a measure of the pressure in your arteries as your heart beats. The second ("bottom") number is called the diastolic pressure. It is a measure of the pressure in your arteries as the heart relaxes. ?Blood pressure is classified into five stages.  The following are the stages for adults who do not have a short-term serious illness or a chronic condition. Systolic pressure and diastolic pressure are measured in a unit called mm Hg (millimeters of mercury).  ?Normal ?Systolic pressure: below 301. ?Diastolic pressure: below 80. ?Elevated ?Systolic pressure: 601-093. ?Diastolic pressure: below 80. ?Hypertension stage 1 ?Systolic pressure: 235-573. ?Diastolic pressure: 22-02. ?Hypertension stage 2 ?Systolic pressure: 542 or above. ?Diastolic pressure: 90 or above. ?You can have elevated blood pressure or hypertension  even if only the systolic or only the diastolic number in your reading is higher than normal. ?Follow these instructions at home: ?Medicines ?Take over-the-counter and prescription medicines only as told by your health care provider. ?Tell your health care provider if you are having any side effects from blood pressure medicine. ?General instructions ?Check your blood pressure as often as recommended by your health care provider. ?Check your blood pressure at the same time every day. ?Take your monitor to the next appointment with your health care provider to make sure that: ?You are using it correctly. ?It provides accurate readings. ?Understand what your goal blood pressure numbers are. ?Keep all follow-up visits as told by your health care provider. This is important. ?General tips ?Your health care provider can suggest a reliable monitor that will meet your needs. There are several types of home blood pressure monitors. ?Choose a monitor that has an arm cuff. Do not choose a monitor that measures your blood pressure from your wrist or finger. ?Choose a cuff that wraps snugly around your upper arm. You should be able to fit only one finger between your arm and the cuff. ?You can buy a blood pressure monitor at most drugstores or online. ?Where to find more information ?American Heart Association: www.heart.org ?Contact a health care provider if: ?Your blood pressure is consistently high. ?Your blood pressure is suddenly low. ?Get help right away if: ?Your systolic blood pressure is higher than 180. ?Your diastolic blood pressure is higher than 120. ?Summary ?Blood pressure is a measurement of how strongly your blood is pressing against the walls of your arteries. ?A blood pressure reading consists of a higher number over a lower number. Ideally, your blood pressure should be below 120/80. ?Check your blood pressure at the same time every day. ?Avoid caffeine, alcohol, smoking, and exercise for 30 minutes prior to  checking your blood pressure. These agents can affect the accuracy of the blood pressure reading. ?This information is not intended to replace advice given to you by your health care provider. Make sure you discuss any questions you have with your health care provider. ?Document Revised: 03/25/2020 Document Reviewed: 05/10/2019 ?Elsevier Patient Education ? 2022 [place]. ? ? ? ?Dizziness ?Dizziness is a common problem. It is a feeling of unsteadiness or light-headedness. You may feel like you are about to faint. Dizziness can lead to injury if you stumble or fall. Anyone can become dizzy, but dizziness is more common in older adults. This condition can be caused by a number of things, including medicines, dehydration, or illness. ?Follow these instructions at home: ?Eating and drinking ? ?Drink enough fluid to keep your urine pale yellow. This helps to keep you from becoming dehydrated. Try to drink more clear fluids, such as water. ?Do not drink alcohol. ?Limit your caffeine intake if told to do so by your health care provider. Check ingredients and nutrition facts to see if a food or beverage contains caffeine. ?Limit your salt (sodium) intake if told to do so  by your health care provider. Check ingredients and nutrition facts to see if a food or beverage contains sodium. ?Activity ? ?Avoid making quick movements. ?Rise slowly from chairs and steady yourself until you feel okay. ?In the morning, first sit up on the side of the bed. When you feel okay, stand slowly while you hold onto something until you know that your balance is good. ?If you need to stand in one place for a long time, move your legs often. Tighten and relax the muscles in your legs while you are standing. ?Do not drive or use machinery if you feel dizzy. ?Avoid bending down if you feel dizzy. Place items in your home so that they are easy for you to reach without leaning over. ?Lifestyle ?Do not use any products that contain nicotine or  tobacco. These products include cigarettes, chewing tobacco, and vaping devices, such as e-cigarettes. If you need help quitting, ask your health care provider. ?Try to reduce your stress level by using
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
## 2021-09-24 ENCOUNTER — Ambulatory Visit (INDEPENDENT_AMBULATORY_CARE_PROVIDER_SITE_OTHER): Payer: Medicare Other | Admitting: Family Medicine

## 2021-09-24 ENCOUNTER — Encounter: Payer: Self-pay | Admitting: Family Medicine

## 2021-09-24 ENCOUNTER — Other Ambulatory Visit: Payer: Self-pay

## 2021-09-24 VITALS — BP 133/70 | HR 65 | Temp 97.9°F | Resp 18 | Ht 67.5 in | Wt 172.6 lb

## 2021-09-24 DIAGNOSIS — I10 Essential (primary) hypertension: Secondary | ICD-10-CM

## 2021-09-24 DIAGNOSIS — G4700 Insomnia, unspecified: Secondary | ICD-10-CM | POA: Diagnosis not present

## 2021-09-24 DIAGNOSIS — R4589 Other symptoms and signs involving emotional state: Secondary | ICD-10-CM | POA: Diagnosis not present

## 2021-09-24 DIAGNOSIS — R0609 Other forms of dyspnea: Secondary | ICD-10-CM | POA: Diagnosis not present

## 2021-09-24 MED ORDER — TRAZODONE HCL 50 MG PO TABS: 25.0000 mg | ORAL_TABLET | Freq: Every evening | ORAL | 3 refills | Status: DC | PRN
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
## 2021-09-24 NOTE — Patient Instructions (Addendum)
Try to decrease fluid intake 1-2 hours before bedtime (but make to drink plenty of fluid during the day). If you continue to urinate throughout the night, call your urologist to discuss changes.  ? ?Try new sleep med that may help sleep and depression symptoms. See info below. Start with half pill to start.  ? ?Insomnia ?Insomnia is a sleep disorder that makes it difficult to fall asleep or stay asleep. Insomnia can cause fatigue, low energy, difficulty concentrating, mood swings, and poor performance at work or school. ?There are three different ways to classify insomnia: ?Difficulty falling asleep. ?Difficulty staying asleep. ?Waking up too early in the morning. ?Any type of insomnia can be long-term (chronic) or short-term (acute). Both are common. Short-term insomnia usually lasts for 3 months or less. Chronic insomnia occurs at least three times a week for longer than 3 months. ?What are the causes? ?Insomnia may be caused by another condition, situation, or substance, such as: ?Having certain mental health conditions, such as anxiety and depression. ?Using caffeine, alcohol, tobacco, or drugs. ?Having gastrointestinal conditions, such as gastroesophageal reflux disease (GERD). ?Having certain medical conditions. These include: ?Asthma. ?Alzheimer's disease. ?Stroke. ?Chronic pain. ?An overactive thyroid gland (hyperthyroidism). ?Other sleep disorders, such as restless legs syndrome and sleep apnea. ?Menopause. ?Sometimes, the cause of insomnia may not be known. ?What increases the risk? ?Risk factors for insomnia include: ?Gender. Females are affected more often than males. ?Age. Insomnia is more common as people get older. ?Stress and certain medical and mental health conditions. ?Lack of exercise. ?Having an irregular work schedule. This may include working night shifts and traveling between different time zones. ?What are the signs or symptoms? ?If you have insomnia, the main symptom is having trouble  falling asleep or having trouble staying asleep. This may lead to other symptoms, such as: ?Feeling tired or having low energy. ?Feeling nervous about going to sleep. ?Not feeling rested in the morning. ?Having trouble concentrating. ?Feeling irritable, anxious, or depressed. ?How is this diagnosed? ?This condition may be diagnosed based on: ?Your symptoms and medical history. Your health care provider may ask about: ?Your sleep habits. ?Any medical conditions you have. ?Your mental health. ?A physical exam. ?How is this treated? ?Treatment for insomnia depends on the cause. Treatment may focus on treating an underlying condition that is causing the insomnia. Treatment may also include: ?Medicines to help you sleep. ?Counseling or therapy. ?Lifestyle adjustments to help you sleep better. ?Follow these instructions at home: ?Eating and drinking ? ?Limit or avoid alcohol, caffeinated beverages, and products that contain nicotine and tobacco, especially close to bedtime. These can disrupt your sleep. ?Do not eat a large meal or eat spicy foods right before bedtime. This can lead to digestive discomfort that can make it hard for you to sleep. ?Sleep habits ? ?Keep a sleep diary to help you and your health care provider figure out what could be causing your insomnia. Write down: ?When you sleep. ?When you wake up during the night. ?How well you sleep and how rested you feel the next day. ?Any side effects of medicines you are taking. ?What you eat and drink. ?Make your bedroom a dark, comfortable place where it is easy to fall asleep. ?Put up shades or blackout curtains to block light from outside. ?Use a white noise machine to block noise. ?Keep the temperature cool. ?Limit screen use before bedtime. This includes: ?Not watching TV. ?Not using your smartphone, tablet, or computer. ?Stick to a routine that includes going to  bed and waking up at the same times every day and night. This can help you fall asleep faster.  Consider making a quiet activity, such as reading, part of your nighttime routine. ?Try to avoid taking naps during the day so that you sleep better at night. ?Get out of bed if you are still awake after 15 minutes of trying to sleep. Keep the lights down, but try reading or doing a quiet activity. When you feel sleepy, go back to bed. ?General instructions ?Take over-the-counter and prescription medicines only as told by your health care provider. ?Exercise regularly as told by your health care provider. However, avoid exercising in the hours right before bedtime. ?Use relaxation techniques to manage stress. Ask your health care provider to suggest some techniques that may work well for you. These may include: ?Breathing exercises. ?Routines to release muscle tension. ?Visualizing peaceful scenes. ?Make sure that you drive carefully. Do not drive if you feel very sleepy. ?Keep all follow-up visits. This is important. ?Contact a health care provider if: ?You are tired throughout the day. ?You have trouble in your daily routine due to sleepiness. ?You continue to have sleep problems, or your sleep problems get worse. ?Get help right away if: ?You have thoughts about hurting yourself or someone else. ?Get help right away if you feel like you may hurt yourself or others, or have thoughts about taking your own life. Go to your nearest emergency room or: ?Call 911. ?Call the [facility] at [phone] or [phone]. This is open 24 hours a day. ?Text the Crisis Text Line at 831-887-0279. ?Summary ?Insomnia is a sleep disorder that makes it difficult to fall asleep or stay asleep. ?Insomnia can be long-term (chronic) or short-term (acute). ?Treatment for insomnia depends on the cause. Treatment may focus on treating an underlying condition that is causing the insomnia. ?Keep a sleep diary to help you and your health care provider figure out what could be causing your insomnia. ?This information is not  intended to replace advice given to you by your health care provider. Make sure you discuss any questions you have with your health care provider. ?Document Revised: 04/26/2021 Document Reviewed: 04/26/2021 ?Elsevier Patient Education ? [employer] [place]. ? ? ?Managing Depression, Adult ?Depression is a mental health condition that affects your thoughts, feelings, and actions. Being diagnosed with depression can bring you relief if you did not know why you have felt or behaved a certain way. It could also leave you feeling overwhelmed with uncertainty about your future. Preparing yourself to manage your symptoms can help you feel more positive about your future. ?How to manage lifestyle changes ?Managing stress ? ?Stress is your body's reaction to life changes and events, both good and bad. Stress can add to your feelings of depression. Learning to manage your stress can help lessen your feelings of depression. ?Try some of the following approaches to reducing your stress (stress reduction techniques): ?Listen to music that you enjoy and that inspires you. ?Try using a meditation app or take a meditation class. ?Develop a practice that helps you connect with your spiritual self. Walk in nature, pray, or go to a place of worship. ?Do some deep breathing. To do this, inhale slowly through your nose. Pause at the top of your inhale for a few seconds and then exhale slowly, letting your muscles relax. ?Practice yoga to help relax and work your muscles. ?Choose a stress reduction technique that suits your lifestyle and personality. These techniques take  time and practice to develop. Set aside 5-15 minutes a day to do them. Therapists can offer training in these techniques. Other things you can do to manage stress include: ?Keeping a stress diary. ?Knowing your limits and saying no when you think something is too much. ?Paying attention to how you react to certain situations. You may not be able to control everything, but  you can change your reaction. ?Adding humor to your life by watching funny films or TV shows. ?Making time for activities that you enjoy and that relax you. ? ?Medicines ?Medicines, such as antidepressants, are o

## 2021-11-02 ENCOUNTER — Encounter: Payer: Self-pay | Admitting: Family Medicine

## 2021-11-02 ENCOUNTER — Ambulatory Visit (INDEPENDENT_AMBULATORY_CARE_PROVIDER_SITE_OTHER): Payer: Medicare Other | Admitting: Family Medicine

## 2021-11-02 VITALS — BP 118/66 | HR 64 | Temp 98.0°F | Resp 16 | Ht 67.5 in | Wt 168.7 lb

## 2021-11-02 DIAGNOSIS — R0982 Postnasal drip: Secondary | ICD-10-CM | POA: Diagnosis not present

## 2021-11-02 DIAGNOSIS — R059 Cough, unspecified: Secondary | ICD-10-CM | POA: Diagnosis not present

## 2021-11-02 DIAGNOSIS — G4700 Insomnia, unspecified: Secondary | ICD-10-CM | POA: Diagnosis not present

## 2021-11-02 DIAGNOSIS — R4589 Other symptoms and signs involving emotional state: Secondary | ICD-10-CM

## 2021-11-02 DIAGNOSIS — R5383 Other fatigue: Secondary | ICD-10-CM

## 2021-11-02 DIAGNOSIS — R351 Nocturia: Secondary | ICD-10-CM

## 2021-11-02 DIAGNOSIS — R0609 Other forms of dyspnea: Secondary | ICD-10-CM

## 2021-11-02 MED ORDER — AZELASTINE HCL 0.1 % NA SOLN: 1.0000 | Freq: Every evening | NASAL | 12 refills | Status: DC | PRN

## 2021-11-02 NOTE — Patient Instructions (Addendum)
Stay on trazodone for now. If depression symptoms not improved in next 2 weeks, increase to 2 pills per day and let me know if you make that change.  Continue same dose of alfuzosin, limit fluids 1-2 hours before bedtime, but make sure to drink plenty of fluids during the day to help prevent dizziness.  If fatigue is not improving with starting back on trazodone let me know, recheck 1 month. Try the Astelin nasal spray at bedtime to see if that helps with cough.  Please call us and let us know the nasal spray that you are using at home. Please call  your cardiologist and let them know about your shortness of breath with activity.  If any new or worsening shortness of breath, any chest pain, or other new symptoms be seen in the emergency room. Return to the clinic or go to the nearest emergency room if any of your symptoms worsen or new symptoms occur.
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
## 2021-11-03 ENCOUNTER — Ambulatory Visit: Payer: Medicare Other | Admitting: Family Medicine
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
## 2021-12-01 ENCOUNTER — Ambulatory Visit: Payer: Medicare Other | Admitting: Cardiovascular Disease

## 2021-12-01 ENCOUNTER — Encounter: Payer: Self-pay | Admitting: Cardiovascular Disease

## 2021-12-01 VITALS — BP 120/68 | HR 65 | Ht 68.0 in | Wt 172.4 lb

## 2021-12-01 DIAGNOSIS — I2510 Atherosclerotic heart disease of native coronary artery without angina pectoris: Secondary | ICD-10-CM | POA: Diagnosis not present

## 2021-12-01 DIAGNOSIS — R0600 Dyspnea, unspecified: Secondary | ICD-10-CM

## 2021-12-01 NOTE — Patient Instructions (Signed)
Medication Instructions:  Your physician recommends that you continue on your current medications as directed. Please refer to the Current Medication list given to you today.  *If you need a refill on your cardiac medications before your next appointment, please call your pharmacy*   Lab Work: NONE   If you have labs (blood work) drawn today and your tests are completely normal, you will receive your results only by: [place] (if you have MyChart) OR A paper copy in the mail If you have any lab test that is abnormal or we need to change your treatment, we will call you to review the results.   Testing/Procedures: Non Contrast chest CT    Follow-Up: At [facility], you and your health needs are our priority.  As part of our continuing mission to provide you with exceptional heart care, we have created designated Provider Care Teams.  These Care Teams include your primary Cardiologist (physician) and Advanced Practice Providers (APPs -  Physician Assistants and Nurse Practitioners) who all work together to provide you with the care you need, when you need it.  We recommend signing up for the patient portal called "MyChart".  Sign up information is provided on this After Visit Summary.  MyChart is used to connect with patients for Virtual Visits (Telemedicine).  Patients are able to view lab/test results, encounter notes, upcoming appointments, etc.  Non-urgent messages can be sent to your provider as well.   To learn more about what you can do with MyChart, go to [email or web address].    Your next appointment:   6 month(s)  The format for your next appointment:   In Person  Provider:   [name], MD    Other Instructions Thank you for choosing [facility]!    Important Information About Sugar

## 2021-12-06 ENCOUNTER — Encounter: Payer: Self-pay | Admitting: Family Medicine

## 2021-12-06 ENCOUNTER — Ambulatory Visit (INDEPENDENT_AMBULATORY_CARE_PROVIDER_SITE_OTHER): Payer: Medicare Other | Admitting: Family Medicine

## 2021-12-06 VITALS — BP 110/62 | HR 68 | Temp 98.0°F | Resp 16 | Ht 68.0 in | Wt 169.6 lb

## 2021-12-06 DIAGNOSIS — G4700 Insomnia, unspecified: Secondary | ICD-10-CM

## 2021-12-06 DIAGNOSIS — R0982 Postnasal drip: Secondary | ICD-10-CM

## 2021-12-06 DIAGNOSIS — R4589 Other symptoms and signs involving emotional state: Secondary | ICD-10-CM | POA: Diagnosis not present

## 2021-12-06 DIAGNOSIS — R059 Cough, unspecified: Secondary | ICD-10-CM
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
## 2021-12-06 NOTE — Patient Instructions (Addendum)
Keep follow up with Dr. Melvyn Novas to discuss cough, but I recommend trying the astelin nasal spray for now. That may help with the congestion and cough.  If congestion not improving with spray, or any change in hearing let me know and I can refer you to ENT doctor.   No change in trazodone for now. Glad to hear you are feeling better.

## 2022-01-06 ENCOUNTER — Encounter: Payer: Self-pay | Admitting: Family Medicine

## 2022-01-06 ENCOUNTER — Ambulatory Visit (INDEPENDENT_AMBULATORY_CARE_PROVIDER_SITE_OTHER): Payer: Medicare Other | Admitting: Family Medicine

## 2022-01-06 VITALS — BP 120/60 | HR 63 | Resp 17 | Ht 68.0 in | Wt 172.6 lb

## 2022-01-06 DIAGNOSIS — I10 Essential (primary) hypertension: Secondary | ICD-10-CM | POA: Diagnosis not present

## 2022-01-06 DIAGNOSIS — R059 Cough, unspecified: Secondary | ICD-10-CM

## 2022-01-06 DIAGNOSIS — G4700 Insomnia, unspecified: Secondary | ICD-10-CM | POA: Diagnosis not present

## 2022-01-06 DIAGNOSIS — I959 Hypotension, unspecified: Secondary | ICD-10-CM | POA: Diagnosis not present

## 2022-01-06 DIAGNOSIS — R0982 Postnasal drip: Secondary | ICD-10-CM | POA: Diagnosis not present

## 2022-01-06 LAB — BASIC METABOLIC PANEL
BUN: 12 mg/dL (ref 6–23)
CO2: 27 mEq/L (ref 19–32)
Calcium: 9.6 mg/dL (ref 8.4–10.5)
Chloride: 104 mEq/L (ref 96–112)
Creatinine, Ser: 1 mg/dL (ref 0.40–1.50)
GFR: 72.4 mL/min (ref >60.00)
Glucose, Bld: 92 mg/dL (ref 70–99)
Potassium: 4.9 mEq/L (ref 3.5–5.1)
Sodium: 137 mEq/L (ref 135–145)
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
## 2022-01-06 NOTE — Patient Instructions (Addendum)
Low blood pressure last week may have been dehydration or not enough water. Blood pressure today looks ok, but if lower readings at home can decrease your amlodipine to 1/2 pill per day until seen by your cardiologist. Make sure you are drinking plenty of fluids, and take breaks - especially when it is hot outside.   No change in trazodone for now.

## 2022-01-10 ENCOUNTER — Ambulatory Visit: Payer: Medicare Other | Admitting: Internal Medicine

## 2022-01-10 ENCOUNTER — Ambulatory Visit: Payer: PRIVATE HEALTH INSURANCE | Admitting: Family Medicine

## 2022-01-10 ENCOUNTER — Encounter: Payer: Self-pay | Admitting: Internal Medicine

## 2022-01-10 DIAGNOSIS — R0609 Other forms of dyspnea: Secondary | ICD-10-CM | POA: Diagnosis not present
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
## 2022-01-10 NOTE — Assessment & Plan Note (Addendum)
Quit smoking age 76 s apparent sequelae - onset Jan 2021 p covid assoc with chronic cough worse at hs  - 01/10/2022   Walked on RA  x  3  lap(s) =  approx 450  ft  @ fast pace, stopped due to end of study with lowest 02 sats 93% and no sob  -  Chest ct ordered for 01/12/22   Symptoms are markedly disproportionate to objective findings and not clear to what extent this is actually a pulmonary  problem but pt does appear to have difficult to sort out respiratory symptoms of unknown origin for which  DDX  = almost all start with A and  include Adherence, Ace Inhibitors, Acid Reflux, Active Sinus Disease, Alpha 1 Antitripsin deficiency, Anxiety masquerading as Airways dz,  ABPA,  Allergy(esp in young), Aspiration (esp in elderly), Adverse effects of meds,  Active smoking or Vaping, A bunch of PE's/clot burden (a few small clots can't cause this syndrome unless there is already severe underlying pulm or vascular dz with poor reserve),  Anemia or thyroid disorder, plus two Bs  = Bronchiectasis and Beta blocker use..and one C= CHF    Adherence is always the initial "prime suspect" and is a multilayered concern that requires a "trust but verify" approach in every patient - starting with knowing how to use medications, especially inhalers, correctly, keeping up with refills and understanding the fundamental difference between maintenance and prns vs those medications only taken for a very short course and then stopped and not refilled.  - - The proper method of use, as well as anticipated side effects, of a metered-dose inhaler were discussed and demonstrated to the patient using teach back method.  - advised bring inhaler back next ov to confirm approp use  ? Allergy/asthma > doubt but keep in ddx Re SABA :  I spent extra time with pt today reviewing appropriate use of albuterol for prn use on exertion with the following points: 1) saba is for relief of sob that does not improve by walking a slower pace or  resting but rather if the pt does not improve after trying this first. 2) If the pt is convinced, as many are, that saba helps recover from activity faster then it's easy to tell if this is the case by re-challenging : ie stop, take the inhaler, then p 5 minutes try the exact same activity (intensity of workload) that just caused the symptoms and see if they are substantially diminished or not after saba 3) if there is an activity that reproducibly causes the symptoms, try the saba 15 min before the activity on alternate days   If in fact the saba really does help, then fine to continue to use it prn but advised may need to look closer at the maintenance regimen being used to achieve better control of airways disease with exertion.   ? Acid (or non-acid) GERD > always difficult to exclude as up to 75% of pts in some series report no assoc GI/ Heartburn symptoms and he had reflux issues before covid   - Of the three most common causes of  Sub-acute / recurrent or chronic cough, only one (GERD)  can actually contribute to/ trigger  the other two (asthma and post nasal drip syndrome)  and perpetuate the cylce of cough.  While not intuitively obvious, many patients with chronic low grade reflux do not cough until there is a primary insult that disturbs the protective epithelial barrier and exposes sensitive nerve  endings.   This is typically viral but can due to PNDS and  either may apply here.   The point is that once this occurs, it is difficult to eliminate the cycle  using anything but a maximally effective acid suppression regimen at least in the short run, accompanied by an appropriate diet to address non acid GERD and control / eliminate the cough itself for at least 3 days by using hard rock candy to suppress urge to clear throat   ? Anemia/ thyroid dz > may need repeat tsh if not done   ? chf >Echo 06/28/21 ok x mild MR and  bnp is well less than 100 with no cm on cxr so unlikely    F/u CT as  planned then return ov in 6 weeks to regroup, sooner if needed   Each maintenance medication was reviewed in detail including emphasizing most importantly the difference between maintenance and prns and under what circumstances the prns are to be triggered using an action plan format where appropriate.  Total time for H and P, chart review, counseling, reviewing hfa device(s) , directly observing portions of ambulatory 02 saturation study/ and generating customized AVS unique to this office visit / same day charting > 76mn for new pt with  multiple  refractory respiratory  symptoms of uncertain etiology

## 2022-01-10 NOTE — Patient Instructions (Signed)
We will walk you today for a baseline  I will review your Ct chest when available from 01/13/24   Change nexium to where you take Take 30-60 min before first meal of the day   GERD (REFLUX)  is an extremely common cause of respiratory symptoms just like yours , many times with no obvious heartburn at all.    It can be treated with medication, but also with lifestyle changes including elevation of the head of your bed (ideally with 6 -8inch blocks under the headboard of your bed),  Smoking cessation, avoidance of late meals, excessive alcohol, and avoid fatty foods, chocolate, peppermint, colas, red wine, and acidic juices such as orange juice.  NO MINT OR MENTHOL PRODUCTS SO NO COUGH DROPS  USE SUGARLESS CANDY INSTEAD (Jolley ranchers or Stover's or Life Savers) or even ice chips will also do - the key is to swallow to prevent all throat clearing. NO OIL BASED VITAMINS - use powdered substitutes.  Avoid fish oil when coughing.   Ok to try albuterol 15 min before an activity (on alternating days)  that you know would usually make you short of breath and see if it makes any difference and if makes none then don't take albuterol after activity unless you can't catch your breath as this means it's the resting that helps, not the albuterol.   Please schedule a follow up office visit in 6 weeks, call sooner if needed - bring your inhaler

## 2022-01-12 ENCOUNTER — Ambulatory Visit (HOSPITAL_COMMUNITY)
Admission: RE | Admit: 2022-01-12 | Discharge: 2022-01-12 | Disposition: A | Discharge status: Home / Self Care | Payer: Medicare Other | Source: Ambulatory Visit | Attending: Cardiovascular Disease | Admitting: Cardiovascular Disease

## 2022-01-12 ENCOUNTER — Telehealth: Payer: Self-pay | Admitting: Internal Medicine

## 2022-01-12 DIAGNOSIS — R059 Cough, unspecified: Secondary | ICD-10-CM

## 2022-01-12 DIAGNOSIS — R0600 Dyspnea, unspecified: Secondary | ICD-10-CM | POA: Insufficient documentation

## 2022-01-12 DIAGNOSIS — R0602 Shortness of breath: Secondary | ICD-10-CM

## 2022-01-12 DIAGNOSIS — I700 Atherosclerosis of aorta: Secondary | ICD-10-CM | POA: Diagnosis not present

## 2022-01-12 MED ORDER — ESOMEPRAZOLE MAGNESIUM 20 MG PO CPDR: 20.0000 mg | DELAYED_RELEASE_CAPSULE | Freq: Every day | ORAL | 1 refills | Status: DC

## 2022-01-12 MED ORDER — ALBUTEROL SULFATE HFA 108 (90 BASE) MCG/ACT IN AERS: 2.0000 | INHALATION_SPRAY | Freq: Four times a day (QID) | RESPIRATORY_TRACT | 2 refills | Status: DC | PRN

## 2022-01-12 NOTE — Telephone Encounter (Signed)
Medications refilled since Dr. Melvyn Novas recommended both at last ov on 01/10/22.  Called and notified patients wife. She voiced understanding. Nothing further needed.
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
## 2022-01-14 ENCOUNTER — Ambulatory Visit: Payer: Medicare Other | Admitting: Cardiovascular Disease

## 2022-01-14 ENCOUNTER — Encounter: Payer: Self-pay | Admitting: Cardiovascular Disease

## 2022-01-14 VITALS — BP 130/70 | HR 56 | Ht 68.0 in | Wt 168.9 lb

## 2022-01-14 DIAGNOSIS — I2510 Atherosclerotic heart disease of native coronary artery without angina pectoris: Secondary | ICD-10-CM | POA: Diagnosis not present

## 2022-01-14 DIAGNOSIS — R0600 Dyspnea, unspecified: Secondary | ICD-10-CM | POA: Diagnosis not present

## 2022-01-14 DIAGNOSIS — I10 Essential (primary) hypertension: Secondary | ICD-10-CM | POA: Diagnosis not present

## 2022-01-14 DIAGNOSIS — E782 Mixed hyperlipidemia: Secondary | ICD-10-CM | POA: Diagnosis not present

## 2022-01-14 NOTE — Patient Instructions (Signed)

## 2022-01-29 ENCOUNTER — Other Ambulatory Visit: Payer: Self-pay | Admitting: Family Medicine

## 2022-01-29 DIAGNOSIS — R4589 Other symptoms and signs involving emotional state: Secondary | ICD-10-CM

## 2022-01-29 DIAGNOSIS — G4700 Insomnia, unspecified: Secondary | ICD-10-CM

## 2022-02-01 NOTE — Telephone Encounter (Signed)
Pt wife called in stating that pt is out of the medication. She is aware that Dr. Carlota Raspberry isn't in the office today and the turn around time can take up to 72 hrs.

## 2022-02-01 NOTE — Telephone Encounter (Signed)
Pt is aware.  

## 2022-02-07 ENCOUNTER — Other Ambulatory Visit: Payer: Self-pay | Admitting: Family Medicine

## 2022-02-07 ENCOUNTER — Other Ambulatory Visit: Payer: Self-pay

## 2022-02-07 DIAGNOSIS — I10 Essential (primary) hypertension: Secondary | ICD-10-CM

## 2022-02-07 MED ORDER — NITROGLYCERIN 0.4 MG SL SUBL: SUBLINGUAL_TABLET | SUBLINGUAL | 10 refills | Status: DC

## 2022-02-07 NOTE — Telephone Encounter (Signed)
Pt's medication was sent to pt's pharmacy as requested. Confirmation received.  °

## 2022-02-08 ENCOUNTER — Other Ambulatory Visit: Payer: Self-pay | Admitting: Family Medicine

## 2022-02-08 DIAGNOSIS — I10 Essential (primary) hypertension: Secondary | ICD-10-CM

## 2022-02-09 DIAGNOSIS — Z961 Presence of intraocular lens: Secondary | ICD-10-CM | POA: Diagnosis not present

## 2022-02-15 ENCOUNTER — Telehealth: Payer: Self-pay | Admitting: Internal Medicine

## 2022-02-15 NOTE — Telephone Encounter (Signed)
Called and notified patients wife [name] of Dr. Morrison Old response. Nothing further needed.

## 2022-02-15 NOTE — Telephone Encounter (Signed)
Pt's wife states Dr. Melvyn Novas told pt to take Nexium 1 tablet twice a day.  Prescription was written for 1 a day and pt took as told and is out and too soon to refill.  Asking if he needs to take Nexium OTC until appt. next wk.    Please advise

## 2022-02-15 NOTE — Telephone Encounter (Signed)
Ok to supplement with otc or write new rx for #60 Take 30- 60 min before your first and last meals of the day

## 2022-02-21 ENCOUNTER — Encounter: Payer: Self-pay | Admitting: Internal Medicine

## 2022-02-21 ENCOUNTER — Ambulatory Visit: Payer: Medicare Other | Admitting: Internal Medicine

## 2022-02-21 DIAGNOSIS — R058 Other specified cough: Secondary | ICD-10-CM | POA: Diagnosis not present

## 2022-02-21 DIAGNOSIS — R0609 Other forms of dyspnea: Secondary | ICD-10-CM

## 2022-02-21 MED ORDER — BUDESONIDE-FORMOTEROL FUMARATE 80-4.5 MCG/ACT IN AERO: INHALATION_SPRAY | RESPIRATORY_TRACT | 12 refills | Status: DC

## 2022-02-21 MED ORDER — FAMOTIDINE 20 MG PO TABS: ORAL_TABLET | ORAL | 11 refills | Status: DC

## 2022-02-21 MED ORDER — ESOMEPRAZOLE MAGNESIUM 40 MG PO CPDR: DELAYED_RELEASE_CAPSULE | ORAL | 2 refills | Status: DC

## 2022-02-21 NOTE — Patient Instructions (Addendum)
Nexium 40 mg  Take 30-60 min before first meal of the day and pepcid 20 mg after supper   Plan A = Automatic = Always=    symbicort 80 Take 2 puffs first thing in am and then another 2 puffs about 12 hours later.     Plan B = Backup (to supplement plan A, not to replace it) Only use your albuterol inhaler as a rescue medication to be used if you can't catch your breath by resting or doing a relaxed purse lip breathing pattern.  - The less you use it, the better it will work when you need it. - Ok to use the inhaler up to 2 puffs  every 4 hours if you must but call for appointment if use goes up over your usual need - Don't leave home without it !!  (think of it like the spare tire for your car)   Please schedule a follow up visit in 2  months but call sooner if needed with PFTs in [place] office

## 2022-02-21 NOTE — Assessment & Plan Note (Signed)
Improved on gerd rx prior to adding symbiocort 80 02/21/2022  >>> continue gerd rx until return for pfts in 03/2023

## 2022-02-21 NOTE — Assessment & Plan Note (Signed)
Quit smoking age 76 s apparent sequelae - onset Jan 2021 p covid assoc with chronic cough worse at hs  - Echo 06/28/21 ok x mild MR  - 01/10/2022   Walked on RA  x  3  lap(s) =  approx 450  ft  @ fast pace, stopped due to end of study with lowest 02 sats 93% and no sob  -  Chest ct ordered for 01/12/22  1. Thin curvilinear parenchymal bands at both lung bases, compatible with nonspecific mild postinfectious/postinflammatory scarring. No active pulmonary disease. 2. Three-vessel coronary atherosclerosis. 3. Chronic moderate T9 vertebral compression fracture. Healed sternal deformity. - 02/21/2022  After extensive coaching inhaler device,  effectiveness =    75% rec trial of symb 80 2bid as improves symptomatically  saba and using avg bid  ? If has developed new AB post covid so rec trial of symbicort 80 2bid and come to [place] for pfts w/in next 3 months to sort out whether this corrects the problem or now  Reviewed rule of 2's          Each maintenance medication was reviewed in detail including emphasizing most importantly the difference between maintenance and prns and under what circumstances the prns are to be triggered using an action plan format where appropriate.  Total time for H and P, chart review, counseling, reviewing hfa  device(s) and generating customized AVS unique to this office visit / same day charting > 30 min new start on maint AB rx
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
## 2022-02-28 ENCOUNTER — Other Ambulatory Visit: Payer: Self-pay | Admitting: Family

## 2022-02-28 DIAGNOSIS — R4589 Other symptoms and signs involving emotional state: Secondary | ICD-10-CM

## 2022-02-28 DIAGNOSIS — G4700 Insomnia, unspecified: Secondary | ICD-10-CM

## 2022-03-02 ENCOUNTER — Telehealth: Payer: Self-pay | Admitting: Family Medicine

## 2022-03-02 DIAGNOSIS — R4589 Other symptoms and signs involving emotional state: Secondary | ICD-10-CM

## 2022-03-02 DIAGNOSIS — G4700 Insomnia, unspecified: Secondary | ICD-10-CM

## 2022-03-02 MED ORDER — TRAZODONE HCL 50 MG PO TABS: ORAL_TABLET | ORAL | 4 refills | Status: DC

## 2022-03-02 NOTE — Telephone Encounter (Signed)
Encourage patient to contact the pharmacy for refills or they can request refills through [facility]  (Please schedule appointment if patient has not been seen in over a year)    WHAT [place] TO: [place], [place]: trazodone 50 mg    NOTES/COMMENTS FROM PATIENT: pt needs a refill on medication       Front office please notify patient: It takes 48-72 hours to process rx refill requests Ask patient to call pharmacy to ensure rx is ready before heading there.

## 2022-03-09 ENCOUNTER — Ambulatory Visit (INDEPENDENT_AMBULATORY_CARE_PROVIDER_SITE_OTHER): Payer: Medicare Other | Admitting: Family Medicine

## 2022-03-09 ENCOUNTER — Encounter: Payer: Self-pay | Admitting: Family Medicine

## 2022-03-09 VITALS — BP 120/78 | HR 61 | Temp 97.8°F | Ht 67.0 in | Wt 172.2 lb

## 2022-03-09 DIAGNOSIS — R4589 Other symptoms and signs involving emotional state: Secondary | ICD-10-CM

## 2022-03-09 DIAGNOSIS — G4700 Insomnia, unspecified: Secondary | ICD-10-CM

## 2022-03-09 DIAGNOSIS — I10 Essential (primary) hypertension: Secondary | ICD-10-CM

## 2022-03-09 MED ORDER — ATORVASTATIN CALCIUM 80 MG PO TABS
80.0000 mg | ORAL_TABLET | Freq: Every day | ORAL | 3 refills | Status: DC
Start: 2022-03-09 — End: 2023-03-31

## 2022-03-09 MED ORDER — AMLODIPINE BESYLATE 5 MG PO TABS: 5.0000 mg | ORAL_TABLET | Freq: Every day | ORAL | 2 refills | Status: DC

## 2022-03-09 MED ORDER — TRAZODONE HCL 50 MG PO TABS: ORAL_TABLET | ORAL | 5 refills | Status: DC

## 2022-03-09 NOTE — Patient Instructions (Addendum)
Stop drinking caffeinated drinks including coffee before bedtime. None after lunchtime.  Continue trazodone '50mg'$ . If the decreased caffeine does not help with sleep, then can try taking 2 of the trazodone at bedtime. Let me know if this is not helping.   Insomnia Insomnia is a sleep disorder that makes it difficult to fall asleep or stay asleep. Insomnia can cause fatigue, low energy, difficulty concentrating, mood swings, and poor performance at work or school. There are three different ways to classify insomnia: Difficulty falling asleep. Difficulty staying asleep. Waking up too early in the morning. Any type of insomnia can be long-term (chronic) or short-term (acute). Both are common. Short-term insomnia usually lasts for 3 months or less. Chronic insomnia occurs at least three times a week for longer than 3 months. What are the causes? Insomnia may be caused by another condition, situation, or substance, such as: Having certain mental health conditions, such as anxiety and depression. Using caffeine, alcohol, tobacco, or drugs. Having gastrointestinal conditions, such as gastroesophageal reflux disease (GERD). Having certain medical conditions. These include: Asthma. Alzheimer's disease. Stroke. Chronic pain. An overactive thyroid gland (hyperthyroidism). Other sleep disorders, such as restless legs syndrome and sleep apnea. Menopause. Sometimes, the cause of insomnia may not be known. What increases the risk? Risk factors for insomnia include: Gender. Females are affected more often than males. Age. Insomnia is more common as people get older. Stress and certain medical and mental health conditions. Lack of exercise. Having an irregular work schedule. This may include working night shifts and traveling between different time zones. What are the signs or symptoms? If you have insomnia, the main symptom is having trouble falling asleep or having trouble staying asleep. This may  lead to other symptoms, such as: Feeling tired or having low energy. Feeling nervous about going to sleep. Not feeling rested in the morning. Having trouble concentrating. Feeling irritable, anxious, or depressed. How is this diagnosed? This condition may be diagnosed based on: Your symptoms and medical history. Your health care provider may ask about: Your sleep habits. Any medical conditions you have. Your mental health. A physical exam. How is this treated? Treatment for insomnia depends on the cause. Treatment may focus on treating an underlying condition that is causing the insomnia. Treatment may also include: Medicines to help you sleep. Counseling or therapy. Lifestyle adjustments to help you sleep better. Follow these instructions at home: Eating and drinking  Limit or avoid alcohol, caffeinated beverages, and products that contain nicotine and tobacco, especially close to bedtime. These can disrupt your sleep. Do not eat a large meal or eat spicy foods right before bedtime. This can lead to digestive discomfort that can make it hard for you to sleep. Sleep habits  Keep a sleep diary to help you and your health care provider figure out what could be causing your insomnia. Write down: When you sleep. When you wake up during the night. How well you sleep and how rested you feel the next day. Any side effects of medicines you are taking. What you eat and drink. Make your bedroom a dark, comfortable place where it is easy to fall asleep. Put up shades or blackout curtains to block light from outside. Use a white noise machine to block noise. Keep the temperature cool. Limit screen use before bedtime. This includes: Not watching TV. Not using your smartphone, tablet, or computer. Stick to a routine that includes going to bed and waking up at the same times every day and night. This  can help you fall asleep faster. Consider making a quiet activity, such as reading, part of  your nighttime routine. Try to avoid taking naps during the day so that you sleep better at night. Get out of bed if you are still awake after 15 minutes of trying to sleep. Keep the lights down, but try reading or doing a quiet activity. When you feel sleepy, go back to bed. General instructions Take over-the-counter and prescription medicines only as told by your health care provider. Exercise regularly as told by your health care provider. However, avoid exercising in the hours right before bedtime. Use relaxation techniques to manage stress. Ask your health care provider to suggest some techniques that may work well for you. These may include: Breathing exercises. Routines to release muscle tension. Visualizing peaceful scenes. Make sure that you drive carefully. Do not drive if you feel very sleepy. Keep all follow-up visits. This is important. Contact a health care provider if: You are tired throughout the day. You have trouble in your daily routine due to sleepiness. You continue to have sleep problems, or your sleep problems get worse. Get help right away if: You have thoughts about hurting yourself or someone else. Get help right away if you feel like you may hurt yourself or others, or have thoughts about taking your own life. Go to your nearest emergency room or: Call 911. Call the [place] at [phone] or 988. This is open 24 hours a day. Text the Crisis Text Line at 4136032759. Summary Insomnia is a sleep disorder that makes it difficult to fall asleep or stay asleep. Insomnia can be long-term (chronic) or short-term (acute). Treatment for insomnia depends on the cause. Treatment may focus on treating an underlying condition that is causing the insomnia. Keep a sleep diary to help you and your health care provider figure out what could be causing your insomnia. This information is not intended to replace advice given to you by your health care  provider. Make sure you discuss any questions you have with your health care provider. Document Revised: 04/26/2021 Document Reviewed: 04/26/2021 [employer] Patient Education  [place].
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
## 2022-04-12 DIAGNOSIS — N453 Epididymo-orchitis: Secondary | ICD-10-CM | POA: Diagnosis not present

## 2022-04-12 DIAGNOSIS — N3020 Other chronic cystitis without hematuria: Secondary | ICD-10-CM | POA: Diagnosis not present

## 2022-04-20 DIAGNOSIS — R8271 Bacteriuria: Secondary | ICD-10-CM | POA: Diagnosis not present

## 2022-04-20 DIAGNOSIS — N453 Epididymo-orchitis: Secondary | ICD-10-CM | POA: Diagnosis not present

## 2022-04-20 DIAGNOSIS — N3020 Other chronic cystitis without hematuria: Secondary | ICD-10-CM | POA: Diagnosis not present

## 2022-04-22 ENCOUNTER — Other Ambulatory Visit: Payer: Self-pay

## 2022-04-22 DIAGNOSIS — R0609 Other forms of dyspnea: Secondary | ICD-10-CM

## 2022-04-25 ENCOUNTER — Ambulatory Visit (INDEPENDENT_AMBULATORY_CARE_PROVIDER_SITE_OTHER): Payer: Medicare Other | Admitting: Internal Medicine

## 2022-04-25 ENCOUNTER — Encounter: Payer: Self-pay | Admitting: Internal Medicine

## 2022-04-25 VITALS — BP 112/60 | HR 65 | Temp 98.2°F | Ht 68.0 in | Wt 165.0 lb

## 2022-04-25 DIAGNOSIS — R058 Other specified cough: Secondary | ICD-10-CM

## 2022-04-25 DIAGNOSIS — R0609 Other forms of dyspnea: Secondary | ICD-10-CM

## 2022-04-25 LAB — PULMONARY FUNCTION TEST
DL/VA % pred: 91 %
DL/VA: 3.63 ml/min/mmHg/L
DLCO cor % pred: 93 %
DLCO cor: 21.8 ml/min/mmHg
DLCO unc % pred: 93 %
DLCO unc: 21.8 ml/min/mmHg
FEF 25-75 Post: 3.81 L/sec
FEF 25-75 Pre: 3.29 L/sec
FEF2575-%Change-Post: 15 %
FEF2575-%Pred-Post: 201 %
FEF2575-%Pred-Pre: 174 %
FEV1-%Change-Post: 3 %
FEV1-%Pred-Post: 119 %
FEV1-%Pred-Pre: 115 %
FEV1-Post: 3.22 L
FEV1-Pre: 3.12 L
FEV1FVC-%Change-Post: 1 %
FEV1FVC-%Pred-Pre: 112 %
FEV6-%Change-Post: 1 %
FEV6-%Pred-Post: 110 %
FEV6-%Pred-Pre: 108 %
FEV6-Post: 3.89 L
FEV6-Pre: 3.84 L
FEV6FVC-%Change-Post: 0 %
FEV6FVC-%Pred-Post: 107 %
FEV6FVC-%Pred-Pre: 106 %
FVC-%Change-Post: 1 %
FVC-%Pred-Post: 103 %
FVC-%Pred-Pre: 101 %
FVC-Post: 3.9 L
FVC-Pre: 3.85 L
Post FEV1/FVC ratio: 83 %
Post FEV6/FVC ratio: 100 %
Pre FEV1/FVC ratio: 81 %
Pre FEV6/FVC Ratio: 100 %
RV % pred: 118 %
RV: 2.96 L
TLC % pred: 104 %
TLC: 6.98 L

## 2022-04-25 NOTE — Patient Instructions (Signed)
Stay on nexilum 40 mg Take 30-60 min before first meal of the day and Pepcid 20 mg after supper   Try off symbicort and resume if cough/ breathing or need for albuterol increases, then resume the symbicort 80 Take 2 puffs first thing in am and then another 2 puffs about 12 hours later.     Please schedule a follow up office visit in 6 weeks, call sooner if needed with all medications /inhalers/ solutions in hand so we can verify exactly what you are taking. This includes all medications from all doctors and over the [place]

## 2022-04-25 NOTE — Progress Notes (Signed)
Full PFT completed today ? ?
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
## 2022-04-26 ENCOUNTER — Encounter: Payer: Self-pay | Admitting: Internal Medicine

## 2022-04-26 NOTE — Assessment & Plan Note (Signed)
Quit smoking age 76 s apparent sequelae - onset Jan 2021 p covid assoc with chronic cough worse at hs  - Echo 06/28/21 ok x mild MR  - 01/10/2022   Walked on RA  x  3  lap(s) =  approx 450  ft  @ fast pace, stopped due to end of study with lowest 02 sats 93% and no sob  -  Chest ct ordered for 01/12/22  1. Thin curvilinear parenchymal bands at both lung bases, compatible with nonspecific mild postinfectious/postinflammatory scarring. No active pulmonary disease. 2. Three-vessel coronary atherosclerosis. 3. Chronic moderate T9 vertebral compression fracture. Healed sternal deformity. - 02/21/2022  After extensive coaching inhaler device,  effectiveness =    75% rec trial of symb 80 2bid as improves symptomatically  saba and using avg bid - PFT's  04/25/2022 wnl on no inhalers > try off symbicort   No evidence to support asthma or copd at this point and reasonable to just use symb 80 prn Based on two studies from NEJM  378; 20 p 1865 (2018) and 380 : p2020-30 (2019) in pts with mild asthma it is reasonable to use low dose symbicort eg 80 2bid "prn" flare in this setting but I emphasized this was only shown with symbicort and takes advantage of the rapid onset of action but is not the same as "rescue therapy" but can be stopped once the acute symptoms have resolved and the need for rescue has been minimized (< 2 x weekly)

## 2022-04-26 NOTE — Assessment & Plan Note (Addendum)
Improved on gerd rx prior to adding symbiocort 80 02/21/2022  >>> 04/25/2022 improved on gerd rx with no pfts off symbicort x 4-5 days     His symptoms are clearly better and with nl pfts most likely they were not pulmonary in origin but rather Upper airway cough syndrome (previously labeled PNDS),  is so named because it's frequently impossible to sort out how much is  CR/sinusitis with freq throat clearing (which can be related to primary GERD)   vs  causing  secondary (" extra esophageal")  GERD from wide swings in gastric pressure that occur with throat clearing, often  promoting self use of mint and menthol lozenges that reduce the lower esophageal sphincter tone and exacerbate the problem further in a cyclical fashion.   These are the same pts (now being labeled as having "irritable larynx syndrome" by some cough centers) who not infrequently have a history of having failed to tolerate ace inhibitors,  dry powder inhalers or biphosphonates or report having atypical/extraesophageal reflux symptoms that don't respond to standard doses of PPI  and are easily confused as having aecopd or asthma flares by even experienced allergists/ pulmonologists (myself included).   Can stop symbicort for now and just use prn but stay on same rx for at least the next 6 weeks and then return with all meds in hand using a trust but verify approach to confirm accurate Medication  Reconciliation The principal here is that until we are certain that the  patients are doing what we've asked, it makes no sense to ask them to do more.          Each maintenance medication was reviewed in detail including emphasizing most importantly the difference between maintenance and prns and under what circumstances the prns are to be triggered using an action plan format where appropriate.  Total time for H and P, chart review, counseling, reviewing hfa device(s) and generating customized AVS unique to this office visit / same day  charting = 24 min

## 2022-05-18 ENCOUNTER — Other Ambulatory Visit: Payer: Self-pay | Admitting: Internal Medicine

## 2022-05-27 ENCOUNTER — Ambulatory Visit: Payer: Medicare Other | Admitting: Cardiovascular Disease
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
## 2022-06-13 ENCOUNTER — Ambulatory Visit: Payer: Medicare Other | Admitting: Internal Medicine

## 2022-06-13 ENCOUNTER — Encounter: Payer: Self-pay | Admitting: Internal Medicine

## 2022-06-13 VITALS — BP 122/66 | HR 78 | Temp 98.4°F | Ht 68.0 in | Wt 172.4 lb

## 2022-06-13 DIAGNOSIS — R0609 Other forms of dyspnea: Secondary | ICD-10-CM | POA: Diagnosis not present

## 2022-06-13 DIAGNOSIS — R058 Other specified cough: Secondary | ICD-10-CM

## 2022-06-13 NOTE — Patient Instructions (Signed)
Stop pepcid after supper unless the cough gets worse, then ok to restart it    If you are satisfied with your treatment plan,  let your doctor know and he/she can either refill your medications or you can return here when your prescription runs out.     If in any way you are not 100% satisfied,  please tell us.  If 100% better, tell your friends!  Pulmonary follow up is as needed

## 2022-06-13 NOTE — Assessment & Plan Note (Signed)
Quit smoking age 77 s apparent sequelae - onset Jan 2021 p covid assoc with chronic cough worse at hs  - Echo 06/28/21 ok x mild MR  - 01/10/2022   Walked on RA  x  3  lap(s) =  approx 450  ft  @ fast pace, stopped due to end of study with lowest 02 sats 93% and no sob  -  Chest ct ordered for 01/12/22  1. Thin curvilinear parenchymal bands at both lung bases, compatible with nonspecific mild postinfectious/postinflammatory scarring. No active pulmonary disease. 2. Three-vessel coronary atherosclerosis. 3. Chronic moderate T9 vertebral compression fracture. Healed sternal deformity. - 02/21/2022  After extensive coaching inhaler device,  effectiveness =    75% rec trial of symb 80 2bid as improves symptomatically  saba and using avg bid   >>> PFT's  04/25/2022 wnl on no inhalers > try off symbicort > improving doe 06/13/2022 s cough flare so leave off

## 2022-06-22 ENCOUNTER — Other Ambulatory Visit: Payer: Self-pay | Admitting: Internal Medicine

## 2022-07-11 ENCOUNTER — Ambulatory Visit: Payer: Medicare Other | Admitting: Family Medicine

## 2022-07-12 ENCOUNTER — Ambulatory Visit: Payer: Medicare Other | Admitting: Family Medicine

## 2022-07-20 ENCOUNTER — Other Ambulatory Visit: Payer: Self-pay | Admitting: Internal Medicine

## 2022-07-22 ENCOUNTER — Other Ambulatory Visit: Payer: Self-pay

## 2022-07-22 ENCOUNTER — Telehealth: Payer: Self-pay | Admitting: Internal Medicine

## 2022-07-22 NOTE — Telephone Encounter (Signed)
Patient's spouse called to inform the nurse that the script for ([place]) 40 MG  has not been sent to the pharmacy.  Patient is almost out of meds.  Please advise and call to give an update.  CB# [phone]

## 2022-07-22 NOTE — Telephone Encounter (Signed)
Would prefer he switch to prilosec 20 mg otc (or nexium 20)   30 min ac until sees PCP to discuss long term rx as don't really want to continue high doses indefinitely.

## 2022-07-22 NOTE — Telephone Encounter (Signed)
Attempted to call pt but unable to reach. Left message to return call.  

## 2022-08-08 ENCOUNTER — Ambulatory Visit: Payer: Medicare Other | Admitting: Family Medicine

## 2022-08-22 DIAGNOSIS — R3915 Urgency of urination: Secondary | ICD-10-CM | POA: Diagnosis not present

## 2022-08-22 DIAGNOSIS — N3020 Other chronic cystitis without hematuria: Secondary | ICD-10-CM | POA: Diagnosis not present

## 2022-09-02 ENCOUNTER — Telehealth: Payer: Self-pay | Admitting: Family Medicine

## 2022-09-02 NOTE — Telephone Encounter (Signed)
Called patient to schedule Medicare Annual Wellness Visit (AWV). Left message for patient to call back and schedule Medicare Annual Wellness Visit (AWV).  Last date of AWV: 06/01/2021   Please schedule an AWVS appointment at any time with [facility] ANNUAL WELLNESS VISIT.  If any questions, please contact me at [phone].    Thank you,  [facility] Direct dial  [phone]

## 2022-09-09 ENCOUNTER — Ambulatory Visit (INDEPENDENT_AMBULATORY_CARE_PROVIDER_SITE_OTHER): Payer: Medicare Other | Admitting: Family Medicine

## 2022-09-09 ENCOUNTER — Telehealth: Payer: Self-pay

## 2022-09-09 ENCOUNTER — Encounter: Payer: Self-pay | Admitting: Family Medicine

## 2022-09-09 VITALS — BP 102/54 | HR 65 | Temp 97.9°F | Ht 67.0 in | Wt 173.1 lb

## 2022-09-09 DIAGNOSIS — G4700 Insomnia, unspecified: Secondary | ICD-10-CM

## 2022-09-09 DIAGNOSIS — R0609 Other forms of dyspnea: Secondary | ICD-10-CM

## 2022-09-09 DIAGNOSIS — R42 Dizziness and giddiness: Secondary | ICD-10-CM | POA: Diagnosis not present

## 2022-09-09 DIAGNOSIS — I2510 Atherosclerotic heart disease of native coronary artery without angina pectoris: Secondary | ICD-10-CM | POA: Diagnosis not present

## 2022-09-09 DIAGNOSIS — I10 Essential (primary) hypertension: Secondary | ICD-10-CM

## 2022-09-09 DIAGNOSIS — Z1159 Encounter for screening for other viral diseases: Secondary | ICD-10-CM | POA: Diagnosis not present

## 2022-09-09 DIAGNOSIS — Z9861 Coronary angioplasty status: Secondary | ICD-10-CM

## 2022-09-09 DIAGNOSIS — Z0000 Encounter for general adult medical examination without abnormal findings: Secondary | ICD-10-CM

## 2022-09-09 DIAGNOSIS — N390 Urinary tract infection, site not specified: Secondary | ICD-10-CM | POA: Insufficient documentation

## 2022-09-09 DIAGNOSIS — R5383 Other fatigue: Secondary | ICD-10-CM | POA: Diagnosis not present

## 2022-09-09 LAB — CBC
HCT: 39.7 % (ref 39.0–52.0)
Hemoglobin: 13.4 g/dL (ref 13.0–17.0)
MCHC: 33.6 g/dL (ref 30.0–36.0)
MCV: 89.6 fl (ref 78.0–100.0)
Platelets: 209 10*3/uL (ref 150.0–400.0)
RBC: 4.44 Mil/uL (ref 4.22–5.81)
RDW: 12.7 % (ref 11.5–15.5)
WBC: 7.4 10*3/uL (ref 4.0–10.5)

## 2022-09-09 LAB — LIPID PANEL
Cholesterol: 101 mg/dL (ref 0–200)
HDL: 38.4 mg/dL — ABNORMAL LOW (ref >39.00)
LDL Cholesterol: 39 mg/dL (ref 0–99)
NonHDL: 62.31
Total CHOL/HDL Ratio: 3
Triglycerides: 117 mg/dL (ref 0.0–149.0)
VLDL: 23.4 mg/dL (ref 0.0–40.0)

## 2022-09-09 LAB — COMPREHENSIVE METABOLIC PANEL
ALT: 34 U/L (ref 0–53)
AST: 30 U/L (ref 0–37)
Albumin: 4.2 g/dL (ref 3.5–5.2)
Alkaline Phosphatase: 83 U/L (ref 39–117)
BUN: 15 mg/dL (ref 6–23)
CO2: 27 mEq/L (ref 19–32)
Calcium: 9.2 mg/dL (ref 8.4–10.5)
Chloride: 105 mEq/L (ref 96–112)
Creatinine, Ser: 0.98 mg/dL (ref 0.40–1.50)
GFR: 73.82 mL/min (ref >60.00)
Glucose, Bld: 106 mg/dL — ABNORMAL HIGH (ref 70–99)
Potassium: 4 mEq/L (ref 3.5–5.1)
Sodium: 139 mEq/L (ref 135–145)
Total Bilirubin: 0.6 mg/dL (ref 0.2–1.2)
Total Protein: 7.2 g/dL (ref 6.0–8.3)

## 2022-09-09 LAB — TSH: TSH: 3.17 u[IU]/mL (ref 0.35–5.50)

## 2022-09-09 MED ORDER — AMLODIPINE BESYLATE 2.5 MG PO TABS: 2.5000 mg | ORAL_TABLET | Freq: Every day | ORAL | 2 refills | Status: DC

## 2022-09-09 NOTE — Telephone Encounter (Signed)
-----   Message from Shade Flood, MD sent at 09/09/2022  2:44 PM EDT ----- Lab letter  Blood sugar a few points elevated but overall electrolytes looked okay.  Thyroid test and blood counts were normal.  Cholesterol levels overall looked okay and stable.  Keep follow-up with me in 1 month as planned but let me know if there are questions.  Dr. Neva Seat

## 2022-09-09 NOTE — Patient Instructions (Addendum)
Try lower dose amlodipine to see if less lightheadedness as blood pressure a little low.  I do recommend shingles, pneumonia and covid vaccines. Let me know if you have questions.  I do recommend dental visit.  I will check labs. Follow up in 1 month for follow up on fatigue, lower dose of amlodipine may help.  Take care.   Return to the clinic or go to the nearest emergency room if any of your symptoms worsen or new symptoms occur.   Preventive Care 21 Years and Older, Male Preventive care refers to lifestyle choices and visits with your health care provider that can promote health and wellness. Preventive care visits are also called wellness exams. What can I expect for my preventive care visit? Counseling During your preventive care visit, your health care provider may ask about your: Medical history, including: Past medical problems. Family medical history. History of falls. Current health, including: Emotional well-being. Home life and relationship well-being. Sexual activity. Memory and ability to understand (cognition). Lifestyle, including: Alcohol, nicotine or tobacco, and drug use. Access to firearms. Diet, exercise, and sleep habits. Work and work Astronomer. Sunscreen use. Safety issues such as seatbelt and bike helmet use. Physical exam Your health care provider will check your: Height and weight. These may be used to calculate your BMI (body mass index). BMI is a measurement that tells if you are at a healthy weight. Waist circumference. This measures the distance around your waistline. This measurement also tells if you are at a healthy weight and may help predict your risk of certain diseases, such as type 2 diabetes and high blood pressure. Heart rate and blood pressure. Body temperature. Skin for abnormal spots. What immunizations do I need?  Vaccines are usually given at various ages, according to a schedule. Your health care provider will recommend vaccines  for you based on your age, medical history, and lifestyle or other factors, such as travel or where you work. What tests do I need? Screening Your health care provider may recommend screening tests for certain conditions. This may include: Lipid and cholesterol levels. Diabetes screening. This is done by checking your blood sugar (glucose) after you have not eaten for a while (fasting). Hepatitis C test. Hepatitis B test. HIV (human immunodeficiency virus) test. STI (sexually transmitted infection) testing, if you are at risk. Lung cancer screening. Colorectal cancer screening. Prostate cancer screening. Abdominal aortic aneurysm (AAA) screening. You may need this if you are a current or former smoker. Talk with your health care provider about your test results, treatment options, and if necessary, the need for more tests. Follow these instructions at home: Eating and drinking  Eat a diet that includes fresh fruits and vegetables, whole grains, lean protein, and low-fat dairy products. Limit your intake of foods with high amounts of sugar, saturated fats, and salt. Take vitamin and mineral supplements as recommended by your health care provider. Do not drink alcohol if your health care provider tells you not to drink. If you drink alcohol: Limit how much you have to 0-2 drinks a day. Know how much alcohol is in your drink. In the [place] one drink equals one 12 oz bottle of beer (355 mL), one 5 oz glass of wine (148 mL), or one 1 oz glass of hard liquor (44 mL). Lifestyle Brush your teeth every morning and night with fluoride toothpaste. Floss one time each day. Exercise for at least 30 minutes 5 or more days each week. Do not use any products that  contain nicotine or tobacco. These products include cigarettes, chewing tobacco, and vaping devices, such as e-cigarettes. If you need help quitting, ask your health care provider. Do not use drugs. If you are sexually active, practice safe  sex. Use a condom or other form of protection to prevent STIs. Take aspirin only as told by your health care provider. Make sure that you understand how much to take and what form to take. Work with your health care provider to find out whether it is safe and beneficial for you to take aspirin daily. Ask your health care provider if you need to take a cholesterol-lowering medicine (statin). Find healthy ways to manage stress, such as: Meditation, yoga, or listening to music. Journaling. Talking to a trusted person. Spending time with friends and family. Safety Always wear your seat belt while driving or riding in a vehicle. Do not drive: If you have been drinking alcohol. Do not ride with someone who has been drinking. When you are tired or distracted. While texting. If you have been using any mind-altering substances or drugs. Wear a helmet and other protective equipment during sports activities. If you have firearms in your house, make sure you follow all gun safety procedures. Minimize exposure to UV radiation to reduce your risk of skin cancer. What's next? Visit your health care provider once a year for an annual wellness visit. Ask your health care provider how often you should have your eyes and teeth checked. Stay up to date on all vaccines. This information is not intended to replace advice given to you by your health care provider. Make sure you discuss any questions you have with your health care provider. Document Revised: 11/11/2020 Document Reviewed: 11/11/2020 [employer]vier Patient Education  2023 [employer].

## 2022-09-09 NOTE — Telephone Encounter (Signed)
Pt aware of results 
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
## 2022-09-10 LAB — HEPATITIS C ANTIBODY: Hepatitis C Ab: NONREACTIVE

## 2022-09-26 ENCOUNTER — Encounter: Payer: PRIVATE HEALTH INSURANCE | Admitting: Internal Medicine

## 2022-09-28 ENCOUNTER — Telehealth: Payer: Self-pay | Admitting: Family Medicine

## 2022-09-28 NOTE — Telephone Encounter (Signed)
Contacted [name] to schedule their annual wellness visit. Appointment made for 10/03/2022.  Thank you,  [facility] Support [facility] Direct dial  [phone]

## 2022-10-04 ENCOUNTER — Other Ambulatory Visit: Payer: Self-pay | Admitting: Family Medicine

## 2022-10-04 DIAGNOSIS — G4700 Insomnia, unspecified: Secondary | ICD-10-CM

## 2022-10-04 DIAGNOSIS — R4589 Other symptoms and signs involving emotional state: Secondary | ICD-10-CM

## 2022-10-05 ENCOUNTER — Ambulatory Visit (INDEPENDENT_AMBULATORY_CARE_PROVIDER_SITE_OTHER): Payer: Medicare Other | Admitting: *Deleted

## 2022-10-05 DIAGNOSIS — Z0000 Encounter for general adult medical examination without abnormal findings: Secondary | ICD-10-CM | POA: Diagnosis not present

## 2022-10-05 NOTE — Patient Instructions (Signed)
Mr. Brandon [name] , Thank you for taking time to come for your Medicare Wellness Visit. I appreciate your ongoing commitment to your health goals. Please review the following plan we discussed and let me know if I can assist you in the future.   Screening recommendations/referrals: Colonoscopy: no longer required Recommended yearly ophthalmology/optometry visit for glaucoma screening and checkup Recommended yearly dental visit for hygiene and checkup  Vaccinations: Influenza vaccine: Education provided Pneumococcal vaccine: Education provided Tdap vaccine: up to date Shingles vaccine: Education provided    Advanced directives: Education provided  Conditions/risks identified:     Preventive Care 65 Years and Older, Male Preventive care refers to lifestyle choices and visits with your health care provider that can promote health and wellness. What does preventive care include? A yearly physical exam. This is also called an annual well check. Dental exams once or twice a year. Routine eye exams. Ask your health care provider how often you should have your eyes checked. Personal lifestyle choices, including: Daily care of your teeth and gums. Regular physical activity. Eating a healthy diet. Avoiding tobacco and drug use. Limiting alcohol use. Practicing safe sex. Taking low doses of aspirin every day. Taking vitamin and mineral supplements as recommended by your health care provider. What happens during an annual well check? The services and screenings done by your health care provider during your annual well check will depend on your age, overall health, lifestyle risk factors, and family history of disease. Counseling  Your health care provider may ask you questions about your: Alcohol use. Tobacco use. Drug use. Emotional well-being. Home and relationship well-being. Sexual activity. Eating habits. History of falls. Memory and ability to understand (cognition). Work and work  Astronomer. Screening  You may have the following tests or measurements: Height, weight, and BMI. Blood pressure. Lipid and cholesterol levels. These may be checked every 5 years, or more frequently if you are over 44 years old. Skin check. Lung cancer screening. You may have this screening every year starting at age 42 if you have a 30-pack-year history of smoking and currently smoke or have quit within the past 15 years. Fecal occult blood test (FOBT) of the stool. You may have this test every year starting at age 56. Flexible sigmoidoscopy or colonoscopy. You may have a sigmoidoscopy every 5 years or a colonoscopy every 10 years starting at age 15. Prostate cancer screening. Recommendations will vary depending on your family history and other risks. Hepatitis C blood test. Hepatitis B blood test. Sexually transmitted disease (STD) testing. Diabetes screening. This is done by checking your blood sugar (glucose) after you have not eaten for a while (fasting). You may have this done every 1-3 years. Abdominal aortic aneurysm (AAA) screening. You may need this if you are a current or former smoker. Osteoporosis. You may be screened starting at age 97 if you are at high risk. Talk with your health care provider about your test results, treatment options, and if necessary, the need for more tests. Vaccines  Your health care provider may recommend certain vaccines, such as: Influenza vaccine. This is recommended every year. Tetanus, diphtheria, and acellular pertussis (Tdap, Td) vaccine. You may need a Td booster every 10 years. Zoster vaccine. You may need this after age 73. Pneumococcal 13-valent conjugate (PCV13) vaccine. One dose is recommended after age 45. Pneumococcal polysaccharide (PPSV23) vaccine. One dose is recommended after age 64. Talk to your health care provider about which screenings and vaccines you need and how often you need  them. This information is not intended to replace  advice given to you by your health care provider. Make sure you discuss any questions you have with your health care provider. Document Released: 06/12/2015 Document Revised: 02/03/2016 Document Reviewed: 03/17/2015 Elsevier Interactive Patient Education  2017 [place] Prevention in the Home Falls can cause injuries. They can happen to people of all ages. There are many things you can do to make your home safe and to help prevent falls. What can I do on the outside of my home? Regularly fix the edges of walkways and driveways and fix any cracks. Remove anything that might make you trip as you walk through a door, such as a raised step or threshold. Trim any bushes or trees on the path to your home. Use bright outdoor lighting. Clear any walking paths of anything that might make someone trip, such as rocks or tools. Regularly check to see if handrails are loose or broken. Make sure that both sides of any steps have handrails. Any raised decks and porches should have guardrails on the edges. Have any leaves, snow, or ice cleared regularly. Use sand or salt on walking paths during winter. Clean up any spills in your garage right away. This includes oil or grease spills. What can I do in the bathroom? Use night lights. Install grab bars by the toilet and in the tub and shower. Do not use towel bars as grab bars. Use non-skid mats or decals in the tub or shower. If you need to sit down in the shower, use a plastic, non-slip stool. Keep the floor dry. Clean up any water that spills on the floor as soon as it happens. Remove soap buildup in the tub or shower regularly. Attach bath mats securely with double-sided non-slip rug tape. Do not have throw rugs and other things on the floor that can make you trip. What can I do in the bedroom? Use night lights. Make sure that you have a light by your bed that is easy to reach. Do not use any sheets or blankets that are too big for your bed.  They should not hang down onto the floor. Have a firm chair that has side arms. You can use this for support while you get dressed. Do not have throw rugs and other things on the floor that can make you trip. What can I do in the kitchen? Clean up any spills right away. Avoid walking on wet floors. Keep items that you use a lot in easy-to-reach places. If you need to reach something above you, use a strong step stool that has a grab bar. Keep electrical cords out of the way. Do not use floor polish or wax that makes floors slippery. If you must use wax, use non-skid floor wax. Do not have throw rugs and other things on the floor that can make you trip. What can I do with my stairs? Do not leave any items on the stairs. Make sure that there are handrails on both sides of the stairs and use them. Fix handrails that are broken or loose. Make sure that handrails are as long as the stairways. Check any carpeting to make sure that it is firmly attached to the stairs. Fix any carpet that is loose or worn. Avoid having throw rugs at the top or bottom of the stairs. If you do have throw rugs, attach them to the floor with carpet tape. Make sure that you have a light switch at  the top of the stairs and the bottom of the stairs. If you do not have them, ask someone to add them for you. What else can I do to help prevent falls? Wear shoes that: Do not have high heels. Have rubber bottoms. Are comfortable and fit you well. Are closed at the toe. Do not wear sandals. If you use a stepladder: Make sure that it is fully opened. Do not climb a closed stepladder. Make sure that both sides of the stepladder are locked into place. Ask someone to hold it for you, if possible. Clearly mark and make sure that you can see: Any grab bars or handrails. First and last steps. Where the edge of each step is. Use tools that help you move around (mobility aids) if they are needed. These  include: Canes. Walkers. Scooters. Crutches. Turn on the lights when you go into a dark area. Replace any light bulbs as soon as they burn out. Set up your furniture so you have a clear path. Avoid moving your furniture around. If any of your floors are uneven, fix them. If there are any pets around you, be aware of where they are. Review your medicines with your doctor. Some medicines can make you feel dizzy. This can increase your chance of falling. Ask your doctor what other things that you can do to help prevent falls. This information is not intended to replace advice given to you by your health care provider. Make sure you discuss any questions you have with your health care provider. Document Released: 03/12/2009 Document Revised: 10/22/2015 Document Reviewed: 06/20/2014 Elsevier Interactive Patient Education  2017 [employer].
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
## 2022-10-10 ENCOUNTER — Encounter: Payer: Self-pay | Admitting: Family Medicine

## 2022-10-10 ENCOUNTER — Ambulatory Visit (INDEPENDENT_AMBULATORY_CARE_PROVIDER_SITE_OTHER): Payer: Medicare Other | Admitting: Family Medicine

## 2022-10-10 VITALS — BP 124/64 | HR 63 | Temp 97.8°F | Resp 16 | Ht 67.0 in | Wt 168.8 lb

## 2022-10-10 DIAGNOSIS — R5383 Other fatigue: Secondary | ICD-10-CM | POA: Diagnosis not present

## 2022-10-10 DIAGNOSIS — R059 Cough, unspecified: Secondary | ICD-10-CM

## 2022-10-10 DIAGNOSIS — I10 Essential (primary) hypertension: Secondary | ICD-10-CM

## 2022-10-10 DIAGNOSIS — R058 Other specified cough: Secondary | ICD-10-CM

## 2022-10-10 MED ORDER — BUDESONIDE-FORMOTEROL FUMARATE 80-4.5 MCG/ACT IN AERO: 2.0000 | INHALATION_SPRAY | Freq: Two times a day (BID) | RESPIRATORY_TRACT | 3 refills | Status: DC

## 2022-10-10 NOTE — Patient Instructions (Addendum)
New blood pressure med dose looks good, no changes for now. If fatigue/lightheadedness returns, be seen.   Glad to hear the cough is overall better.  If you are still having persistent cough, can try the Pepcid with supper, and if that does not help I did reorder the Symbicort inhaler.  If cough persist I do recommend scheduling a follow-up visit with Dr. Sherene Sires.  Follow-up with me in 5 months but let me know if there are questions in the meantime.  Take care!
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
## 2022-10-31 ENCOUNTER — Other Ambulatory Visit: Payer: Self-pay | Admitting: Family Medicine

## 2022-10-31 DIAGNOSIS — G4700 Insomnia, unspecified: Secondary | ICD-10-CM

## 2022-10-31 DIAGNOSIS — R4589 Other symptoms and signs involving emotional state: Secondary | ICD-10-CM

## 2022-12-03 ENCOUNTER — Other Ambulatory Visit: Payer: Self-pay | Admitting: Family Medicine

## 2022-12-03 DIAGNOSIS — G4700 Insomnia, unspecified: Secondary | ICD-10-CM

## 2022-12-03 DIAGNOSIS — R4589 Other symptoms and signs involving emotional state: Secondary | ICD-10-CM

## 2022-12-05 MED ORDER — TRAZODONE HCL 50 MG PO TABS: ORAL_TABLET | ORAL | 3 refills | Status: DC

## 2022-12-05 NOTE — Addendum Note (Signed)
Addended by: Meredith Staggers R on: 12/05/2022 04:39 PM   Modules accepted: Orders

## 2022-12-05 NOTE — Telephone Encounter (Signed)
Patient is requesting a refill of the following medications: Requested Prescriptions   Pending Prescriptions Disp Refills   traZODone (DESYREL) 50 MG tablet [Pharmacy Med Name: TRAZODONE 50 MG TABLET] 30 tablet 0    Sig: TAKE (1/2) TO (1) TABLET BY MOUTH AT BEDTIME AS NEEDED.    Date of patient request: 12/05/22 Last office visit: 10/10/22 Date of last refill: 10/31/22 Last refill amount: 30 Follow up time period per chart: 5 months

## 2022-12-05 NOTE — Telephone Encounter (Signed)
Medication discussed in April.  Continue trazodone, refill ordered

## 2022-12-06 ENCOUNTER — Ambulatory Visit: Payer: Medicare Other | Admitting: Family Medicine

## 2022-12-06 ENCOUNTER — Telehealth: Payer: Self-pay | Admitting: Family Medicine

## 2022-12-06 NOTE — Telephone Encounter (Signed)
[facility] [place] [facility] Night - C Client Site [facility] - Night Provider [facility] - PHYSICIAN, Crissie Figures- MD Contact Type Call Who Is Calling Patient / Member / Family / Caregiver Caller Name Kristafer Aylesworth Caller Phone Number [phone] Patient Name Brandon [name] Patient DOB June 01, 1945 Call Type Message Only Information Provided Reason for Call Request to [facility] Appointment Initial Comment Caller states her husband has an appt at 840 today, unable to make it. Patient request to speak to RN No Additional Comment declined triage, unknown physician confirmed office address. Disp. Time Disposition Final User 12/06/2022 7:48:25 AM General Information Provided Yes Dennison Mascot Call Closed By: Dennison Mascot Transaction Date/Time: 12/06/2022 7:46:00 AM (ET  Pt's spouse called and canceled appt for today by after hours line.

## 2022-12-06 NOTE — Telephone Encounter (Signed)
This pt has been rescheduled

## 2022-12-27 IMAGING — DX DG CHEST 2V
2 series · 2 of 2 positions shown · non-contrast
Comparison: 03/04/2020

CLINICAL DATA: SOB

EXAM:
CHEST - 2 VIEW

[chest pa]
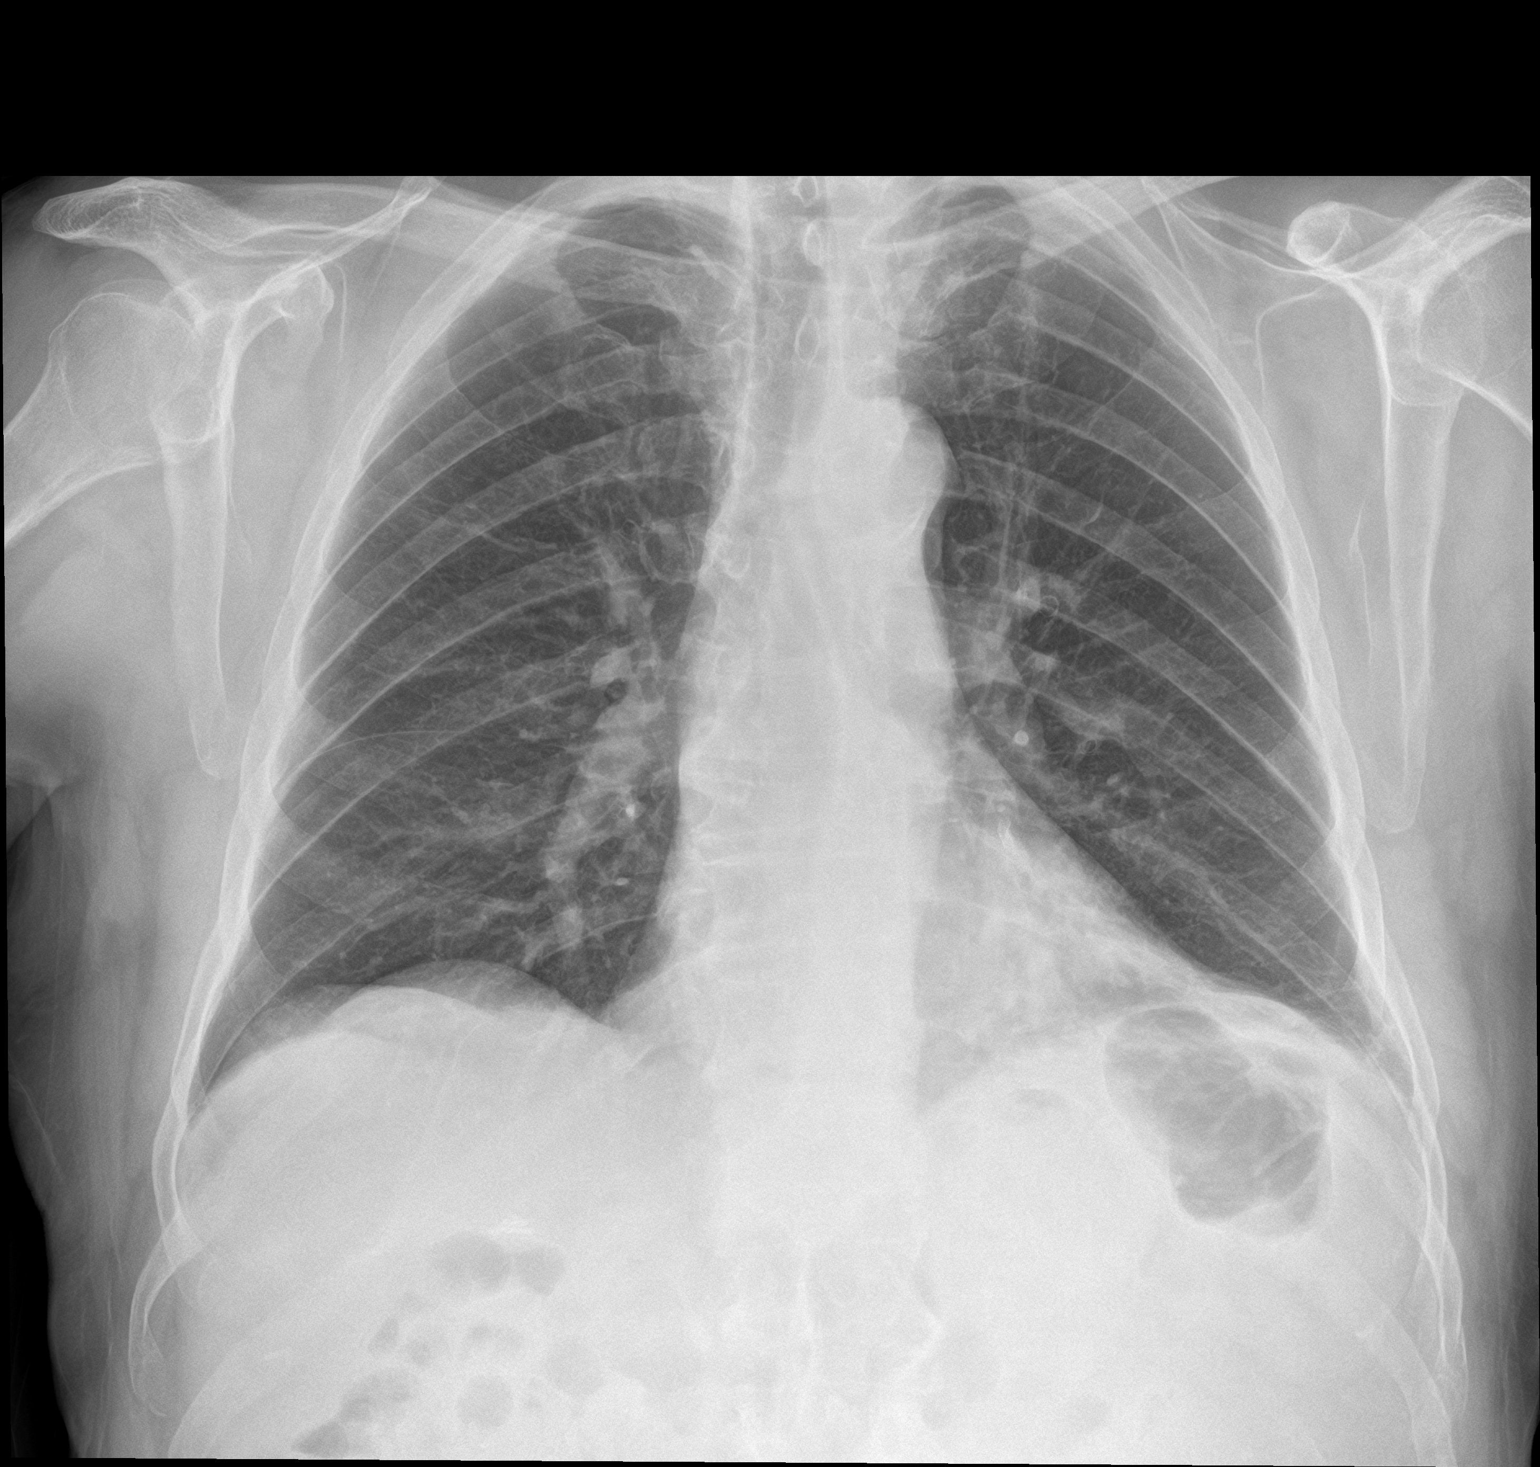

[chest lat]
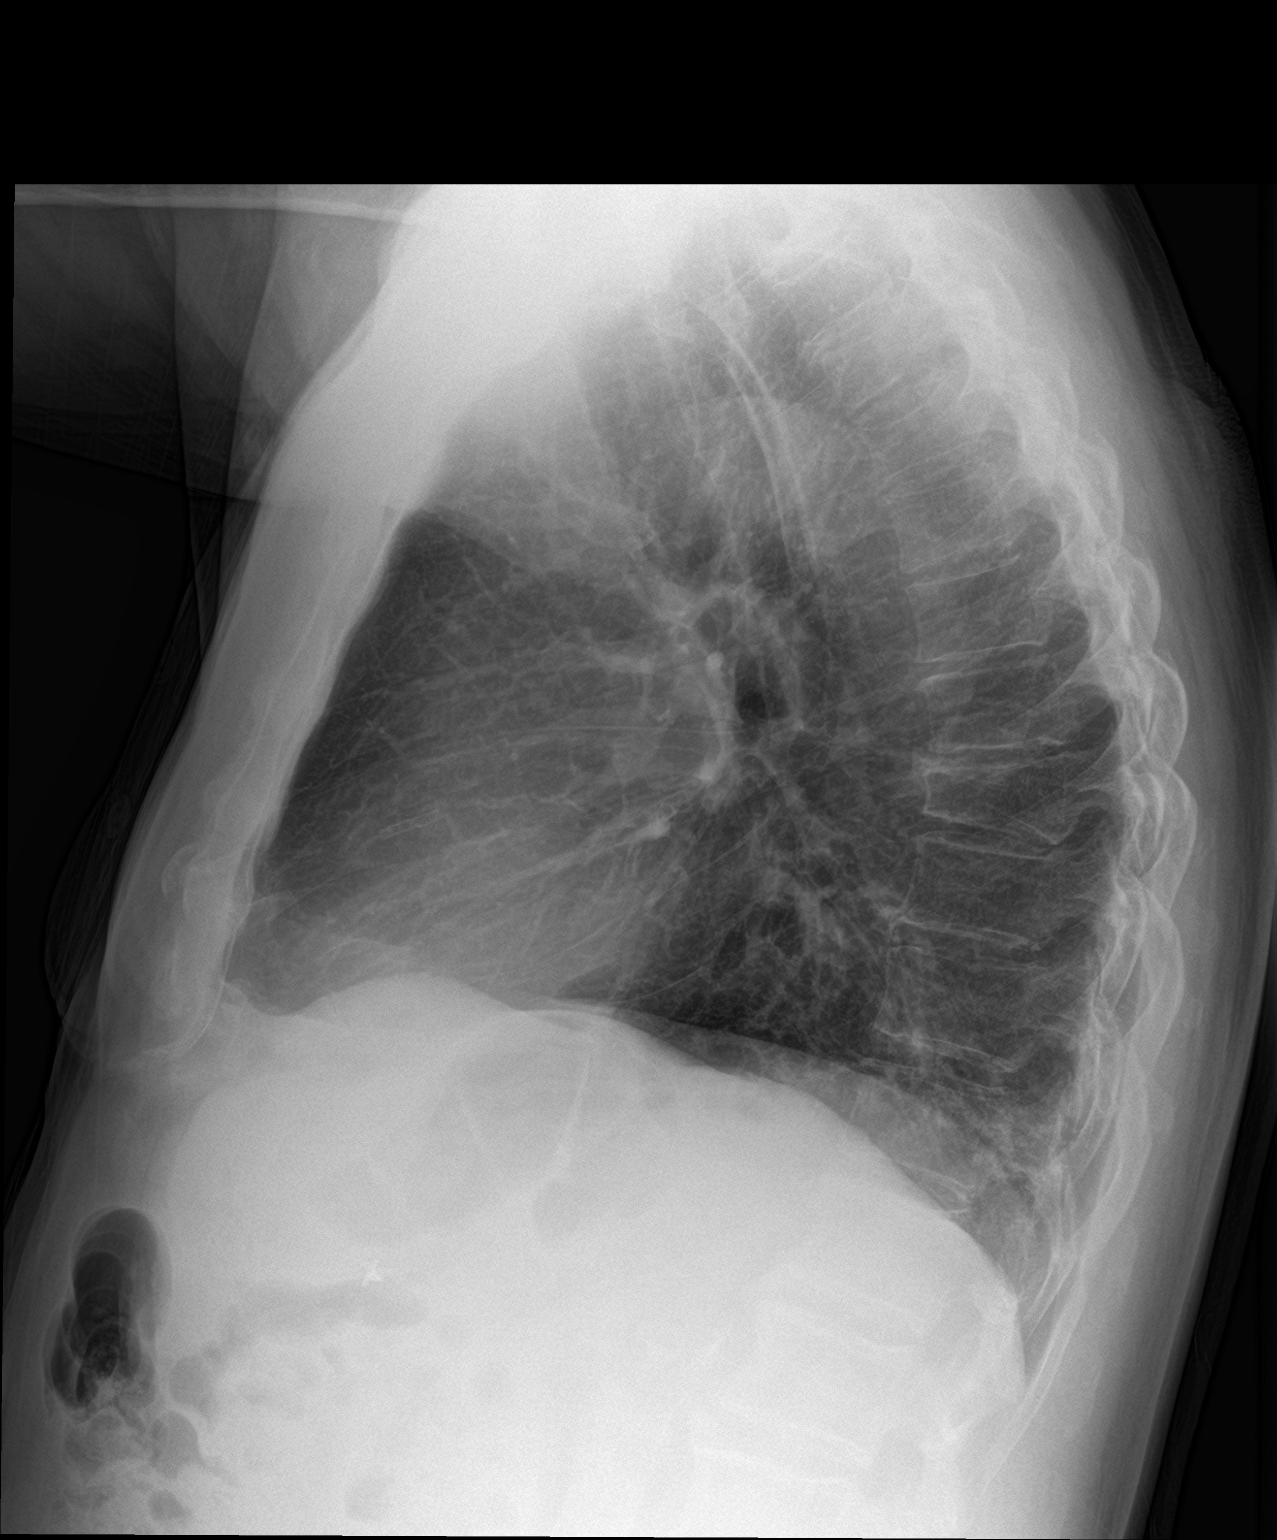

[2 of 2 positions shown; findings below may reference images not displayed]

FINDINGS: Cardiomediastinal silhouette and pulmonary vasculature are within
normal limits.

Lungs are clear.
IMPRESSION: No acute cardiopulmonary process.

## 2023-02-06 ENCOUNTER — Other Ambulatory Visit: Payer: Self-pay | Admitting: Internal Medicine

## 2023-02-27 DIAGNOSIS — R3914 Feeling of incomplete bladder emptying: Secondary | ICD-10-CM | POA: Diagnosis not present

## 2023-02-27 DIAGNOSIS — N3020 Other chronic cystitis without hematuria: Secondary | ICD-10-CM | POA: Diagnosis not present

## 2023-03-13 ENCOUNTER — Ambulatory Visit: Payer: Medicare Other | Admitting: Family Medicine

## 2023-03-13 VITALS — BP 120/60 | HR 67 | Temp 98.0°F | Ht 67.0 in | Wt 168.0 lb

## 2023-03-13 DIAGNOSIS — R5383 Other fatigue: Secondary | ICD-10-CM | POA: Diagnosis not present

## 2023-03-13 DIAGNOSIS — M25512 Pain in left shoulder: Secondary | ICD-10-CM

## 2023-03-13 DIAGNOSIS — I10 Essential (primary) hypertension: Secondary | ICD-10-CM

## 2023-03-13 DIAGNOSIS — R519 Headache, unspecified: Secondary | ICD-10-CM

## 2023-03-13 DIAGNOSIS — G44329 Chronic post-traumatic headache, not intractable: Secondary | ICD-10-CM | POA: Diagnosis not present

## 2023-03-13 DIAGNOSIS — R0609 Other forms of dyspnea: Secondary | ICD-10-CM

## 2023-03-13 DIAGNOSIS — Z8679 Personal history of other diseases of the circulatory system: Secondary | ICD-10-CM

## 2023-03-13 LAB — COMPREHENSIVE METABOLIC PANEL
ALT: 29 U/L (ref 0–53)
AST: 24 U/L (ref 0–37)
Albumin: 4.3 g/dL (ref 3.5–5.2)
Alkaline Phosphatase: 77 U/L (ref 39–117)
BUN: 17 mg/dL (ref 6–23)
CO2: 26 meq/L (ref 19–32)
Calcium: 9.7 mg/dL (ref 8.4–10.5)
Chloride: 103 meq/L (ref 96–112)
Creatinine, Ser: 1.1 mg/dL (ref 0.40–1.50)
GFR: 64.04 mL/min (ref >60.00)
Glucose, Bld: 89 mg/dL (ref 70–99)
Potassium: 4.4 meq/L (ref 3.5–5.1)
Sodium: 136 meq/L (ref 135–145)
Total Bilirubin: 0.5 mg/dL (ref 0.2–1.2)
Total Protein: 7.5 g/dL (ref 6.0–8.3)

## 2023-03-13 LAB — CBC WITH DIFFERENTIAL/PLATELET
Basophils Absolute: 0 10*3/uL (ref 0.0–0.1)
Basophils Relative: 0.2 % (ref 0.0–3.0)
Eosinophils Absolute: 0.1 10*3/uL (ref 0.0–0.7)
Eosinophils Relative: 1.3 % (ref 0.0–5.0)
HCT: 42.3 % (ref 39.0–52.0)
Hemoglobin: 13.7 g/dL (ref 13.0–17.0)
Lymphocytes Relative: 49 % — ABNORMAL HIGH (ref 12.0–46.0)
Lymphs Abs: 3.7 10*3/uL (ref 0.7–4.0)
MCHC: 32.3 g/dL (ref 30.0–36.0)
MCV: 91.3 fL (ref 78.0–100.0)
Monocytes Absolute: 0.5 10*3/uL (ref 0.1–1.0)
Monocytes Relative: 7.3 % (ref 3.0–12.0)
Neutro Abs: 3.2 10*3/uL (ref 1.4–7.7)
Neutrophils Relative %: 42.2 % — ABNORMAL LOW (ref 43.0–77.0)
Platelets: 230 10*3/uL (ref 150.0–400.0)
RBC: 4.63 Mil/uL (ref 4.22–5.81)
RDW: 13.5 % (ref 11.5–15.5)
WBC: 7.5 10*3/uL (ref 4.0–10.5)

## 2023-03-13 LAB — SEDIMENTATION RATE: Sed Rate: 10 mm/h (ref 0–20)

## 2023-03-13 NOTE — Patient Instructions (Addendum)
With continued headache, I will refer you to neurology. I am hesitant to start any new meds for now due to possible side effects with your current symptoms.  Return to the clinic or go to the nearest emergency room if any of your symptoms worsen or new symptoms occur.  Glad to hear your shoulder is improving. If that worsens again or does not continue to improve, be seen.   You can try symbicort, but I recommend follow up with heart doctor and lung doctor to discuss shortness of breath further. Stop amlodipine for now, I will check labs today. Try to drink at least 48 ounces of water per day. Keep a record of your blood pressures outside of the office and bring them to the next office visit in 2 weeks.  Return to the clinic or go to the nearest emergency room if any of your symptoms worsen or new symptoms occur.
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
## 2023-03-14 ENCOUNTER — Telehealth: Payer: Self-pay

## 2023-03-14 ENCOUNTER — Encounter: Payer: Self-pay | Admitting: Family Medicine

## 2023-03-14 NOTE — Telephone Encounter (Signed)
-----   Message from Shade Flood sent at 03/14/2023  3:26 PM EDT ----- Call patient.  Electrolytes, kidney, liver tests were okay.  Inflammation test was also normal, keep follow-up as planned with neurology to discuss headaches.  Borderline lymphocytes but overall blood counts looked okay, no anemia to explain his fatigue.  Continue same plan as last visit and follow-up in 2 weeks.  Let me know if there are questions.

## 2023-03-27 ENCOUNTER — Ambulatory Visit (INDEPENDENT_AMBULATORY_CARE_PROVIDER_SITE_OTHER)
Admission: RE | Admit: 2023-03-27 | Discharge: 2023-03-27 | Disposition: A | Discharge status: Home / Self Care | Payer: Medicare Other | Source: Ambulatory Visit | Attending: Family Medicine | Admitting: Family Medicine

## 2023-03-27 ENCOUNTER — Ambulatory Visit (INDEPENDENT_AMBULATORY_CARE_PROVIDER_SITE_OTHER): Payer: Medicare Other | Admitting: Family Medicine

## 2023-03-27 VITALS — BP 118/70 | HR 70 | Temp 98.8°F | Ht 67.0 in | Wt 165.2 lb

## 2023-03-27 DIAGNOSIS — I10 Essential (primary) hypertension: Secondary | ICD-10-CM | POA: Diagnosis not present

## 2023-03-27 DIAGNOSIS — R0609 Other forms of dyspnea: Secondary | ICD-10-CM | POA: Diagnosis not present

## 2023-03-27 DIAGNOSIS — R0600 Dyspnea, unspecified: Secondary | ICD-10-CM | POA: Diagnosis not present

## 2023-03-27 NOTE — Patient Instructions (Addendum)
Neurology left you a voicemail this morning to schedule appointment for your headaches. Let me know if you do not have that info when you get home.  Return to the clinic or go to the nearest emergency room if any of your symptoms worsen or new symptoms occur. Try using symbicort daily, with albuterol only if needed. Recheck in 3 weeks.  Glad you are improving form your infection last week. Please have xray today or tomorrow - see location below.  If any concerns on xray, I will let you know.   [facility] or xray: Walk in 8:30-4:30 during weekdays, no appointment needed [address]  [place], [place] [address]
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
## 2023-03-28 ENCOUNTER — Encounter: Payer: Self-pay | Admitting: Family Medicine

## 2023-03-29 ENCOUNTER — Telehealth: Payer: Self-pay | Admitting: Family Medicine

## 2023-03-29 DIAGNOSIS — G44329 Chronic post-traumatic headache, not intractable: Secondary | ICD-10-CM

## 2023-03-29 DIAGNOSIS — Z8679 Personal history of other diseases of the circulatory system: Secondary | ICD-10-CM
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
## 2023-03-29 NOTE — Telephone Encounter (Signed)
Wife called to let Dr. Neva Seat know that the soonest [facility] could get pt in for an appt is 2/19. Wife states that pt needs something he can take for these headaches he's been having.

## 2023-03-29 NOTE — Telephone Encounter (Signed)
Sent  to Dr.Greene 

## 2023-03-30 ENCOUNTER — Other Ambulatory Visit: Payer: Medicare Other

## 2023-03-30 NOTE — Telephone Encounter (Signed)
Pt has been informed and will wait on call to schedule scan

## 2023-03-31 ENCOUNTER — Other Ambulatory Visit: Payer: Self-pay | Admitting: Family Medicine

## 2023-04-05 ENCOUNTER — Inpatient Hospital Stay: Admission: RE | Admit: 2023-04-05 | Payer: Medicare Other | Source: Ambulatory Visit

## 2023-04-13 ENCOUNTER — Ambulatory Visit
Admission: RE | Admit: 2023-04-13 | Discharge: 2023-04-13 | Disposition: A | Discharge status: Home / Self Care | Payer: Medicare Other | Source: Ambulatory Visit | Attending: Family Medicine | Admitting: Family Medicine

## 2023-04-13 DIAGNOSIS — G44329 Chronic post-traumatic headache, not intractable: Secondary | ICD-10-CM

## 2023-04-13 DIAGNOSIS — R519 Headache, unspecified: Secondary | ICD-10-CM | POA: Diagnosis not present

## 2023-04-13 DIAGNOSIS — I6782 Cerebral ischemia: Secondary | ICD-10-CM | POA: Diagnosis not present

## 2023-04-13 DIAGNOSIS — Z8679 Personal history of other diseases of the circulatory system: Secondary | ICD-10-CM

## 2023-04-14 ENCOUNTER — Telehealth: Payer: Self-pay

## 2023-04-14 NOTE — Telephone Encounter (Signed)
-----   Message from Shade Flood sent at 04/14/2023  1:54 PM EST ----- Please call patient.  Good news, no evidence of acute intracranial abnormality.  Some chronic findings but no acute bleeding or concerns for his headaches.  There was some fluid within the middle ear and the mastoid which is the area behind the ear.  We can discuss this at his November 18 visit.  Let me know if there are questions.

## 2023-04-14 NOTE — Telephone Encounter (Signed)
Patients daughter has called back and been informed no questions

## 2023-04-14 NOTE — Telephone Encounter (Signed)
Called patient to discuss lab work, no answer, left a message for the patient to call back to discuss or review by MyChart if that is prefered.

## 2023-04-16 ENCOUNTER — Other Ambulatory Visit: Payer: Self-pay | Admitting: Internal Medicine

## 2023-04-17 ENCOUNTER — Ambulatory Visit: Payer: Medicare Other | Admitting: Family Medicine

## 2023-04-17 VITALS — BP 122/70 | HR 64 | Temp 98.6°F | Ht 67.0 in | Wt 168.2 lb

## 2023-04-17 DIAGNOSIS — R059 Cough, unspecified: Secondary | ICD-10-CM | POA: Diagnosis not present

## 2023-04-17 DIAGNOSIS — R0609 Other forms of dyspnea: Secondary | ICD-10-CM

## 2023-04-17 DIAGNOSIS — H538 Other visual disturbances: Secondary | ICD-10-CM | POA: Diagnosis not present

## 2023-04-17 DIAGNOSIS — H9201 Otalgia, right ear: Secondary | ICD-10-CM

## 2023-04-17 DIAGNOSIS — R058 Other specified cough: Secondary | ICD-10-CM

## 2023-04-17 DIAGNOSIS — R519 Headache, unspecified: Secondary | ICD-10-CM | POA: Diagnosis not present

## 2023-04-17 DIAGNOSIS — H5711 Ocular pain, right eye: Secondary | ICD-10-CM | POA: Diagnosis not present

## 2023-04-17 MED ORDER — FAMOTIDINE 20 MG PO TABS: 20.0000 mg | ORAL_TABLET | Freq: Every day | ORAL | 1 refills | Status: DC

## 2023-04-17 MED ORDER — GABAPENTIN 100 MG PO CAPS: 100.0000 mg | ORAL_CAPSULE | Freq: Every day | ORAL | 1 refills | Status: DC

## 2023-04-17 NOTE — Patient Instructions (Addendum)
Since improved we can continue same inhaler (that should be a med called symbicort that can be taken 2 puffs twice per day - call and let me know what the name is on the inhaler you are using daily). . Albuterol should only be used as a rescue inhaler for shortness of breath or wheezing IF the symbicort is not working. If shortness of breath does not continue to improve, I recommend follow up with lung specialist and your heart specialist.   I would like you to see eye doctor sooner than next month. Let me know who you are scheduled to see and I will see if we can have them see you sooner for the blurry vision and headaches to see if headache could be eye related.   Headache could also be related to neck pain, or pinched nerve - can try gabapentin 100mg  pill at bedtime. If you do not have side effects and headache continues, can increase to 2 pills at bedtime after 1 week. Let me know if this helps.  I will also refer you to ear specialist for the ear pains that also cause headache.   Recheck in 3 weeks.   Return to the clinic or go to the nearest emergency room if any of your symptoms worsen or new symptoms occur.
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
## 2023-04-18 ENCOUNTER — Telehealth: Payer: Self-pay | Admitting: Family Medicine

## 2023-04-18 ENCOUNTER — Encounter: Payer: Self-pay | Admitting: Family Medicine

## 2023-04-18 NOTE — Telephone Encounter (Signed)
Encourage patient to contact the pharmacy for refills or they can request refills through [facility]   WHAT PHARMACY WOULD THEY LIKE THIS SENT TO:  [facility] - [place], [place] - [address]    MEDICATION NAME & DOSE: budesonide-formoterol budesonide-formoterol (SYMBICORT) 80-4.5 MCG/ACT inhaler  NOTES/COMMENTS FROM PATIENT:      Front office please notify patient: It takes 48-72 hours to process rx refill requests Ask patient to call pharmacy to ensure rx is ready before heading there.

## 2023-04-18 NOTE — Telephone Encounter (Signed)
Caller name: Connor Hatter  On DPR?: Yes  Call back number: [phone]   Provider they see: Shade Flood, MD  Reason for call:   Pt has appt with Dr Charise Killian in [place], [place] 05/09/2023 Dr Nile Riggs retired.

## 2023-04-20 ENCOUNTER — Ambulatory Visit: Payer: Medicare Other | Admitting: Internal Medicine
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
## 2023-04-26 ENCOUNTER — Ambulatory Visit: Payer: Medicare Other | Attending: Cardiovascular Disease | Admitting: Cardiovascular Disease

## 2023-04-26 ENCOUNTER — Encounter: Payer: Self-pay | Admitting: Cardiovascular Disease

## 2023-04-26 VITALS — BP 120/78 | HR 68 | Resp 16 | Ht 67.0 in | Wt 172.0 lb

## 2023-04-26 DIAGNOSIS — I771 Stricture of artery: Secondary | ICD-10-CM

## 2023-04-26 DIAGNOSIS — G458 Other transient cerebral ischemic attacks and related syndromes: Secondary | ICD-10-CM

## 2023-04-26 DIAGNOSIS — I2510 Atherosclerotic heart disease of native coronary artery without angina pectoris: Secondary | ICD-10-CM | POA: Diagnosis not present

## 2023-04-26 NOTE — Patient Instructions (Signed)
Medication Instructions:   Your physician recommends that you continue on your current medications as directed. Please refer to the Current Medication list given to you today.  *If you need a refill on your cardiac medications before your next appointment, please call your pharmacy*   Testing/Procedures:  Your physician has requested that you have a carotid duplex. This test is an ultrasound of the carotid arteries in your neck. It looks at blood flow through these arteries that supply the brain with blood. Allow one hour for this exam. There are no restrictions or special instructions.  PATIENT CAN HAVE DONE AT [facility], [place], OR [facility] OFF[facility]    Follow-Up: At [facility], you and your health needs are our priority.  As part of our continuing mission to provide you with exceptional heart care, we have created designated Provider Care Teams.  These Care Teams include your primary Cardiologist (physician) and Advanced Practice Providers (APPs -  Physician Assistants and Nurse Practitioners) who all work together to provide you with the care you need, when you need it.  We recommend signing up for the patient portal called "MyChart".  Sign up information is provided on this After Visit Summary.  MyChart is used to connect with patients for Virtual Visits (Telemedicine).  Patients are able to view lab/test results, encounter notes, upcoming appointments, etc.  Non-urgent messages can be sent to your provider as well.   To learn more about what you can do with MyChart, go to [email or web address].    Your next appointment:   1 year(s)  Provider:   Charlton Haws, MD

## 2023-04-30 ENCOUNTER — Other Ambulatory Visit: Payer: Self-pay | Admitting: Family Medicine

## 2023-04-30 DIAGNOSIS — G4700 Insomnia, unspecified: Secondary | ICD-10-CM

## 2023-04-30 DIAGNOSIS — R4589 Other symptoms and signs involving emotional state: Secondary | ICD-10-CM

## 2023-05-08 ENCOUNTER — Encounter: Payer: Self-pay | Admitting: Family Medicine

## 2023-05-08 ENCOUNTER — Ambulatory Visit: Payer: Medicare Other | Admitting: Family Medicine

## 2023-05-08 VITALS — BP 108/60 | HR 67 | Temp 98.7°F | Ht 67.0 in | Wt 167.4 lb

## 2023-05-08 DIAGNOSIS — R058 Other specified cough: Secondary | ICD-10-CM | POA: Diagnosis not present

## 2023-05-08 DIAGNOSIS — R519 Headache, unspecified: Secondary | ICD-10-CM

## 2023-05-08 DIAGNOSIS — R059 Cough, unspecified: Secondary | ICD-10-CM | POA: Diagnosis not present

## 2023-05-08 MED ORDER — NITROGLYCERIN 0.4 MG SL SUBL: SUBLINGUAL_TABLET | SUBLINGUAL | 10 refills | Status: AC

## 2023-05-08 NOTE — Patient Instructions (Addendum)
You are taking famotidine AND nexium as they work differently for heartburn which has been thought to be a possible cause of your cough. Ok to continue both of those meds. You only need to take 1 nexium per day.   You are using the inhalers correctly - symbicort 2 times per day and albuterol only if needed.   If the headache returns, try taking the gabapentin in the morning to see if it is tolerated better. If the headaches return, let me know and I can see if a headache specialist can see you soon you sooner. If ear pain returns then I do recommend meeting with the ear specialist.
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
## 2023-05-09 ENCOUNTER — Ambulatory Visit: Payer: Medicare Other | Attending: Cardiology

## 2023-05-09 DIAGNOSIS — I771 Stricture of artery: Secondary | ICD-10-CM | POA: Diagnosis not present

## 2023-05-09 DIAGNOSIS — G458 Other transient cerebral ischemic attacks and related syndromes: Secondary | ICD-10-CM

## 2023-05-10 DIAGNOSIS — H04123 Dry eye syndrome of bilateral lacrimal glands: Secondary | ICD-10-CM | POA: Diagnosis not present

## 2023-05-18 ENCOUNTER — Telehealth: Payer: Self-pay | Admitting: Cardiovascular Disease

## 2023-05-18 DIAGNOSIS — I2510 Atherosclerotic heart disease of native coronary artery without angina pectoris: Secondary | ICD-10-CM

## 2023-05-18 DIAGNOSIS — I771 Stricture of artery: Secondary | ICD-10-CM

## 2023-05-18 NOTE — Telephone Encounter (Signed)
Called patient with his carotid results. Will place order for carotids in 2 years.

## 2023-05-18 NOTE — Telephone Encounter (Signed)
Patient is returning call. Please advise? 

## 2023-05-22 DIAGNOSIS — R3914 Feeling of incomplete bladder emptying: Secondary | ICD-10-CM | POA: Diagnosis not present

## 2023-05-22 DIAGNOSIS — N3020 Other chronic cystitis without hematuria: Secondary | ICD-10-CM | POA: Diagnosis not present

## 2023-05-27 ENCOUNTER — Other Ambulatory Visit: Payer: Self-pay | Admitting: Family Medicine

## 2023-07-10 ENCOUNTER — Ambulatory Visit: Payer: Medicare Other | Admitting: Cardiovascular Disease

## 2023-07-19 ENCOUNTER — Ambulatory Visit: Payer: Medicare Other | Admitting: Neurology

## 2023-07-31 ENCOUNTER — Ambulatory Visit: Payer: Self-pay | Admitting: Family Medicine

## 2023-07-31 NOTE — Telephone Encounter (Signed)
 Patient refused ED please advise I do not believe it is safe for patient to wait 7 days for treatment

## 2023-07-31 NOTE — Telephone Encounter (Signed)
 Copied from CRM [id number]. Topic: Clinical - Red Word Triage >> Jul 31, 2023  2:28 PM Suzette B wrote: [facility] that prompted transfer to Nurse Triage: has extremely bad cough started last night, sore throat, this morning body was really hot but not checked temperature. Reason for Disposition  SEVERE coughing spells (e.g., whooping sound after coughing, vomiting after coughing)  Answer Assessment - Initial Assessment Questions 1. ONSET: "When did the cough begin?"      Yesterday 2. SEVERITY: "How bad is the cough today?"      10/10 3. SPUTUM: "Describe the color of your sputum" (none, dry cough; clear, white, yellow, green)     None 4. HEMOPTYSIS: "Are you coughing up any blood?" If so ask: "How much?" (flecks, streaks, tablespoons, etc.)     None 5. DIFFICULTY BREATHING: "Are you having difficulty breathing?" If Yes, ask: "How bad is it?" (e.g., mild, moderate, severe)    - MILD: No SOB at rest, mild SOB with walking, speaks normally in sentences, can lie down, no retractions, pulse < 100.    - MODERATE: SOB at rest, SOB with minimal exertion and prefers to sit, cannot lie down flat, speaks in phrases, mild retractions, audible wheezing, pulse 100-120.    - SEVERE: Very SOB at rest, speaks in single words, struggling to breathe, sitting hunched forward, retractions, pulse > 120      A little bit at rest and up walking around 6. FEVER: "Do you have a fever?" If Yes, ask: "What is your temperature, how was it measured, and when did it start?"     No thermometer 7. CARDIAC HISTORY: "Do you have any history of heart disease?" (e.g., heart attack, congestive heart failure)      Yes 8. LUNG HISTORY: "Do you have any history of lung disease?"  (e.g., pulmonary embolus, asthma, emphysema)     Asthma 9. OTHER SYMPTOMS: "Do you have any other symptoms?" (e.g., runny nose, wheezing, chest pain)     Sore throat, chest pain w/coughing, feels tired, run down, laying on the couch  Protocols used: Cough -  Acute Productive-A-AH

## 2023-07-31 NOTE — Telephone Encounter (Signed)
 Chief Complaint: Cough Symptoms: tired, sore throat, chest pain w/coughing, mild SOB w/activity, body felt hot (no thermometer) Frequency: Onset yesterday Pertinent Negatives: Patient denies other symptoms Disposition: [] ED /[] Urgent Care (no appt availability in office) / [x] Appointment(In office/virtual)/ []  [facility]/ [] Home Care/ [x] Refused Recommended Disposition /[] [facility]/ []  Follow-up with PCP Additional Notes: Patient's wife called and says the patient was up all night coughing. She says he felt hot, but she doesn't have a thermometer. She asked if he was SOB, having chest pain and he said yes w/activity, chest pain w/coughing. She says she thinks he has the flu and would like Dr. Chilton Si to call him in some tamiflu. I asked was he exposed, she says she was around a lot of people this past weekend at a funeral, but not direct exposure to anyone that she knows of. She says her son and his wife has the flu, but they haven't been around them. Advised visit needed, she says a virtual could be done on her phone [phone]. No availability with Dr. Chilton Si. Patient says he doesn't want to see anyone else. Patient's wife advised I will send this note to the office for Dr. Neva Seat to review and someone will call with his recommendation, she verbalized understanding.

## 2023-07-31 NOTE — Telephone Encounter (Signed)
 5:33 PM Called Brandon [name].  Started getting sick 2 nights ago.  Cough, slight fever, some body aches.  Dtr had flu and her husband flu last week, but he has not been around them.  Wife at home with patient.  He notes he is having some shortness of breath at rest now.   I suspect he probably does have flu but I am concerned with the shortness of breath at rest.  Recommended he be seen at urgent care or ER tonight for evaluation, assessment of vital signs including oxygen and discussion of treatment plan.  He agrees to this plan and will be seen.

## 2023-08-07 ENCOUNTER — Ambulatory Visit: Payer: Medicare Other | Admitting: Family Medicine

## 2023-08-07 VITALS — BP 122/70 | HR 60 | Temp 98.5°F | Ht 67.0 in | Wt 169.0 lb

## 2023-08-07 DIAGNOSIS — R058 Other specified cough: Secondary | ICD-10-CM

## 2023-08-07 DIAGNOSIS — Z9861 Coronary angioplasty status: Secondary | ICD-10-CM

## 2023-08-07 DIAGNOSIS — E785 Hyperlipidemia, unspecified: Secondary | ICD-10-CM

## 2023-08-07 DIAGNOSIS — G4700 Insomnia, unspecified: Secondary | ICD-10-CM

## 2023-08-07 DIAGNOSIS — R519 Headache, unspecified: Secondary | ICD-10-CM

## 2023-08-07 DIAGNOSIS — I10 Essential (primary) hypertension: Secondary | ICD-10-CM

## 2023-08-07 DIAGNOSIS — I2510 Atherosclerotic heart disease of native coronary artery without angina pectoris: Secondary | ICD-10-CM

## 2023-08-07 LAB — COMPREHENSIVE METABOLIC PANEL
ALT: 21 U/L (ref 0–53)
AST: 25 U/L (ref 0–37)
Albumin: 4.2 g/dL (ref 3.5–5.2)
Alkaline Phosphatase: 80 U/L (ref 39–117)
BUN: 10 mg/dL (ref 6–23)
CO2: 26 meq/L (ref 19–32)
Calcium: 9.1 mg/dL (ref 8.4–10.5)
Chloride: 105 meq/L (ref 96–112)
Creatinine, Ser: 1 mg/dL (ref 0.40–1.50)
GFR: 71.6 mL/min (ref >60.00)
Glucose, Bld: 87 mg/dL (ref 70–99)
Potassium: 3.9 meq/L (ref 3.5–5.1)
Sodium: 139 meq/L (ref 135–145)
Total Bilirubin: 0.5 mg/dL (ref 0.2–1.2)
Total Protein: 7.4 g/dL (ref 6.0–8.3)

## 2023-08-07 LAB — LIPID PANEL
Cholesterol: 94 mg/dL (ref 0–200)
HDL: 30.8 mg/dL — ABNORMAL LOW (ref >39.00)
LDL Cholesterol: 37 mg/dL (ref 0–99)
NonHDL: 63.25
Total CHOL/HDL Ratio: 3
Triglycerides: 131 mg/dL (ref 0.0–149.0)
VLDL: 26.2 mg/dL (ref 0.0–40.0)

## 2023-08-07 MED ORDER — ESOMEPRAZOLE MAGNESIUM 20 MG PO CPDR: 20.0000 mg | DELAYED_RELEASE_CAPSULE | Freq: Every day | ORAL | 1 refills | Status: AC

## 2023-08-07 NOTE — Patient Instructions (Addendum)
 Try taking lower dose nexium 20mg  per day. Continue pepcid twice per day. If any worsening heartburn let me know. No other med changes at this time.  Take Care!
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
## 2023-08-08 ENCOUNTER — Encounter: Payer: Self-pay | Admitting: Family Medicine

## 2023-08-09 ENCOUNTER — Telehealth: Payer: Self-pay

## 2023-08-09 NOTE — Telephone Encounter (Signed)
-----   Message from Shade Flood sent at 08/08/2023  1:46 PM EDT ----- Call patient.  Cholesterol levels were stable.  Electrolytes, kidney, liver test looked okay.  Let me know if there are questions

## 2023-08-09 NOTE — Telephone Encounter (Signed)
 Lab results have been discussed.   Verbalized understanding? Yes  Are there any questions? No

## 2023-08-21 DIAGNOSIS — N3020 Other chronic cystitis without hematuria: Secondary | ICD-10-CM | POA: Diagnosis not present

## 2023-08-24 ENCOUNTER — Other Ambulatory Visit: Payer: Self-pay | Admitting: Family Medicine

## 2023-08-24 DIAGNOSIS — R4589 Other symptoms and signs involving emotional state: Secondary | ICD-10-CM

## 2023-08-24 DIAGNOSIS — G4700 Insomnia, unspecified: Secondary | ICD-10-CM

## 2023-08-24 NOTE — Telephone Encounter (Signed)
 Requested Prescriptions   Pending Prescriptions Disp Refills   traZODone (DESYREL) 50 MG tablet [Pharmacy Med Name: trazodone 50 mg tablet] 30 tablet 3    Sig: TAKE (1/2) TO (1) TABLET BY MOUTH AT BEDTIME AS NEEDED.     Date of patient request: 08/24/2023 Last office visit: 08/07/2023 Upcoming visit: 02/08/2024 Date of last refill: 05/01/2023 Last refill amount: 30x3

## 2023-10-23 ENCOUNTER — Other Ambulatory Visit: Payer: Self-pay | Admitting: Family Medicine

## 2023-10-23 DIAGNOSIS — R058 Other specified cough: Secondary | ICD-10-CM

## 2023-10-23 DIAGNOSIS — R059 Cough, unspecified: Secondary | ICD-10-CM

## 2023-11-07 ENCOUNTER — Telehealth: Payer: Self-pay | Admitting: Neurology

## 2023-11-07 ENCOUNTER — Ambulatory Visit: Payer: Medicare Other | Admitting: Neurology

## 2023-11-07 ENCOUNTER — Encounter: Payer: Self-pay | Admitting: Neurology

## 2023-11-07 VITALS — BP 123/70 | HR 63 | Ht 68.0 in | Wt 173.0 lb

## 2023-11-07 DIAGNOSIS — M5382 Other specified dorsopathies, cervical region: Secondary | ICD-10-CM

## 2023-11-07 DIAGNOSIS — M2480 Other specific joint derangements of unspecified joint, not elsewhere classified: Secondary | ICD-10-CM

## 2023-11-07 DIAGNOSIS — M5412 Radiculopathy, cervical region: Secondary | ICD-10-CM

## 2023-11-07 DIAGNOSIS — M5481 Occipital neuralgia: Secondary | ICD-10-CM

## 2023-11-07 DIAGNOSIS — Z87828 Personal history of other (healed) physical injury and trauma: Secondary | ICD-10-CM | POA: Diagnosis not present

## 2023-11-07 DIAGNOSIS — M436 Torticollis: Secondary | ICD-10-CM

## 2023-11-07 DIAGNOSIS — M542 Cervicalgia: Secondary | ICD-10-CM | POA: Diagnosis not present

## 2023-11-07 DIAGNOSIS — G8929 Other chronic pain: Secondary | ICD-10-CM

## 2023-11-07 MED ORDER — PREGABALIN 25 MG PO CAPS: 25.0000 mg | ORAL_CAPSULE | Freq: Two times a day (BID) | ORAL | 6 refills | Status: AC

## 2023-11-07 NOTE — Patient Instructions (Addendum)
 MRI cervical spine Physical Therapy at [place] for the neck pain   Pain in the back of the head due to neck/arthritis in the neck possibly Ice/heat Topical gels (Voltaren, Icy hot) Physical Therapy: Lives in [place] [facility]) Gentle massage/dry needling   Cervicalgia, also known as neck pain, is a general term for pain or discomfort in the neck region, specifically the cervical spine. It can be caused by various factors, including muscle strain, poor posture, injuries, or underlying medical conditions.   Symptoms: Localized pain in the neck. Stiffness and limited mobility in the neck. Aching or burning sensation in the neck. Sharp, stabbing pain, especially with movement. Tension or stiffness in the muscles and joints of the upper back and neck. Headaches, dizziness, or nausea. Radiating pain into the shoulders, upper back, or arms.  Causes: Muscle strain or sprain. Poor posture. Injuries, such as whiplash or sprains. Underlying medical conditions, such as arthritis, herniated discs, or cervical spondylosis. Stress. Carrying heavy loads or repetitive motions.  Treatment: Rest and gentle stretching. Over-the-counter pain relievers. Heat or ice applications. Pregabalin Capsules What is this medication? PREGABALIN (pre GAB a lin) treats nerve pain. It may also be used to prevent and control seizures in people with epilepsy. It works by calming overactive nerves in your body. This medicine may be used for other purposes; ask your health care provider or pharmacist if you have questions. COMMON BRAND NAME(S): Lyrica What should I tell my care team before I take this medication? They need to know if you have any of these conditions: Heart failure Kidney disease Lung disease Substance use disorder Suicidal thoughts, plans or attempt by you or a family member An unusual or allergic reaction to pregabalin, other medications, foods, dyes, or preservatives Pregnant or trying to  get pregnant Breast-feeding How should I use this medication? Take this medication by mouth with water. Take it as directed on the prescription label at the same time every day. You can take it with or without food. If it upsets your stomach, take it with food. Keep taking it unless your care team tells you to stop. A special MedGuide will be given to you by the pharmacist with each prescription and refill. Be sure to read this information carefully each time. Talk to your care team about the use of this medication in children. While it may be prescribed for children as young as 1 month for selected conditions, precautions do apply. Overdosage: If you think you have taken too much of this medicine contact a poison control center or emergency room at once. NOTE: This medicine is only for you. Do not share this medicine with others. What if I miss a dose? If you miss a dose, take it as soon as you can. If it is almost time for your next dose, take only that dose. Do not take double or extra doses. What may interact with this medication? This medication may interact with the following: Alcohol Antihistamines for allergy, cough, and cold Certain medications for anxiety or sleep Certain medications for blood pressure, heart disease Certain medications for depression like amitriptyline, fluoxetine, sertraline Certain medications for diabetes, like pioglitazone, rosiglitazone Certain medications for seizures like phenobarbital, primidone General anesthetics like halothane, isoflurane, methoxyflurane, propofol  Medications that relax muscles for surgery Opioid medications for pain Phenothiazines like chlorpromazine, mesoridazine, prochlorperazine, thioridazine This list may not describe all possible interactions. Give your health care provider a list of all the medicines, herbs, non-prescription drugs, or dietary supplements you use. Also  tell them if you smoke, drink alcohol, or use illegal drugs.  Some items may interact with your medicine. What should I watch for while using this medication? Visit your care team for regular checks on your progress. Tell your care team if your symptoms do not start to get better or if they get worse. Do not suddenly stop taking this medication. You may develop a severe reaction. Your care team will tell you how much medication to take. If your care team wants you to stop the medication, the dose may be slowly lowered over time to avoid any side effects. This medication may affect your coordination, reaction time, or judgment. Do not drive or operate machinery until you know how this medication affects you. Sit up or stand slowly to reduce the risk of dizzy or fainting spells. Drinking alcohol with this medication can increase the risk of these side effects. If you or your family notice any changes in your behavior, such as new or worsening depression, thoughts of harming yourself, anxiety, other unusual or disturbing thoughts, or memory loss, call your care team right away. Wear a medical ID bracelet or chain if you are taking this medication for seizures. Carry a card that describes your condition. List the medications and doses you take on the card. This medication may make it more difficult to father a child. Talk to your care team if you are concerned about your fertility. What side effects may I notice from receiving this medication? Side effects that you should report to your care team as soon as possible: Allergic reactions or angioedema--skin rash, itching, hives, swelling of the face, eyes, lips, tongue, arms, or legs, trouble swallowing or breathing Blurry vision Thoughts of suicide or self-harm, worsening mood, feelings of depression Trouble breathing Side effects that usually do not require medical attention (report to your care team if they continue or are bothersome): Dizziness Drowsiness Dry mouth Nausea Swelling of the ankles, feet,  hands Vomiting Weight gain This list may not describe all possible side effects. Call your doctor for medical advice about side effects. You may report side effects to FDA at 1-800-FDA-1088. Where should I keep my medication? Keep out of the reach of children and pets. This medication can be abused. Keep it in a safe place to protect it from theft. Do not share it with anyone. It is only for you. Selling or giving away this medication is dangerous and against the law. Store at [employer] C (77 degrees F). Get rid of any unused medication after the expiration date. This medication may cause harm and death if it is taken by other adults, children, or pets. It is important to get rid of the medication as soon as you no longer need it, or it is expired. You can do this in two ways: Take the medication to a medication take-back program. Check with your pharmacy or law enforcement to find a location. If you cannot return the medication, check the label or package insert to see if the medication should be thrown out in the garbage or flushed down the toilet. If you are not sure, ask your care team. If it is safe to put it in the trash, take the medication out of the container. Mix the medication with cat litter, dirt, coffee grounds, or other unwanted substance. Seal the mixture in a bag or container. Put it in the trash. NOTE: This sheet is a summary. It may not cover all possible information. If you have questions  about this medicine, talk to your doctor, pharmacist, or health care provider.  2024 Elsevier/Gold Standard (2021-02-16 00:00:00)

## 2023-11-07 NOTE — Telephone Encounter (Signed)
Referral for physical therapy fax to [facility].Phone: [phone], Fax: [phone]
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
## 2023-11-08 ENCOUNTER — Telehealth: Payer: Self-pay | Admitting: Neurology

## 2023-11-08 NOTE — Telephone Encounter (Signed)
 [employer] medicare [employer]: [id number] exp. 11/08/23-12/07/23 sent to GI [phone]

## 2023-11-24 ENCOUNTER — Ambulatory Visit
Admission: RE | Admit: 2023-11-24 | Discharge: 2023-11-24 | Disposition: A | Discharge status: Home / Self Care | Source: Ambulatory Visit | Attending: Neurology

## 2023-11-24 DIAGNOSIS — Z87828 Personal history of other (healed) physical injury and trauma: Secondary | ICD-10-CM

## 2023-11-24 DIAGNOSIS — M542 Cervicalgia: Secondary | ICD-10-CM

## 2023-11-24 DIAGNOSIS — M436 Torticollis: Secondary | ICD-10-CM

## 2023-11-24 DIAGNOSIS — M2480 Other specific joint derangements of unspecified joint, not elsewhere classified: Secondary | ICD-10-CM | POA: Diagnosis not present

## 2023-11-24 DIAGNOSIS — M5382 Other specified dorsopathies, cervical region: Secondary | ICD-10-CM | POA: Diagnosis not present

## 2023-11-24 DIAGNOSIS — M5412 Radiculopathy, cervical region: Secondary | ICD-10-CM | POA: Diagnosis not present

## 2023-11-28 ENCOUNTER — Ambulatory Visit

## 2023-11-28 VITALS — BP 123/71 | HR 78 | Ht 68.0 in | Wt 168.0 lb

## 2023-11-28 DIAGNOSIS — I10 Essential (primary) hypertension: Secondary | ICD-10-CM | POA: Diagnosis not present

## 2023-11-28 DIAGNOSIS — J410 Simple chronic bronchitis: Secondary | ICD-10-CM

## 2023-11-28 DIAGNOSIS — E785 Hyperlipidemia, unspecified: Secondary | ICD-10-CM | POA: Diagnosis not present

## 2023-11-28 DIAGNOSIS — G4700 Insomnia, unspecified: Secondary | ICD-10-CM | POA: Diagnosis not present

## 2023-11-28 DIAGNOSIS — Z7689 Persons encountering health services in other specified circumstances: Secondary | ICD-10-CM
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
## 2023-11-29 ENCOUNTER — Ambulatory Visit: Payer: Self-pay | Admitting: Neurology

## 2023-11-29 ENCOUNTER — Telehealth: Payer: Self-pay | Admitting: Neurology

## 2023-11-29 DIAGNOSIS — G8929 Other chronic pain: Secondary | ICD-10-CM

## 2023-11-29 DIAGNOSIS — M5030 Other cervical disc degeneration, unspecified cervical region: Secondary | ICD-10-CM

## 2023-11-29 DIAGNOSIS — M47812 Spondylosis without myelopathy or radiculopathy, cervical region: Secondary | ICD-10-CM

## 2023-11-29 NOTE — Telephone Encounter (Signed)
 Patient has arthritis in the neck. The spinal cord looks normal and I don't see any pinched nerves but he has arthritic changes that could be addressed by injections. If he is willing, we can send him to someone who can evaluate him for injections into the cervical spine to help with pain and cervicogenic headache please let me know and I can send to dr bonner or see if anyone is near [place] where he lives thanks see phone note

## 2023-11-29 NOTE — Addendum Note (Signed)
 Addended by: HILLIARD HEATHER CROME on: 11/29/2023 04:45 PM   Modules accepted: Orders

## 2023-11-29 NOTE — Telephone Encounter (Signed)
-----   Message from Onetha KATHEE Epp sent at 11/29/2023  3:58 PM EDT ----- Patient has arthritis in the neck. The spinal cord looks normal and I don't see any pinched nerves but he has arthritic changes that could be addressed by injections. If he is willing, we can send  him to someone who can evaluate him for injections into the cervical spine to help with pain and cervicogenic headache please let me know and I can send to dr bonner or see if anyone is near [place]  where he lives thanks see phone note ----- Message ----- From: Rosemarie Eather RAMAN, MD Sent: 11/25/2023  12:53 PM EDT To: Onetha KATHEE Epp, MD

## 2023-11-29 NOTE — Telephone Encounter (Signed)
 Spoke with patient and discussed results from Dr Ines. Patient agrees to being evaluated for injections. He would like [place] location. We can refer to [facility] in [place].

## 2023-11-30 NOTE — Telephone Encounter (Signed)
-----   Message from Onetha KATHEE Epp sent at 11/29/2023  3:58 PM EDT ----- Patient has arthritis in the neck. The spinal cord looks normal and I don't see any pinched nerves but he has arthritic changes that could be addressed by injections. If he is willing, we can send  him to someone who can evaluate him for injections into the cervical spine to help with pain and cervicogenic headache please let me know and I can send to dr bonner or see if anyone is near [place]  where he lives thanks see phone note ----- Message ----- From: Rosemarie Eather RAMAN, MD Sent: 11/25/2023  12:53 PM EDT To: Onetha KATHEE Epp, MD

## 2023-11-30 NOTE — Telephone Encounter (Signed)
 Spoke to pt and relayed the MRI results.  He verbalized understanding and would like to proceed with Eval thru Dr Bonner in [place]. He appreciated call back.

## 2023-11-30 NOTE — Addendum Note (Signed)
 Addended by: NEYSA NENA RAMAN on: 11/30/2023 10:24 AM   Modules accepted: Orders

## 2023-12-03 DIAGNOSIS — J410 Simple chronic bronchitis: Secondary | ICD-10-CM | POA: Insufficient documentation

## 2023-12-03 DIAGNOSIS — G4700 Insomnia, unspecified: Secondary | ICD-10-CM | POA: Insufficient documentation

## 2023-12-03 NOTE — Assessment & Plan Note (Signed)
 Stable with current dose of trazodone .  He denies needing refills at this time.

## 2023-12-03 NOTE — Assessment & Plan Note (Signed)
 Stable at this time.  He denies any cough, shortness of breath or wheezing.  He has albuterol  HFA for as needed use.  He denies needing refills at this time.

## 2023-12-03 NOTE — Assessment & Plan Note (Signed)
 Stable according to most recent labs 3 months ago.  Continue with atorvastatin  80 mg.  No medication changes at this time.  No refills needed.

## 2023-12-03 NOTE — Assessment & Plan Note (Signed)
 Currently stable.  He denies any symptoms of low blood pressure, chest pain, dizziness or shortness of breath.  No refills needed at this time.

## 2023-12-05 NOTE — Addendum Note (Signed)
 Addended by: Makinze Jani B on: 12/05/2023 05:29 PM   Modules accepted: Orders

## 2023-12-06 ENCOUNTER — Telehealth: Payer: Self-pay | Admitting: Neurology

## 2023-12-06 NOTE — Telephone Encounter (Signed)
 Referral for orthopedics fax to [facility] to see Dr. Bonner. Phone: [phone], [phone]

## 2023-12-08 DIAGNOSIS — M542 Cervicalgia: Secondary | ICD-10-CM | POA: Diagnosis not present

## 2023-12-18 ENCOUNTER — Ambulatory Visit: Payer: Self-pay

## 2023-12-18 NOTE — Telephone Encounter (Signed)
 FYI Only or Action Required?: Action required by provider: request for appointment.  Patient was last seen in primary care on 11/28/2023 by Bevely Doffing, FNP.  Called Nurse Triage reporting Dizziness.  Symptoms began several days ago.  Interventions attempted: Prescription medications: norvasc .  Symptoms are: unchanged.  Triage Disposition: Go to ED Now (or PCP Triage)  Patient/caregiver understands and will follow disposition?: No, wishes to speak with PCP  Pt states his BP was 150/80's and after taking BP medication is not 109/53 HR 66     Copied from CRM [id number]. Topic: Clinical - Red Word Triage >> Dec 18, 2023 10:53 AM Brandon [name] wrote: Red Word that prompted transfer to Nurse Triage: 2-3 days of lightheadedness and dizziness  BP 150/80 took 2 BP pills to bring it down 114/55 Reason for Disposition  [1] Dizziness (vertigo) present now AND [2] age > 67  (Exception: Prior doctor or NP/PA evaluation for this AND no different/worse than usual.)  Answer Assessment - Initial Assessment Questions 1. DESCRIPTION: Describe your dizziness.     Swimming headed 2. VERTIGO: Do you feel like either you or the room is spinning or tilting?     Head spinning and drunk feeling 3. LIGHTHEADED: Do you feel lightheaded? (e.g., somewhat faint, woozy, weak upon standing)     no 4. SEVERITY: How bad is it?  Can you walk?     Can walk  5. ONSET:  When did the dizziness begin?      3-4 days ago  6. AGGRAVATING FACTORS: Does anything make it worse? (e.g., standing, change in head position)     Changing positions 7. CAUSE: What do you think is causing the dizziness?      unknown 8. RECURRENT SYMPTOM: Have you had dizziness before? If Yes, ask: When was the last time? What happened that time?     2 months ago  9. OTHER SYMPTOMS: Do you have any other symptoms? (e.g., earache, headache, numbness, tinnitus, vomiting, weakness)     Earache, headache, ringing in ears BP was  150/80's  after medication now reading 109/53 HR 66  Protocols used: Dizziness - Vertigo-A-AH

## 2023-12-18 NOTE — Telephone Encounter (Signed)
Patient is already scheduled for next available

## 2023-12-20 ENCOUNTER — Ambulatory Visit (INDEPENDENT_AMBULATORY_CARE_PROVIDER_SITE_OTHER): Payer: Self-pay

## 2023-12-20 VITALS — BP 121/73 | HR 68 | Ht 68.0 in | Wt 175.0 lb

## 2023-12-20 DIAGNOSIS — R5383 Other fatigue: Secondary | ICD-10-CM

## 2023-12-20 DIAGNOSIS — R42 Dizziness and giddiness: Secondary | ICD-10-CM

## 2023-12-20 MED ORDER — CYANOCOBALAMIN 1000 MCG/ML IJ SOLN
1000.0000 ug | Freq: Once | INTRAMUSCULAR | Status: AC
  Administered 2023-12-20: 1000 ug via INTRAMUSCULAR

## 2023-12-20 MED ORDER — MECLIZINE HCL 25 MG PO TABS: 25.0000 mg | ORAL_TABLET | Freq: Three times a day (TID) | ORAL | 3 refills | Status: AC | PRN

## 2023-12-20 NOTE — Progress Notes (Signed)
 Established Patient Office Visit  Subjective   Patient ID: Brandon [name], male    DOB: 12-Aug-1945  Age: 78 y.o. MRN: [id number]  Chief Complaint  Patient presents with   Medical Management of Chronic Issues    Pt states ear discomfort and dizziness also states swimmy headed     HPI  Patient Active Problem List   Diagnosis Date Noted   Vertigo 12/29/2023   Simple chronic bronchitis (HCC) 12/03/2023   Insomnia 12/03/2023   Chronic UTI (urinary tract infection) 09/09/2022   Upper airway cough syndrome 02/21/2022   S/P ORIF (open reduction internal fixation) fracture 03/11/2021   GERD (gastroesophageal reflux disease) 01/05/2021   Compression fracture of thoracic vertebra with routine healing 09/11/2020   Biceps tendinitis of right upper extremity 08/25/2020   Closed displaced transverse fracture of shaft of right radius with routine healing 08/25/2020   Neuritis of right ulnar nerve 08/25/2020   Chronic pain of left knee 03/25/2020   Bilateral hearing loss 08/13/2019   Nocturia 08/13/2019   Hyperlipidemia 04/14/2014   CAD S/P percutaneous coronary angioplasty: DES PCI to mLAD & OM1 04/03/2014    Class: Status post   Right carotid bruit 11/14/2012   HTN (hypertension) 08/02/2012   Fatigue 06/21/2012   DOE (dyspnea on exertion) 06/21/2012      ROS    Objective:     BP 121/73   Pulse 68   Ht 5' 8 (1.727 m)   Wt 175 lb (79.4 kg)   SpO2 94%   BMI 26.61 kg/m  BP Readings from Last 3 Encounters:  12/20/23 121/73  11/28/23 123/71  11/07/23 123/70   Wt Readings from Last 3 Encounters:  12/20/23 175 lb (79.4 kg)  11/28/23 168 lb (76.2 kg)  11/07/23 173 lb (78.5 kg)      Physical Exam Vitals and nursing note reviewed.  Constitutional:      Appearance: Normal appearance.  HENT:     Head: Normocephalic.  Eyes:     Extraocular Movements: Extraocular movements intact.     Pupils: Pupils are equal, round, and reactive to light.  Cardiovascular:     Rate  and Rhythm: Normal rate and regular rhythm.  Pulmonary:     Effort: Pulmonary effort is normal.     Breath sounds: Normal breath sounds.  Musculoskeletal:     Cervical back: Normal range of motion and neck supple.  Neurological:     General: No focal deficit present.     Mental Status: He is alert and oriented to person, place, and time.     Cranial Nerves: No cranial nerve deficit.     Motor: No weakness.     Gait: Gait normal.  Psychiatric:        Mood and Affect: Mood normal.        Thought Content: Thought content normal.     No results found for any visits on 12/20/23.    The ASCVD Risk score (Arnett DK, et al., 2019) failed to calculate for the following reasons:   The valid total cholesterol range is 130 to 320 mg/dL    Assessment & Plan:   Problem List Items Addressed This Visit       Other   Fatigue - Primary   Unremarkable exam.  He reports receiving B12 injections in the past, but has not received one in over 6 months.  B12 administered in office today to see if any improvement in fatigue.        Vertigo  Unremarkable neurological exam.  Will add meclizine  for as needed use.      Relevant Medications   meclizine  (ANTIVERT ) 25 MG tablet    No follow-ups on file.    Leita Longs, FNP

## 2023-12-26 ENCOUNTER — Encounter: Payer: Self-pay | Admitting: Physical Medicine and Rehabilitation

## 2023-12-26 ENCOUNTER — Ambulatory Visit: Admitting: Physical Medicine and Rehabilitation

## 2023-12-26 DIAGNOSIS — M542 Cervicalgia: Secondary | ICD-10-CM | POA: Diagnosis not present

## 2023-12-26 DIAGNOSIS — M47817 Spondylosis without myelopathy or radiculopathy, lumbosacral region: Secondary | ICD-10-CM | POA: Diagnosis not present

## 2023-12-26 DIAGNOSIS — M47812 Spondylosis without myelopathy or radiculopathy, cervical region: Secondary | ICD-10-CM | POA: Diagnosis not present
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
## 2023-12-26 NOTE — Progress Notes (Unsigned)
 Pain Scale   Average Pain 3 Patient advising he has neck pain that radiates bilaterally to shoulders. Patient has MRI results in chart       +Driver, -BT, -Dye Allergies.

## 2023-12-29 DIAGNOSIS — R42 Dizziness and giddiness: Secondary | ICD-10-CM | POA: Insufficient documentation

## 2023-12-29 NOTE — Assessment & Plan Note (Signed)
 Unremarkable exam.  He reports receiving B12 injections in the past, but has not received one in over 6 months.  B12 administered in office today to see if any improvement in fatigue.

## 2023-12-29 NOTE — Assessment & Plan Note (Signed)
 Unremarkable neurological exam.  Will add meclizine  for as needed use.

## 2024-01-05 ENCOUNTER — Telehealth: Payer: Self-pay

## 2024-01-05 DIAGNOSIS — G4700 Insomnia, unspecified: Secondary | ICD-10-CM

## 2024-01-05 DIAGNOSIS — R4589 Other symptoms and signs involving emotional state: Secondary | ICD-10-CM

## 2024-01-05 NOTE — Telephone Encounter (Signed)
 Copied from CRM [id number]. Topic: Clinical - Medication Refill >> Jan 05, 2024 12:37 PM Santiya F wrote: Medication: traZODone  (DESYREL ) 50 MG tablet [[id number]]  Has the patient contacted their pharmacy? Yes  (Agent: If yes, when and what did the pharmacy advise?) Contact office   This is the patient's preferred pharmacy:  [facility] - [place], [place] - [address] [place] [address] Phone: [phone] Fax: [phone]  Is this the correct pharmacy for this prescription? Yes If no, delete pharmacy and type the correct one.   Has the prescription been filled recently? Yes  Is the patient out of the medication? Yes  Has the patient been seen for an appointment in the last year OR does the patient have an upcoming appointment? Yes  Can we respond through MyChart? No  Agent: Please be advised that Rx refills may take up to 3 business days. We ask that you follow-up with your pharmacy.   Patient is requesting his pcp refill this medication. It was originally prescribed by a different provider who he no longer sees. Please follow up with patient's wife Cathlean.

## 2024-01-05 NOTE — Telephone Encounter (Signed)
 Requested Prescriptions   Pending Prescriptions Disp Refills   traZODone  (DESYREL ) 50 MG tablet 30 tablet 3    Sig: TAKE (1/2) TO (1) TABLET BY MOUTH AT BEDTIME AS NEEDED.     Date of patient request: 01/05/2024 Last office visit: 12/20/2023 Upcoming visit: 02/29/2024 Date of last refill: 08/24/2023 Last refill amount: 30 3 refills

## 2024-01-08 MED ORDER — TRAZODONE HCL 50 MG PO TABS: ORAL_TABLET | ORAL | 3 refills | Status: DC

## 2024-01-22 ENCOUNTER — Ambulatory Visit (INDEPENDENT_AMBULATORY_CARE_PROVIDER_SITE_OTHER)

## 2024-01-22 DIAGNOSIS — E538 Deficiency of other specified B group vitamins: Secondary | ICD-10-CM

## 2024-01-22 MED ORDER — CYANOCOBALAMIN 1000 MCG/ML IJ SOLN
1000.0000 ug | Freq: Once | INTRAMUSCULAR | Status: AC
  Administered 2024-01-22: 1000 ug via INTRAMUSCULAR

## 2024-01-22 NOTE — Progress Notes (Signed)
 Patient is in office today for a nurse visit for B12 Injection. Patient Injection was given in the  Right arm. Patient tolerated injection well.

## 2024-02-08 ENCOUNTER — Encounter: Admitting: Family Medicine

## 2024-02-23 ENCOUNTER — Ambulatory Visit (INDEPENDENT_AMBULATORY_CARE_PROVIDER_SITE_OTHER)

## 2024-02-23 DIAGNOSIS — E538 Deficiency of other specified B group vitamins: Secondary | ICD-10-CM | POA: Diagnosis not present

## 2024-02-23 MED ORDER — CYANOCOBALAMIN 1000 MCG/ML IJ SOLN
1000.0000 ug | Freq: Once | INTRAMUSCULAR | Status: AC
  Administered 2024-02-23: 1000 ug via INTRAMUSCULAR

## 2024-02-23 NOTE — Progress Notes (Signed)
 Patient is in office today for a nurse visit for B12 Injection. Patient Injection was given in the  Left deltoid. Patient tolerated injection well.

## 2024-02-29 ENCOUNTER — Ambulatory Visit

## 2024-03-04 ENCOUNTER — Telehealth: Payer: Self-pay

## 2024-03-04 NOTE — Telephone Encounter (Signed)
 Called patient to reschedule his appointment with Dr Ines on 10/29. Offered him an appointment with Dr Margaret on December 3. Patient accepted the appointment and then asked if Dr Margaret was [place]. I advised that he was but he was [place] in [place]  and spoke perfect english. The patient was adamant that he wanted an [place] doctor so I placed him on mute to look for other appointments. While on mute the patient made an extremely racist comment to his wife. I picked up the call and advised him that I did not have any other appointments to offer him and that if he was not wanting to see Dr Margaret he could reach out to his PCP for a referral elsewhere. Patient was agreeable to that.

## 2024-03-12 ENCOUNTER — Ambulatory Visit (INDEPENDENT_AMBULATORY_CARE_PROVIDER_SITE_OTHER)

## 2024-03-12 VITALS — BP 127/77 | HR 70 | Ht 67.0 in | Wt 170.1 lb

## 2024-03-12 DIAGNOSIS — E785 Hyperlipidemia, unspecified: Secondary | ICD-10-CM

## 2024-03-12 DIAGNOSIS — I10 Essential (primary) hypertension: Secondary | ICD-10-CM

## 2024-03-12 MED ORDER — AMLODIPINE BESYLATE 2.5 MG PO TABS: 2.5000 mg | ORAL_TABLET | Freq: Every day | ORAL | 3 refills | Status: AC

## 2024-03-12 MED ORDER — ATORVASTATIN CALCIUM 80 MG PO TABS: 80.0000 mg | ORAL_TABLET | Freq: Every day | ORAL | 3 refills | Status: AC

## 2024-03-12 MED ORDER — PROMETHAZINE HCL 25 MG PO TABS: 25.0000 mg | ORAL_TABLET | Freq: Three times a day (TID) | ORAL | 2 refills | Status: AC | PRN

## 2024-03-12 NOTE — Progress Notes (Signed)
 Established Patient Office Visit  Subjective   Patient ID: SERAFIN DECATUR, male    DOB: Jul 18, 1945  Age: 78 y.o. MRN: [id number]  Chief Complaint  Patient presents with   Medical Management of Chronic Issues    3 month follow up    HPI  Patient Active Problem List   Diagnosis Date Noted   Vertigo 12/29/2023   Insomnia 12/03/2023   Chronic UTI (urinary tract infection) 09/09/2022   Upper airway cough syndrome 02/21/2022   S/P ORIF (open reduction internal fixation) fracture 03/11/2021   GERD (gastroesophageal reflux disease) 01/05/2021   Compression fracture of thoracic vertebra with routine healing 09/11/2020   Biceps tendinitis of right upper extremity 08/25/2020   Closed displaced transverse fracture of shaft of right radius with routine healing 08/25/2020   Neuritis of right ulnar nerve 08/25/2020   Chronic pain of left knee 03/25/2020   Bilateral hearing loss 08/13/2019   Nocturia 08/13/2019   Hyperlipidemia 04/14/2014   CAD S/P percutaneous coronary angioplasty: DES PCI to mLAD & OM1 04/03/2014    Class: Status post   Right carotid bruit 11/14/2012   HTN (hypertension) 08/02/2012   Fatigue 06/21/2012   DOE (dyspnea on exertion) 06/21/2012    ROS    Objective:     BP 127/77   Pulse 70   Ht 5' 7 (1.702 m)   Wt 170 lb 1.3 oz (77.1 kg)   SpO2 94%   BMI 26.64 kg/m  BP Readings from Last 3 Encounters:  03/12/24 127/77  12/20/23 121/73  11/28/23 123/71   Wt Readings from Last 3 Encounters:  03/12/24 170 lb 1.3 oz (77.1 kg)  12/20/23 175 lb (79.4 kg)  11/28/23 168 lb (76.2 kg)      Physical Exam Vitals and nursing note reviewed.  Constitutional:      Appearance: Normal appearance.  HENT:     Head: Normocephalic.  Eyes:     Extraocular Movements: Extraocular movements intact.     Pupils: Pupils are equal, round, and reactive to light.  Cardiovascular:     Rate and Rhythm: Normal rate and regular rhythm.  Pulmonary:     Effort: Pulmonary  effort is normal.     Breath sounds: Normal breath sounds.  Musculoskeletal:     Cervical back: Normal range of motion and neck supple.  Neurological:     Mental Status: He is alert and oriented to person, place, and time.  Psychiatric:        Mood and Affect: Mood normal.        Thought Content: Thought content normal.     No results found for any visits on 03/12/24.  Last CBC Lab Results  Component Value Date   WBC 7.5 03/13/2023   HGB 13.7 03/13/2023   HCT 42.3 03/13/2023   MCV 91.3 03/13/2023   MCH 29.5 07/17/2020   RDW 13.5 03/13/2023   PLT 230.0 03/13/2023   Last metabolic panel Lab Results  Component Value Date   GLUCOSE 87 08/07/2023   NA 139 08/07/2023   K 3.9 08/07/2023   CL 105 08/07/2023   CO2 26 08/07/2023   BUN 10 08/07/2023   CREATININE 1.00 08/07/2023   GFR 71.60 08/07/2023   CALCIUM  9.1 08/07/2023   PROT 7.4 08/07/2023   ALBUMIN 4.2 08/07/2023   BILITOT 0.5 08/07/2023   ALKPHOS 80 08/07/2023   AST 25 08/07/2023   ALT 21 08/07/2023   ANIONGAP 7 03/04/2020   Last lipids Lab Results  Component Value Date  CHOL 94 08/07/2023   HDL 30.80 (L) 08/07/2023   LDLCALC 37 08/07/2023   TRIG 131.0 08/07/2023   CHOLHDL 3 08/07/2023   Last hemoglobin A1c Lab Results  Component Value Date   HGBA1C 6.0 02/03/2017   Last thyroid  functions Lab Results  Component Value Date   TSH 3.17 09/09/2022     The ASCVD Risk score (Arnett DK, et al., 2019) failed to calculate for the following reasons:   The 2019 ASCVD risk score is only valid for ages 36 to 4    Assessment & Plan:   Problem List Items Addressed This Visit       Cardiovascular and Mediastinum   HTN (hypertension) - Primary   Currently stable.  No medication changes made at this time.  Refills provided      Relevant Medications   amLODipine  (NORVASC ) 2.5 MG tablet   atorvastatin  (LIPITOR ) 80 MG tablet     Other   Hyperlipidemia   Stable according to most recent labs 3 months  ago.  Continue with atorvastatin  80 mg.  No medication changes at this time.  Atorvastatin  80 mg refilled.      Relevant Medications   amLODipine  (NORVASC ) 2.5 MG tablet   atorvastatin  (LIPITOR ) 80 MG tablet    Return in about 6 months (around 09/10/2024) for chronic follow-up with PCP.    Leita Longs, FNP

## 2024-03-24 NOTE — Assessment & Plan Note (Addendum)
 Currently stable.  No medication changes made at this time.  Refills provided

## 2024-03-24 NOTE — Assessment & Plan Note (Signed)
 Stable according to most recent labs 3 months ago.  Continue with atorvastatin  80 mg.  No medication changes at this time.  Atorvastatin  80 mg refilled.

## 2024-03-25 ENCOUNTER — Ambulatory Visit (INDEPENDENT_AMBULATORY_CARE_PROVIDER_SITE_OTHER)

## 2024-03-25 DIAGNOSIS — E538 Deficiency of other specified B group vitamins: Secondary | ICD-10-CM | POA: Diagnosis not present

## 2024-03-25 MED ORDER — CYANOCOBALAMIN 1000 MCG/ML IJ SOLN
1000.0000 ug | Freq: Once | INTRAMUSCULAR | Status: AC
  Administered 2024-03-25: 1000 ug via INTRAMUSCULAR

## 2024-03-25 NOTE — Progress Notes (Signed)
 Patient is in office today for a nurse visit for B12 Injection. Patient Injection was given in the  Right deltoid. Patient tolerated injection well.

## 2024-03-27 ENCOUNTER — Ambulatory Visit: Admitting: Neurology

## 2024-04-24 ENCOUNTER — Ambulatory Visit

## 2024-04-29 ENCOUNTER — Ambulatory Visit (INDEPENDENT_AMBULATORY_CARE_PROVIDER_SITE_OTHER)

## 2024-04-29 ENCOUNTER — Telehealth: Payer: Self-pay | Admitting: Neurology

## 2024-04-29 ENCOUNTER — Other Ambulatory Visit: Payer: Self-pay

## 2024-04-29 DIAGNOSIS — G4700 Insomnia, unspecified: Secondary | ICD-10-CM

## 2024-04-29 DIAGNOSIS — E538 Deficiency of other specified B group vitamins: Secondary | ICD-10-CM

## 2024-04-29 DIAGNOSIS — R4589 Other symptoms and signs involving emotional state: Secondary | ICD-10-CM

## 2024-04-29 MED ORDER — CYANOCOBALAMIN 1000 MCG/ML IJ SOLN
1000.0000 ug | Freq: Once | INTRAMUSCULAR | Status: AC
  Administered 2024-04-29: 1000 ug via INTRAMUSCULAR

## 2024-04-29 NOTE — Progress Notes (Signed)
 Patient is in office today for a nurse visit for B12 Injection. Patient Injection was given in the  Right deltoid. Patient tolerated injection well.

## 2024-04-29 NOTE — Telephone Encounter (Signed)
 Request to cancel appointment, wife said not needed

## 2024-04-30 ENCOUNTER — Ambulatory Visit: Admitting: Diagnostic Neuroimaging

## 2024-05-14 ENCOUNTER — Encounter: Payer: Self-pay | Admitting: Internal Medicine

## 2024-05-14 ENCOUNTER — Ambulatory Visit

## 2024-05-14 ENCOUNTER — Ambulatory Visit: Admitting: Internal Medicine

## 2024-05-14 VITALS — BP 135/60 | Ht 68.0 in | Wt 165.0 lb

## 2024-05-14 VITALS — BP 106/60 | HR 66 | Ht 68.0 in | Wt 172.0 lb

## 2024-05-14 DIAGNOSIS — Z87891 Personal history of nicotine dependence: Secondary | ICD-10-CM | POA: Diagnosis not present

## 2024-05-14 DIAGNOSIS — Z0000 Encounter for general adult medical examination without abnormal findings: Secondary | ICD-10-CM

## 2024-05-14 DIAGNOSIS — R0609 Other forms of dyspnea: Secondary | ICD-10-CM

## 2024-05-14 MED ORDER — ALBUTEROL SULFATE HFA 108 (90 BASE) MCG/ACT IN AERS: 1.0000 | INHALATION_SPRAY | RESPIRATORY_TRACT | 1 refills | Status: AC | PRN

## 2024-05-14 NOTE — Patient Instructions (Signed)
 Brandon [name],  Thank you for taking the time for your Medicare Wellness Visit. I appreciate your continued commitment to your health goals. Please review the care plan we discussed, and feel free to reach out if I can assist you further.  Please note that Annual Wellness Visits do not include a physical exam. Some assessments may be limited, especially if the visit was conducted virtually. If needed, we may recommend an in-person follow-up with your provider.  Ongoing Care Seeing your primary care provider every 3 to 6 months helps us  monitor your health and provide consistent, personalized care.   1 year follow up for Medicare well visit: FRIDAY May 16, 2025 AT 8:40 AM with medicare wellness nurse in office  Referrals If a referral was made during today's visit and you haven't received any updates within two weeks, please contact the referred provider directly to check on the status.  NONE PLACED TODAY.  PLEASE CALL YOUR CARDIOLOGIST, UROLOGIST, AND EYE DOCTOR TO SCHEDULE THOSE APPOINTMENTS  Recommended Screenings:  Health Maintenance  Topic Date Due   Pneumococcal Vaccine for age over 45 (1 of 2 - PCV) Never done   Zoster (Shingles) Vaccine (1 of 2) Never done   Medicare Annual Wellness Visit  10/05/2023   COVID-19 Vaccine (1 - 2025-26 season) Never done   DTaP/Tdap/Td vaccine (2 - Td or Tdap) 07/06/2030   Meningitis B Vaccine  Aged Out   Flu Shot  Discontinued   Hepatitis C Screening  Discontinued       05/14/2024    9:44 AM  Advanced Directives  Does Patient Have a Medical Advance Directive? No  Would patient like information on creating a medical advance directive? Yes (MAU/Ambulatory/Procedural Areas - Information given)    Vision: Annual vision screenings are recommended for early detection of glaucoma, cataracts, and diabetic retinopathy. These exams can also reveal signs of chronic conditions such as diabetes and high blood pressure.  Dental: Annual dental  screenings help detect early signs of oral cancer, gum disease, and other conditions linked to overall health, including heart disease and diabetes.  Please see the attached documents for additional preventive care recommendations.
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
## 2024-05-14 NOTE — Patient Instructions (Signed)
 Use your albuterol  as a rescue medication to be used if you can't catch your breath by resting, slowing your pace,  or doing a relaxed purse lip breathing pattern.  - The less you use it, the better it will work when you need it. - Ok to use up to 2 puffs  every 4 hours if you must but call for  appointment if use goes up over your usual need - Don't leave home without it !!  (think of it like a spare tire or starter fluid for your car)   Also  Ok to try albuterol  15 min before an activity (on alternating days)  that you know would usually make you short of breath and see if it makes any difference and if makes none then don't take albuterol  after activity unless you can't catch your breath as this means it's the resting that helps, not the albuterol .   Follow up can be as needed
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
## 2024-06-03 ENCOUNTER — Ambulatory Visit (INDEPENDENT_AMBULATORY_CARE_PROVIDER_SITE_OTHER)

## 2024-06-03 DIAGNOSIS — E538 Deficiency of other specified B group vitamins: Secondary | ICD-10-CM | POA: Diagnosis not present

## 2024-06-03 MED ORDER — CYANOCOBALAMIN 1000 MCG/ML IJ SOLN
1000.0000 ug | Freq: Once | INTRAMUSCULAR | Status: AC
  Administered 2024-06-03: 1000 ug via INTRAMUSCULAR

## 2024-06-03 NOTE — Progress Notes (Signed)
 Patient is in office today for a nurse visit for B12 Injection. Patient Injection was given in the  Right arm. Patient tolerated injection well.

## 2024-07-01 ENCOUNTER — Ambulatory Visit: Payer: Self-pay

## 2024-07-02 ENCOUNTER — Ambulatory Visit (INDEPENDENT_AMBULATORY_CARE_PROVIDER_SITE_OTHER)

## 2024-07-02 DIAGNOSIS — E538 Deficiency of other specified B group vitamins: Secondary | ICD-10-CM | POA: Diagnosis not present

## 2024-07-02 MED ORDER — CYANOCOBALAMIN 1000 MCG/ML IJ SOLN
1000.0000 ug | Freq: Once | INTRAMUSCULAR | Status: AC
  Administered 2024-07-02: 1000 ug via INTRAMUSCULAR

## 2024-07-02 NOTE — Progress Notes (Signed)
 Patient is in office today for a nurse visit for B12 Injection. Patient Injection was given in the  Right deltoid. Patient tolerated injection well.

## 2024-07-03 ENCOUNTER — Ambulatory Visit: Payer: Self-pay

## 2024-07-03 NOTE — Telephone Encounter (Signed)
 FYI Only or Action Required?: Action required by provider: request for appointment.  Patient was last seen in primary care on 03/12/2024 by Brandon Doffing, FNP.  Called Nurse Triage reporting Dizziness.  Symptoms began several months ago.  Interventions attempted: Prescription medications: Meclizine  .  Symptoms are: stable.  Triage Disposition: See PCP When Office is Open (Within 3 Days)  Patient/caregiver understands and will follow disposition?: Yes   Reason for Disposition  [1] MODERATE dizziness (e.g., vertigo; feels very unsteady, interferes with normal activities) AND [2] has been evaluated by doctor (or NP/PA) for this  Answer Assessment - Initial Assessment Questions BP 123/66 HR 67, pt reports BP fluctuates. Received b12 injection yesterday, feels like it worsened after getting the b12 injection but does not think its the reason for these symptoms. No appointment with PCP within dispo, please advise. Requesting call back [phone]   1. VERTIGO: Do you feel like either you or the room is spinning or tilting?      Sometimes  2. LIGHTHEADED: Do you feel lightheaded? (e.g., somewhat faint, woozy, weak upon standing)     Sometimes  3. SEVERITY: How bad is it?  Can you walk?     Yes can walk, sometimes feels like going to fall on face  4. ONSET:  When did the dizziness begin?     2 days ago  5. AGGRAVATING FACTORS: Does anything make it worse? (e.g., standing, change in head position)     Standing up, walking, changing head position, sometimes when lying down.   6. RECURRENT SYMPTOM: Have you had dizziness before? If Yes, ask: When was the last time? What happened that time?     Yes, around December 2025, PCP thought it might be vertigo, given meclizine  (ANTIVERT ) 25 MG tablet in July. Has not taken any in 2 months, but could not tell difference when taking  7. OTHER SYMPTOMS: Do you have any other symptoms? (e.g., earache, headache, numbness,  tinnitus, vomiting, weakness)     Mild headache  Protocols used: Dizziness - Vertigo-A-AH  Message from [place] G sent at 07/03/2024  9:40 AM EST  Reason for Triage: Dizziness with headache start 07/02/2024 after vitamin b 12 its getitng worse

## 2024-07-03 NOTE — Telephone Encounter (Signed)
Called patient -added to waitlist.

## 2024-07-30 ENCOUNTER — Ambulatory Visit: Payer: Self-pay

## 2024-08-23 ENCOUNTER — Ambulatory Visit: Admitting: Cardiovascular Disease

## 2024-09-10 ENCOUNTER — Ambulatory Visit

## 2024-10-11 ENCOUNTER — Ambulatory Visit

## 2025-05-16 ENCOUNTER — Ambulatory Visit: Payer: Self-pay
